# Patient Record
Sex: Female | Born: 1950 | Race: White | Hispanic: No | Marital: Single | Smoking: Former smoker
Health system: Southern US, Community
[De-identification: ages and names within clinical notes are randomized; demographics above are authoritative.]

## PROBLEM LIST (undated history)

## (undated) DIAGNOSIS — Z923 Personal history of irradiation: Secondary | ICD-10-CM

## (undated) DIAGNOSIS — I10 Essential (primary) hypertension: Secondary | ICD-10-CM

## (undated) DIAGNOSIS — R768 Other specified abnormal immunological findings in serum: Secondary | ICD-10-CM

## (undated) DIAGNOSIS — J40 Bronchitis, not specified as acute or chronic: Secondary | ICD-10-CM

## (undated) DIAGNOSIS — C50911 Malignant neoplasm of unspecified site of right female breast: Secondary | ICD-10-CM

## (undated) DIAGNOSIS — R0600 Dyspnea, unspecified: Secondary | ICD-10-CM

## (undated) DIAGNOSIS — J3089 Other allergic rhinitis: Secondary | ICD-10-CM

## (undated) DIAGNOSIS — I2089 Other forms of angina pectoris: Secondary | ICD-10-CM

## (undated) DIAGNOSIS — F329 Major depressive disorder, single episode, unspecified: Secondary | ICD-10-CM

## (undated) DIAGNOSIS — K219 Gastro-esophageal reflux disease without esophagitis: Secondary | ICD-10-CM

## (undated) DIAGNOSIS — F419 Anxiety disorder, unspecified: Secondary | ICD-10-CM

## (undated) DIAGNOSIS — I739 Peripheral vascular disease, unspecified: Secondary | ICD-10-CM

## (undated) DIAGNOSIS — I208 Other forms of angina pectoris: Secondary | ICD-10-CM

## (undated) DIAGNOSIS — M1990 Unspecified osteoarthritis, unspecified site: Secondary | ICD-10-CM

## (undated) DIAGNOSIS — G459 Transient cerebral ischemic attack, unspecified: Secondary | ICD-10-CM

## (undated) DIAGNOSIS — C801 Malignant (primary) neoplasm, unspecified: Secondary | ICD-10-CM

## (undated) DIAGNOSIS — F32A Depression, unspecified: Secondary | ICD-10-CM

## (undated) HISTORY — PX: CHOLECYSTECTOMY: SHX55

## (undated) HISTORY — DX: Other specified abnormal immunological findings in serum: R76.8

## (undated) HISTORY — PX: BREAST EXCISIONAL BIOPSY: SUR124

## (undated) HISTORY — PX: BREAST SURGERY: SHX581

## (undated) HISTORY — PX: APPENDECTOMY: SHX54

---

## 2000-01-23 DIAGNOSIS — Z923 Personal history of irradiation: Secondary | ICD-10-CM

## 2000-01-23 HISTORY — DX: Personal history of irradiation: Z92.3

## 2000-01-23 HISTORY — PX: BREAST LUMPECTOMY: SHX2

## 2004-02-09 ENCOUNTER — Ambulatory Visit: Payer: Self-pay | Admitting: Internal Medicine

## 2004-03-06 ENCOUNTER — Ambulatory Visit: Payer: Self-pay | Admitting: Internal Medicine

## 2004-03-27 ENCOUNTER — Ambulatory Visit: Payer: Self-pay | Admitting: Internal Medicine

## 2004-04-13 ENCOUNTER — Ambulatory Visit: Payer: Self-pay | Admitting: Oncology

## 2004-04-22 ENCOUNTER — Ambulatory Visit: Payer: Self-pay | Admitting: Oncology

## 2004-10-12 ENCOUNTER — Ambulatory Visit: Payer: Self-pay | Admitting: Oncology

## 2004-10-22 ENCOUNTER — Ambulatory Visit: Payer: Self-pay | Admitting: Oncology

## 2005-04-09 ENCOUNTER — Ambulatory Visit: Payer: Self-pay | Admitting: Oncology

## 2005-04-22 ENCOUNTER — Ambulatory Visit: Payer: Self-pay | Admitting: Oncology

## 2005-05-22 ENCOUNTER — Ambulatory Visit: Payer: Self-pay | Admitting: Oncology

## 2005-10-05 ENCOUNTER — Ambulatory Visit: Payer: Self-pay | Admitting: Oncology

## 2006-07-29 ENCOUNTER — Ambulatory Visit: Payer: Self-pay | Admitting: Internal Medicine

## 2007-08-06 ENCOUNTER — Ambulatory Visit: Payer: Self-pay | Admitting: Internal Medicine

## 2008-09-13 ENCOUNTER — Ambulatory Visit: Payer: Self-pay | Admitting: Internal Medicine

## 2008-09-15 ENCOUNTER — Ambulatory Visit: Payer: Self-pay | Admitting: Internal Medicine

## 2009-09-27 ENCOUNTER — Ambulatory Visit: Payer: Self-pay | Admitting: Internal Medicine

## 2010-10-23 ENCOUNTER — Ambulatory Visit: Payer: Self-pay | Admitting: Internal Medicine

## 2011-10-29 ENCOUNTER — Ambulatory Visit: Payer: Self-pay | Admitting: Internal Medicine

## 2012-12-16 ENCOUNTER — Ambulatory Visit: Payer: Self-pay | Admitting: Internal Medicine

## 2013-07-14 ENCOUNTER — Emergency Department (HOSPITAL_COMMUNITY): Payer: BC Managed Care – PPO

## 2013-07-14 ENCOUNTER — Encounter (HOSPITAL_COMMUNITY): Payer: Self-pay | Admitting: Radiology

## 2013-07-14 ENCOUNTER — Inpatient Hospital Stay (HOSPITAL_COMMUNITY)
Admission: EM | Admit: 2013-07-14 | Discharge: 2013-07-18 | DRG: 640 | Disposition: A | Discharge status: Home / Self Care | Payer: BC Managed Care – PPO | Attending: Internal Medicine | Admitting: Internal Medicine

## 2013-07-14 DIAGNOSIS — E871 Hypo-osmolality and hyponatremia: Secondary | ICD-10-CM

## 2013-07-14 DIAGNOSIS — F10930 Alcohol use, unspecified with withdrawal, uncomplicated: Secondary | ICD-10-CM

## 2013-07-14 DIAGNOSIS — Z853 Personal history of malignant neoplasm of breast: Secondary | ICD-10-CM

## 2013-07-14 DIAGNOSIS — E876 Hypokalemia: Secondary | ICD-10-CM | POA: Diagnosis present

## 2013-07-14 DIAGNOSIS — R4701 Aphasia: Secondary | ICD-10-CM

## 2013-07-14 DIAGNOSIS — E870 Hyperosmolality and hypernatremia: Secondary | ICD-10-CM

## 2013-07-14 DIAGNOSIS — F10939 Alcohol use, unspecified with withdrawal, unspecified: Secondary | ICD-10-CM

## 2013-07-14 DIAGNOSIS — R4789 Other speech disturbances: Secondary | ICD-10-CM | POA: Diagnosis present

## 2013-07-14 DIAGNOSIS — F10230 Alcohol dependence with withdrawal, uncomplicated: Secondary | ICD-10-CM

## 2013-07-14 DIAGNOSIS — F17200 Nicotine dependence, unspecified, uncomplicated: Secondary | ICD-10-CM | POA: Diagnosis present

## 2013-07-14 DIAGNOSIS — F10239 Alcohol dependence with withdrawal, unspecified: Secondary | ICD-10-CM

## 2013-07-14 DIAGNOSIS — F1010 Alcohol abuse, uncomplicated: Secondary | ICD-10-CM

## 2013-07-14 DIAGNOSIS — G9340 Encephalopathy, unspecified: Secondary | ICD-10-CM | POA: Diagnosis present

## 2013-07-14 DIAGNOSIS — R569 Unspecified convulsions: Secondary | ICD-10-CM | POA: Diagnosis present

## 2013-07-14 DIAGNOSIS — F1020 Alcohol dependence, uncomplicated: Secondary | ICD-10-CM | POA: Diagnosis present

## 2013-07-14 DIAGNOSIS — Z9221 Personal history of antineoplastic chemotherapy: Secondary | ICD-10-CM

## 2013-07-14 DIAGNOSIS — R4182 Altered mental status, unspecified: Secondary | ICD-10-CM

## 2013-07-14 DIAGNOSIS — I10 Essential (primary) hypertension: Secondary | ICD-10-CM | POA: Diagnosis present

## 2013-07-14 HISTORY — DX: Malignant (primary) neoplasm, unspecified: C80.1

## 2013-07-14 HISTORY — DX: Essential (primary) hypertension: I10

## 2013-07-14 HISTORY — DX: Malignant neoplasm of unspecified site of right female breast: C50.911

## 2013-07-14 LAB — RAPID URINE DRUG SCREEN, HOSP PERFORMED
AMPHETAMINES: NOT DETECTED
Barbiturates: NOT DETECTED
Benzodiazepines: NOT DETECTED
COCAINE: NOT DETECTED
Opiates: NOT DETECTED
TETRAHYDROCANNABINOL: NOT DETECTED

## 2013-07-14 LAB — BASIC METABOLIC PANEL
BUN: 6 mg/dL (ref 6–23)
CO2: 18 mEq/L — ABNORMAL LOW (ref 19–32)
Calcium: 8.5 mg/dL (ref 8.4–10.5)
Chloride: 71 mEq/L — ABNORMAL LOW (ref 96–112)
Creatinine, Ser: 0.28 mg/dL — ABNORMAL LOW (ref 0.50–1.10)
Glucose, Bld: 95 mg/dL (ref 70–99)
POTASSIUM: 3.3 meq/L — AB (ref 3.7–5.3)
Sodium: 109 mEq/L — CL (ref 137–147)

## 2013-07-14 LAB — URINE MICROSCOPIC-ADD ON

## 2013-07-14 LAB — DIFFERENTIAL
BASOS PCT: 0 % (ref 0–1)
Basophils Absolute: 0 10*3/uL (ref 0.0–0.1)
EOS PCT: 0 % (ref 0–5)
Eosinophils Absolute: 0 10*3/uL (ref 0.0–0.7)
Lymphocytes Relative: 9 % — ABNORMAL LOW (ref 12–46)
Lymphs Abs: 0.7 10*3/uL (ref 0.7–4.0)
MONO ABS: 0.7 10*3/uL (ref 0.1–1.0)
Monocytes Relative: 9 % (ref 3–12)
NEUTROS ABS: 6.5 10*3/uL (ref 1.7–7.7)
Neutrophils Relative %: 82 % — ABNORMAL HIGH (ref 43–77)

## 2013-07-14 LAB — CBC
HEMATOCRIT: 37.9 % (ref 36.0–46.0)
Hemoglobin: 14 g/dL (ref 12.0–15.0)
MCH: 32.8 pg (ref 26.0–34.0)
MCHC: 37.9 g/dL — AB (ref 30.0–36.0)
MCV: 86.7 fL (ref 78.0–100.0)
Platelets: 253 10*3/uL (ref 150–400)
RBC: 4.37 MIL/uL (ref 3.87–5.11)
RDW: 12.3 % (ref 11.5–15.5)
WBC: 7.9 10*3/uL (ref 4.0–10.5)

## 2013-07-14 LAB — PROTIME-INR
INR: 0.98 (ref 0.00–1.49)
PROTHROMBIN TIME: 12.8 s (ref 11.6–15.2)

## 2013-07-14 LAB — COMPREHENSIVE METABOLIC PANEL
ALBUMIN: 4.1 g/dL (ref 3.5–5.2)
ALT: 31 U/L (ref 0–35)
AST: 45 U/L — ABNORMAL HIGH (ref 0–37)
Alkaline Phosphatase: 111 U/L (ref 39–117)
BUN: 7 mg/dL (ref 6–23)
CO2: 27 mEq/L (ref 19–32)
CREATININE: 0.35 mg/dL — AB (ref 0.50–1.10)
Calcium: 9.5 mg/dL (ref 8.4–10.5)
Chloride: <65 mEq/L — CL (ref 96–112)
GFR calc Af Amer: >90 mL/min (ref >90)
GFR calc non Af Amer: >90 mL/min (ref >90)
Glucose, Bld: 121 mg/dL — ABNORMAL HIGH (ref 70–99)
Potassium: 3.3 mEq/L — ABNORMAL LOW (ref 3.7–5.3)
Sodium: 105 mEq/L — CL (ref 137–147)
Total Bilirubin: 1 mg/dL (ref 0.3–1.2)
Total Protein: 7.7 g/dL (ref 6.0–8.3)

## 2013-07-14 LAB — URINALYSIS, ROUTINE W REFLEX MICROSCOPIC
BILIRUBIN URINE: NEGATIVE
Glucose, UA: NEGATIVE mg/dL
KETONES UR: 15 mg/dL — AB
Nitrite: NEGATIVE
PROTEIN: 100 mg/dL — AB
Specific Gravity, Urine: 1.016 (ref 1.005–1.030)
Urobilinogen, UA: 1 mg/dL (ref 0.0–1.0)
pH: 7 (ref 5.0–8.0)

## 2013-07-14 LAB — CREATININE, URINE, RANDOM: Creatinine, Urine: 49.72 mg/dL

## 2013-07-14 LAB — ETHANOL: Alcohol, Ethyl (B): <11 mg/dL (ref 0–11)

## 2013-07-14 LAB — I-STAT TROPONIN, ED: Troponin i, poc: 0 ng/mL (ref 0.00–0.08)

## 2013-07-14 LAB — APTT: APTT: 29 s (ref 24–37)

## 2013-07-14 LAB — SODIUM, URINE, RANDOM: Sodium, Ur: 36 mEq/L

## 2013-07-14 MED ORDER — SODIUM CHLORIDE 0.9 % IV BOLUS (SEPSIS)
1000.0000 mL | Freq: Once | INTRAVENOUS | Status: AC
Start: 2013-07-14 — End: 2013-07-14
  Administered 2013-07-14: 1000 mL via INTRAVENOUS

## 2013-07-14 MED ORDER — FOLIC ACID 5 MG/ML IJ SOLN
1.0000 mg | Freq: Every day | INTRAMUSCULAR | Status: DC
  Administered 2013-07-14 – 2013-07-18 (×5): 1 mg via INTRAVENOUS
  Filled 2013-07-14 (×5): qty 0.2

## 2013-07-14 MED ORDER — SODIUM CHLORIDE 0.9 % IV BOLUS (SEPSIS)
1000.0000 mL | Freq: Once | INTRAVENOUS | Status: AC
  Administered 2013-07-14: 1000 mL via INTRAVENOUS

## 2013-07-14 MED ORDER — HEPARIN SODIUM (PORCINE) 5000 UNIT/ML IJ SOLN
5000.0000 [IU] | Freq: Three times a day (TID) | INTRAMUSCULAR | Status: DC
  Administered 2013-07-14 – 2013-07-15 (×2): 5000 [IU] via SUBCUTANEOUS
  Filled 2013-07-14 (×5): qty 1

## 2013-07-14 MED ORDER — ALTEPLASE (STROKE) FULL DOSE INFUSION
0.9000 mg/kg | Freq: Once | INTRAVENOUS | Status: DC
  Filled 2013-07-14: qty 52

## 2013-07-14 MED ORDER — MIDAZOLAM HCL 2 MG/2ML IJ SOLN
INTRAMUSCULAR | Status: AC
  Administered 2013-07-14: 1 mg
  Filled 2013-07-14: qty 2

## 2013-07-14 MED ORDER — SODIUM CHLORIDE 3 % IV SOLN
INTRAVENOUS | Status: DC
Start: 2013-07-15 — End: 2013-07-14
  Filled 2013-07-14: qty 500

## 2013-07-14 MED ORDER — SODIUM CHLORIDE 0.9 % IV SOLN: 250.0000 mL | INTRAVENOUS | Status: DC | PRN

## 2013-07-14 MED ORDER — FENTANYL CITRATE 0.05 MG/ML IJ SOLN
50.0000 ug | Freq: Once | INTRAMUSCULAR | Status: AC
  Administered 2013-07-15: 50 ug via INTRAVENOUS
  Filled 2013-07-14: qty 2

## 2013-07-14 MED ORDER — THIAMINE HCL 100 MG/ML IJ SOLN
100.0000 mg | Freq: Once | INTRAMUSCULAR | Status: AC
  Administered 2013-07-14: 100 mg via INTRAVENOUS
  Filled 2013-07-14: qty 2

## 2013-07-14 MED ORDER — SODIUM CHLORIDE 3 % IV SOLN
INTRAVENOUS | Status: DC
  Filled 2013-07-14: qty 500

## 2013-07-14 MED ORDER — MIDAZOLAM HCL 2 MG/2ML IJ SOLN
1.0000 mg | Freq: Once | INTRAMUSCULAR | Status: AC
  Administered 2013-07-15: 1 mg via INTRAVENOUS
  Filled 2013-07-14: qty 2

## 2013-07-14 MED ORDER — DESMOPRESSIN ACETATE 4 MCG/ML IJ SOLN
1.0000 ug | Freq: Two times a day (BID) | INTRAMUSCULAR | Status: DC
  Administered 2013-07-15 (×2): 1 ug via INTRAVENOUS
  Filled 2013-07-14 (×4): qty 0.25

## 2013-07-14 MED ORDER — FENTANYL CITRATE 0.05 MG/ML IJ SOLN
INTRAMUSCULAR | Status: AC
  Administered 2013-07-14: 50 ug
  Filled 2013-07-14: qty 2

## 2013-07-14 MED ORDER — SODIUM CHLORIDE 3 % IV SOLN: INTRAVENOUS | Status: DC

## 2013-07-14 NOTE — ED Notes (Signed)
Critical care physian at bedside

## 2013-07-14 NOTE — ED Notes (Signed)
i-stat chem 8 result given to [facility]

## 2013-07-14 NOTE — ED Notes (Signed)
Pt to ED via [place] with c/o Code Stroke.  EMS reports pt was normal at  15:30 and then speech became garbled.

## 2013-07-14 NOTE — Procedures (Signed)
Central Venous Catheter Insertion Procedure Note VANIYAH LANSKY [id number] 01-Apr-1950  Procedure: Insertion of Central Venous Catheter Indications: Drug and/or fluid administration  Procedure Details Consent: Unable to obtain consent because of altered level of consciousness. Time Out: Verified patient identification, verified procedure, site/side was marked, verified correct patient position, special equipment/implants available, medications/allergies/relevent history reviewed, required imaging and test results available.  Performed  Maximum sterile technique was used including antiseptics, cap, gloves, gown, hand hygiene, mask and sheet. Skin prep: Chlorhexidine; local anesthetic administered A antimicrobial bonded/coated triple lumen catheter was placed in the left internal jugular vein using the Seldinger technique. Ultrasound guidance used.yes Catheter placed to 18 cm. Blood aspirated via all 3 ports and then flushed x 3. Line sutured x 2 and dressing applied.  Evaluation Blood flow good Complications: No apparent complications Patient did tolerate procedure well. Chest X-ray ordered to verify placement.  CXR: pending.  Georgann Housekeeper, ACNP [facility] Pager [phone] or [phone]   Baltazar Apo, MD, PhD 07/15/2013, 12:25 AM [facility] [phone] or if no answer [phone]

## 2013-07-14 NOTE — Code Documentation (Signed)
63 year old female presents to ED via EMS as code stroke.  EMS reports she was LSW at 1430 with sudden onset of garbled speech and not following commands.  They report prior to that she was asking for food.  No family present - unable to find any medical records in our system. Patient is aphasic and unable to tell us anything.  She is MAEx4 - strong - garbled speech - unable to follow commands. Has gaze preference to the left with complete  field cut - will turn head to voice from right side.  Initial NIHSS 12.  After contacting family they state she is heavy drinker - 6-12 beers per day - also add she has been sick with vomiting since Sunday - only had 2 beers since Sunday - unable to keep food down.  She was not "herself" at a family reunion on Saturday.  Called for tPA - Dr. Aram Beecham speaking with family who have just arrived.   After speaking with people who witnessed the event - they report she asked for something to eat - then they heard a noise - looked over at her sitting a table - her right hand was jerking and hitting the table - she was unable to speak and she had urine incontinence.  She is becoming less agiated - able to speak some words - still confused - able to follow a few simple commands - still with garbled speech.  Her Na results show level of 105.  Dr. Aram Beecham requests to hold tPA.  NIHSS 9.  Handoff to [employer].

## 2013-07-14 NOTE — H&P (Signed)
PULMONARY / CRITICAL CARE MEDICINE   Name: Joan [name] MRN: [id number] DOB: 10-09-1950    ADMISSION DATE:  07/14/2013 CONSULTATION DATE:  07/14/2013  REFERRING MD :  EDP PRIMARY SERVICE: PCCM  CHIEF COMPLAINT:  Hyponatremia  BRIEF PATIENT DESCRIPTION: 63 year old female with PMH of hypertension, breast Ca, and alcohol abuse presents to [facility] ED 6/23 with 3 day history of progressive AMS. In ED found to have Na 105. PCCM asked to see for ICU admission.  SIGNIFICANT EVENTS / STUDIES:  6/23 CT head - Linear hyperdensity not far from the left foramen of Monro. This could be a venous angioma or conceivably a thrombosed vessel involving the left anterior circulation. Consider MRI or CT angiography for further characterization. 6/23 MRI brain >>>  LINES / TUBES: PIV  CULTURES: None  ANTIBIOTICS: None  HISTORY OF PRESENT ILLNESS:  63 year old female with hx of HTN and breast Ca (resection and chemo, last dose reportedly 4 years ago). She is a current smoker and reportedly drinks 6-12 beers daily. 6/21 began to have some alterations in mental status in that she was fatigued, and quiet. This continued to worsen. 6/23 began to have slurred speech and was brought to ED. In ED was alert and intermittently following commands, she was noted to have slight gaze deviation to left and slight R sided weakness by EDP. She was hemodynamically stable, and received 2L boluses of NS. She went to CT which was abnormal as above. MRI ordered in ED. Chemistry resulted with sodium 105. PCCM asked to consult for ICU admission.   PAST MEDICAL HISTORY :  Past Medical History  Diagnosis Date  . Hypertension   . Cancer    Past Surgical History  Procedure Laterality Date  . Cholecystectomy    . Breast surgery     Prior to Admission medications   Medication Sig Start Date End Date Taking? Authorizing Provider  lisinopril (PRINIVIL,ZESTRIL) 20 MG tablet Take 20 mg by mouth daily. 07/02/13  Yes Historical  Provider, MD   No Known Allergies  FAMILY HISTORY:  No family history on file. SOCIAL HISTORY:  reports that she has been smoking.  She does not have any smokeless tobacco history on file. She reports that she drinks alcohol. She reports that she does not use illicit drugs.  REVIEW OF SYSTEMS:  Unable due to delerium  SUBJECTIVE:   VITAL SIGNS: Temp:  [98.6 F (37 C)] 98.6 F (37 C) (06/23 2019) Pulse Rate:  [84-86] 84 (06/23 1942) Resp:  [17-18] 18 (06/23 2110) BP: (142-163)/(83-147) 162/83 mmHg (06/23 2110) SpO2:  [98 %-100 %] 98 % (06/23 2110) Weight:  [57.607 kg (127 lb)] 57.607 kg (127 lb) (06/23 1852) HEMODYNAMICS:   VENTILATOR SETTINGS:   INTAKE / OUTPUT: Intake/Output     06/23 0701 - 06/24 0700   I.V. (mL/kg) 1000 (17.4)   Total Intake(mL/kg) 1000 (17.4)   Net +1000         PHYSICAL EXAMINATION: General:  Female resting in bed in NAD Neuro:  Spontaneously alert, disoriented, speech does not make sense HEENT:  /AT, PERRL, no JVD noted Cardiovascular:  RRR Lungs:  Clear anteriorly Abdomen:  Soft, non-tender Musculoskeletal:  No acute deformity, no edema noted Skin:  Intact  LABS:  CBC  Recent Labs Lab 07/14/13 1803  WBC 7.9  HGB 14.0  HCT 37.9  PLT 253   Coag's  Recent Labs Lab 07/14/13 1803  APTT 29  INR 0.98   BMET  Recent Labs Lab  07/14/13 1803  NA 105*  K 3.3*  CL <65*  CO2 27  BUN 7  CREATININE 0.35*  GLUCOSE 121*   Electrolytes  Recent Labs Lab 07/14/13 1803  CALCIUM 9.5   Sepsis Markers No results found for this basename: LATICACIDVEN, PROCALCITON, O2SATVEN,  in the last 168 hours ABG No results found for this basename: PHART, PCO2ART, PO2ART,  in the last 168 hours Liver Enzymes  Recent Labs Lab 07/14/13 1803  AST 45*  ALT 31  ALKPHOS 111  BILITOT 1.0  ALBUMIN 4.1   Cardiac Enzymes No results found for this basename: TROPONINI, PROBNP,  in the last 168 hours Glucose No results found for this  basename: GLUCAP,  in the last 168 hours  Drug Screen and ETOH level negative on admission.  Imaging Ct Head Wo Contrast  07/14/2013   CLINICAL DATA:  Left 4 days.  Vomiting.  Slurred speech.  EXAM: CT HEAD WITHOUT CONTRAST  TECHNIQUE: Contiguous axial images were obtained from the base of the skull through the vertex without intravenous contrast.  COMPARISON:  Report from 11/26/2002  FINDINGS: Small hyperdense structure medially in the left frontal lobe near the foramen of Monro as on image 11 of series 4.  The brainstem, cerebellum, cerebral peduncle is, thalami, basal ganglia, ventricular system, and basilar cisterns appear otherwise unremarkable.  Periventricular white matter and corona radiata hypodensities favor chronic ischemic microvascular white matter disease. No specific parenchymal signs acute hemorrhage, CVA, or mass.  IMPRESSION: 1. Bili finding of note is a linear hyperdensity not far from the left foramen of Monro. This could be a venous angioma or conceivably a thrombosed vessel involving the left anterior circulation. I doubt this is local blood products. Consider MRI or CT angiography for further characterization. 2. Periventricular white matter and corona radiata hypodensities favor chronic ischemic microvascular white matter disease.  These results were called by telephone at the time of interpretation on 07/14/2013 at 6:41 PM to Dr. Armida Sans, who verbally acknowledged these results.   Electronically Signed   By: Sherryl Barters M.D.   On: 07/14/2013 18:44   Dg Chest Portable 1 View  07/14/2013   CLINICAL DATA:  Code stroke  EXAM: PORTABLE CHEST - 1 VIEW  COMPARISON:  None.  FINDINGS: The heart size and mediastinal contours are within normal limits. Both lungs are clear. The visualized skeletal structures are unremarkable.  IMPRESSION: No active disease.   Electronically Signed   By: Lucienne Capers M.D.   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  BMP q2 hours Desmopressin BID Free water restriction Na Goal 109-111 by 6/24 @ 2200 Check serum osmo, urine sodium  GASTROINTESTINAL A:   ETOH abuse (12 pack per day) GI ppx  P:   PPI Free water restriction  HEMATOLOGIC A:   Hx breast Ca VTE ppx  P:  Follow CBC SQ Heparin  INFECTIOUS A:   No acute evidence of infection  P:   Follow WBC and fever curves  ENDOCRINE A:   No acute issues    P:   CBG and  SSI  NEUROLOGIC A:   Acute encephalopathy 2nd to hyponatremia High risk for ETOH withdrawal  P:   CIWA assessment Monitor  Georgann Housekeeper, ACNP [facility] Pager [phone] or [phone]   I have personally obtained a history, examined the patient, evaluated laboratory and imaging results, formulated the assessment and plan and placed orders.  CRITICAL CARE: The patient is critically ill with multiple organ systems failure and requires high complexity decision making for assessment and support, frequent evaluation and titration of therapies, application of advanced monitoring technologies and extensive interpretation of multiple databases. Critical Care Time devoted to patient care services described in this note is 60 minutes.   Attending MD:  I have seen and examined the patient with P. Hoffman. She was seen by me 6/23. This note edited and signed 6/24. I agree with the data, assessment and plans above.  She has severe hyponatremia and encephalopathy. No evidence seizures thus far. Goal Na correction is 111 until ~18:00 on 6/24. She is stable from hemodynamic and resp  standpoint currently. Not she is at high risk for EtOH withdrawal.   Baltazar Apo, MD, PhD 07/15/2013, 12:15 AM [facility] [phone] or if no answer [phone]

## 2013-07-14 NOTE — ED Notes (Signed)
Pt remains restless attempts to sit upright and grasps at lines able to redirect pt to lie down and moved hands away from lines

## 2013-07-14 NOTE — Consult Note (Signed)
Referring Physician: ED    Chief Complaint: CODE STROKE confusion, aphasia.  HPI:                                                                                                                                         Joan [name] is an 63 y.o. female with a past medical history significant for HTN, alcoholism, and breast cancer, brought to the ED by ambulance as a code stroke due to the above stated symptoms. Patient has been sick for the past couple of days vomiting, no eating well, and no able to drink as she normally does.  Family said that she has been confused, no being herself, and today she was at the group home and noted to be confused, with a blank stare, having repetitive jerkiness of the right hand, and was incontinent of urine. Ambulance was called and they found her with garbled speech, no able to follow commands. Initial NIHSS 12. CT brain showed no acute intracranial abnormality. Na 105 Date last known well: 07/14/13 Time last known well: 230 pm  tPA Given: no, severe hyponatremia with seizures. NIHSS: 12   Past Medical History  Diagnosis Date  . Hypertension   . Cancer     Past Surgical History  Procedure Laterality Date  . Cholecystectomy    . Breast surgery      No family history on file. Social History:  reports that she has been smoking.  She does not have any smokeless tobacco history on file. She reports that she drinks alcohol. She reports that she does not use illicit drugs.  Allergies: No Known Allergies  Medications:                                                                                                                           I have reviewed the patient's current medications.  ROS:  Unable to obtain due to mental status  History obtained from chart review and family  Physical exam: no apparent distress.  Blood pressure 163/147, pulse 86, temperature 98.6 F (37 C), temperature source Oral, resp. rate 18, height _0  (1.651 m), weight 57.607 kg (127 lb), SpO2 99.00%. Head: normocephalic. Neck: supple, no bruits, no JVD. Cardiac: no murmurs. Lungs: clear. Abdomen: soft, no tender, no mass. Extremities: no edema. Neurologic Examination:                                                                                                      Mental Status: Awake, follows simple commands inconsistently. Cranial Nerves: II: Discs flat bilaterally; Visual fields grossly normal, pupils equal, round, reactive to light and accommodation III,IV, VI: ptosis not present, extra-ocular motions intact bilaterally V,VII: smile symmetric, facial light touch sensation normal bilaterally VIII: hearing normal bilaterally IX,X: gag reflex present XI: bilateral shoulder shrug XII: midline tongue extension without atrophy or fasciculations Motor: Moves all limbs symmetrically Tone and bulk:normal tone throughout; no atrophy noted Sensory: reacts to pain bilaterally Deep Tendon Reflexes:  Right: Upper Extremity   Left: Upper extremity   biceps (C-5 to C-6) 2/4   biceps (C-5 to C-6) 2/4 tricep (C7) 2/4    triceps (C7) 2/4 Brachioradialis (C6) 2/4  Brachioradialis (C6) 2/4  Lower Extremity Lower Extremity  quadriceps (L-2 to L-4) 2/4   quadriceps (L-2 to L-4) 2/4 Achilles (S1) 2/4   Achilles (S1) 2/4 Plantars: Right: downgoing   Left: downgoing Cerebellar: Unable to test Gait: unable to test No meningeal signs.    Results for orders placed during the hospital encounter of 07/14/13 (from the past 48 hour(s))  ETHANOL     Status: None   Collection Time    07/14/13  6:03 PM      Result Value Ref Range   Alcohol, Ethyl (B) <11  0 - 11 mg/dL   Comment:            LOWEST DETECTABLE LIMIT FOR     SERUM ALCOHOL IS 11 mg/dL     FOR MEDICAL PURPOSES ONLY  PROTIME-INR     Status: None   Collection Time     07/14/13  6:03 PM      Result Value Ref Range   Prothrombin Time 12.8  11.6 - 15.2 seconds   INR 0.98  0.00 - 1.49  APTT     Status: None   Collection Time    07/14/13  6:03 PM      Result Value Ref Range   aPTT 29  24 - 37 seconds  CBC     Status: Abnormal   Collection Time    07/14/13  6:03 PM      Result Value Ref Range   WBC 7.9  4.0 - 10.5 K/uL   RBC 4.37  3.87 - 5.11 MIL/uL   Hemoglobin 14.0  12.0 - 15.0 g/dL   HCT 37.9  36.0 - 46.0 %   MCV 86.7  78.0 - 100.0 fL   MCH 32.8  26.0 - 34.0 pg   MCHC 37.9 (*) 30.0 - 36.0  g/dL   Comment: RULED OUT INTERFERING SUBSTANCES   RDW 12.3  11.5 - 15.5 %   Platelets 253  150 - 400 K/uL  DIFFERENTIAL     Status: Abnormal   Collection Time    07/14/13  6:03 PM      Result Value Ref Range   Neutrophils Relative % 82 (*) 43 - 77 %   Neutro Abs 6.5  1.7 - 7.7 K/uL   Lymphocytes Relative 9 (*) 12 - 46 %   Lymphs Abs 0.7  0.7 - 4.0 K/uL   Monocytes Relative 9  3 - 12 %   Monocytes Absolute 0.7  0.1 - 1.0 K/uL   Eosinophils Relative 0  0 - 5 %   Eosinophils Absolute 0.0  0.0 - 0.7 K/uL   Basophils Relative 0  0 - 1 %   Basophils Absolute 0.0  0.0 - 0.1 K/uL  COMPREHENSIVE METABOLIC PANEL     Status: Abnormal   Collection Time    07/14/13  6:03 PM      Result Value Ref Range   Sodium 105 (*) 137 - 147 mEq/L   Comment: CRITICAL RESULT CALLED TO, READ BACK BY AND VERIFIED WITH:     M SCRUGGS,RN 1902 07/14/13 WBOND   Potassium 3.3 (*) 3.7 - 5.3 mEq/L   Chloride <65 (*) 96 - 112 mEq/L   Comment: CRITICAL RESULT CALLED TO, READ BACK BY AND VERIFIED WITH:     M SCRUGGS,RN 1902 07/14/13 WBOND   CO2 27  19 - 32 mEq/L   Glucose, Bld 121 (*) 70 - 99 mg/dL   BUN 7  6 - 23 mg/dL   Creatinine, Ser 0.35 (*) 0.50 - 1.10 mg/dL   Calcium 9.5  8.4 - 10.5 mg/dL   Total Protein 7.7  6.0 - 8.3 g/dL   Albumin 4.1  3.5 - 5.2 g/dL   AST 45 (*) 0 - 37 U/L   ALT 31  0 - 35 U/L   Alkaline Phosphatase 111  39 - 117 U/L   Total Bilirubin 1.0  0.3 - 1.2  mg/dL   GFR calc non Af Amer >90  >90 mL/min   GFR calc Af Amer >90  >90 mL/min   Comment: (NOTE)     The eGFR has been calculated using the CKD EPI equation.     This calculation has not been validated in all clinical situations.     eGFR's persistently <90 mL/min signify possible Chronic Kidney     Disease.  Randolm Idol, ED     Status: None   Collection Time    07/14/13  6:07 PM      Result Value Ref Range   Troponin i, poc 0.00  0.00 - 0.08 ng/mL   Comment 3            Comment: Due to the release kinetics of cTnI,     a negative result within the first hours     of the onset of symptoms does not rule out     myocardial infarction with certainty.     If myocardial infarction is still suspected,     repeat the test at appropriate intervals.   Ct Head Wo Contrast  07/14/2013   CLINICAL DATA:  Left 4 days.  Vomiting.  Slurred speech.  EXAM: CT HEAD WITHOUT CONTRAST  TECHNIQUE: Contiguous axial images were obtained from the base of the skull through the vertex without intravenous contrast.  COMPARISON:  Report from 11/26/2002  FINDINGS: Small hyperdense structure medially in the left frontal lobe near the foramen of Monro as on image 11 of series 4.  The brainstem, cerebellum, cerebral peduncle is, thalami, basal ganglia, ventricular system, and basilar cisterns appear otherwise unremarkable.  Periventricular white matter and corona radiata hypodensities favor chronic ischemic microvascular white matter disease. No specific parenchymal signs acute hemorrhage, CVA, or mass.  IMPRESSION: 1. Bili finding of note is a linear hyperdensity not far from the left foramen of Monro. This could be a venous angioma or conceivably a thrombosed vessel involving the left anterior circulation. I doubt this is local blood products. Consider MRI or CT angiography for further characterization. 2. Periventricular white matter and corona radiata hypodensities favor chronic ischemic microvascular white matter  disease.  These results were called by telephone at the time of interpretation on 07/14/2013 at 6:41 PM to Dr. Armida Sans, who verbally acknowledged these results.   Electronically Signed   By: Sherryl Barters M.D.   On: 07/14/2013 18:44     Assessment: 63 y.o. female with altered mental status, severe hyponatremia. Most likely had a provoked seizure at home in the setting of severe hyponatremia and withdraw from alcohol. History of breast cancer, can not entirely exclude brain mets. Admit to medicine. EEG to ruled out NCSE. MRI brain. Correct hyponatremia. Will follow up.  Dorian Pod, MD [facility] [phone]  07/14/2013, 7:08 PM

## 2013-07-14 NOTE — ED Notes (Signed)
Pt gold necklace placed in urine cup and pt sticker placed on cup- remains at bedside.

## 2013-07-14 NOTE — ED Provider Notes (Signed)
63 year old female was brought in to the ED as a code stroke. Initial report was that she was talking normally and then had sudden onset at 3:30 PM of garbled speech. However, once family arrived, a more complete history was obtained. She was at a family function in 3 days ago at which time she was unusually quiet. Diet she was taken home and slept most of the next day. Since then, she has been weak and somewhat shaky. Speech got considerably worse during the course of today. Family does relate that she drinks 6-12 beers a day and smokes at least a pack of cigarettes a day. She has a history of breast cancer which was treated with resection of tumor and chemotherapy and she has been cancer free for 4 years. Family states that she does not use illicit drugs. On exam, she is awake and alert and will follow some commands but will not consistently follow commands. There is a slight gaze preference to the left and she is slightly weaker on right compared with the left. Lungs are clear and heart has regular rate and rhythm. There no carotid bruits. Sodium has come back 105 without any other significant findings. She does take some medication for blood pressure but is not sure what it is. Severe hyponatremia probably accounts for all of her symptoms although there certainly could be some component of Wernicke's encephalopathy. She'll be given thiamine and saline. A urine sodium will be checked to evaluate for SIADH. CT did not show any definite pathology but she will be sent for MRI to make sure that there are no cerebral metastases that might account for some of her symptoms. She will need to be admitted to the medicine service.  CRITICAL CARE Performed by: Delora Fuel Total critical care time: 55 minutes Critical care time was exclusive of separately billable procedures and treating other patients. Critical care was necessary to treat or prevent imminent or life-threatening deterioration. Critical care was time  spent personally by me on the following activities: development of treatment plan with patient and/or surrogate as well as nursing, discussions with consultants, evaluation of patient's response to treatment, examination of patient, obtaining history from patient or surrogate, ordering and performing treatments and interventions, ordering and review of laboratory studies, ordering and review of radiographic studies, pulse oximetry and re-evaluation of patient's condition.  I saw and evaluated the patient, reviewed the resident's note and I agree with the findings and plan.   EKG Interpretation   Date/Time:  Tuesday July 14 2013 18:33:23 EDT Ventricular Rate:  93 PR Interval:  219 QRS Duration: 93 QT Interval:  413 QTC Calculation: 514 R Axis:   85 Text Interpretation:  Sinus rhythm Prolonged PR interval Biatrial  enlargement Borderline right axis deviation Repol abnrm suggests ischemia,  lateral leads Prolonged QT interval Baseline wander in lead(s) I III aVL  No old tracing to compare Confirmed by [facility]  MD, DAVID ([id number]) on  07/14/2013 6:41:00 PM         Delora Fuel, MD 49/20/10 0712

## 2013-07-14 NOTE — ED Notes (Signed)
Dr. Gardner at bedside 

## 2013-07-14 NOTE — ED Notes (Addendum)
Hattie Sister [phone]) Please call for any changes or if family is needed

## 2013-07-14 NOTE — ED Notes (Signed)
PT RESTLESS IN BED REQUIRING FREQUENT ASSISTANCE WITH REPOSITIONING LIGHTS OFF IN ROOM QUIET MINIMAL DISTRACTIONS

## 2013-07-14 NOTE — ED Provider Notes (Signed)
CSN: [id number]     Arrival date & time 07/14/13  1800 History   None    Chief Complaint  Patient presents with  . Code Stroke     (Consider location/radiation/quality/duration/timing/severity/associated sxs/prior Treatment) Patient is a 63 y.o. female presenting with altered mental status and general illness. The history is provided by the EMS personnel. The history is limited by the condition of the patient and the absence of a caregiver.  Altered Mental Status Presenting symptoms comment:  Speech change Severity:  Severe Most recent episode:  Today Episode history:  Single Timing:  Constant Progression:  Worsening Chronicity:  New Illness Severity:  Severe Onset quality:  Sudden Timing:  Constant Progression:  Unable to specify Chronicity:  New   63 yo F presents as code stroke. Last normal at 15:30 today (arrived at ~1800). Had just asked for bowl of soup. Then started having slurred speech. Garbled incomprehensible speech with EMS. Difficulty following simple commands.  H/o HTN. No other reported history.     Past Medical History  Diagnosis Date  . Hypertension   . Cancer    Past Surgical History  Procedure Laterality Date  . Cholecystectomy    . Breast surgery     No family history on file. History  Substance Use Topics  . Smoking status: Current Every Day Smoker  . Smokeless tobacco: Not on file  . Alcohol Use: Yes     Comment: Beer heavy everyday   OB History   Grav Para Term Preterm Abortions TAB SAB Ect Mult Living                 Review of Systems  Unable to perform ROS: Mental status change      Allergies  Review of patient's allergies indicates no known allergies.  Home Medications   Prior to Admission medications   Medication Sig Start Date End Date Taking? Authorizing Savion Washam  lisinopril (PRINIVIL,ZESTRIL) 20 MG tablet Take 20 mg by mouth daily. 07/02/13  Yes Historical Bhavya Eschete, MD   BP 139/105  Pulse 85  Temp(Src) 98.6 F (37  C) (Oral)  Resp 22  Ht 5\' 5"  (1.651 m)  Wt 127 lb (57.607 kg)  BMI 21.13 kg/m2  SpO2 99% Physical Exam  Vitals reviewed. Constitutional: She appears well-developed and well-nourished.  Sitting up in EMS stretcher looking around room. Incomprehensible speech.   HENT:  Head: Normocephalic and atraumatic.  Eyes: Pupils are equal, round, and reactive to light.  Cardiovascular: Normal rate and regular rhythm.   Pulmonary/Chest: Effort normal. No respiratory distress.  Abdominal: Soft. There is no tenderness.  Neurological: She is alert. GCS eye subscore is 4. GCS verbal subscore is 2. GCS motor subscore is 5.  Intermittently follows commands to squeeze hands.  Skin: Skin is warm and dry.    ED Course  Procedures (including critical care time) Labs Review Labs Reviewed  CBC - Abnormal; Notable for the following:    MCHC 37.9 (*)    All other components within normal limits  DIFFERENTIAL - Abnormal; Notable for the following:    Neutrophils Relative % 82 (*)    Lymphocytes Relative 9 (*)    All other components within normal limits  COMPREHENSIVE METABOLIC PANEL - Abnormal; Notable for the following:    Sodium 105 (*)    Potassium 3.3 (*)    Chloride <65 (*)    Glucose, Bld 121 (*)    Creatinine, Ser 0.35 (*)    AST 45 (*)    All  other components within normal limits  URINALYSIS, ROUTINE W REFLEX MICROSCOPIC - Abnormal; Notable for the following:    APPearance CLOUDY (*)    Hgb urine dipstick MODERATE (*)    Ketones, ur 15 (*)    Protein, ur 100 (*)    Leukocytes, UA MODERATE (*)    All other components within normal limits  URINE MICROSCOPIC-ADD ON - Abnormal; Notable for the following:    Squamous Epithelial / LPF FEW (*)    Bacteria, UA MANY (*)    All other components within normal limits  BASIC METABOLIC PANEL - Abnormal; Notable for the following:    Sodium 109 (*)    Potassium 3.3 (*)    Chloride 71 (*)    CO2 18 (*)    Creatinine, Ser 0.28 (*)    All other  components within normal limits  MRSA PCR SCREENING  ETHANOL  PROTIME-INR  APTT  URINE RAPID DRUG SCREEN (HOSP PERFORMED)  SODIUM, URINE, RANDOM  CREATININE, URINE, RANDOM  OSMOLALITY  OSMOLALITY, URINE  BASIC METABOLIC PANEL  BASIC METABOLIC PANEL  BASIC METABOLIC PANEL  BASIC METABOLIC PANEL  BASIC METABOLIC PANEL  BASIC METABOLIC PANEL  CBC  BASIC METABOLIC PANEL  BASIC METABOLIC PANEL  BASIC METABOLIC PANEL  BASIC METABOLIC PANEL  BASIC METABOLIC PANEL  BASIC METABOLIC PANEL  I-STAT CHEM 8, ED  I-STAT TROPOININ, ED  I-STAT TROPOININ, ED    Imaging Review Ct Head Wo Contrast  07/14/2013   CLINICAL DATA:  Left 4 days.  Vomiting.  Slurred speech.  EXAM: CT HEAD WITHOUT CONTRAST  TECHNIQUE: Contiguous axial images were obtained from the base of the skull through the vertex without intravenous contrast.  COMPARISON:  Report from 11/26/2002  FINDINGS: Small hyperdense structure medially in the left frontal lobe near the foramen of Monro as on image 11 of series 4.  The brainstem, cerebellum, cerebral peduncle is, thalami, basal ganglia, ventricular system, and basilar cisterns appear otherwise unremarkable.  Periventricular white matter and corona radiata hypodensities favor chronic ischemic microvascular white matter disease. No specific parenchymal signs acute hemorrhage, CVA, or mass.  IMPRESSION: 1. Bili finding of note is a linear hyperdensity not far from the left foramen of Monro. This could be a venous angioma or conceivably a thrombosed vessel involving the left anterior circulation. I doubt this is local blood products. Consider MRI or CT angiography for further characterization. 2. Periventricular white matter and corona radiata hypodensities favor chronic ischemic microvascular white matter disease.  These results were called by telephone at the time of interpretation on 07/14/2013 at 6:41 PM to Dr. Armida Sans, who verbally acknowledged these results.   Electronically Signed   By:  Sherryl Barters M.D.   On: 07/14/2013 18:44   Dg Chest Port 1 View  07/15/2013   CLINICAL DATA:  Central venous line insertion.  EXAM: PORTABLE CHEST - 1 VIEW  COMPARISON:  07/14/2013  FINDINGS: Interval placement of a left central venous catheter with tip over the mid SVC region. No pneumothorax. Heart size and pulmonary vascularity are normal. No focal airspace disease or consolidations found in the lungs.  IMPRESSION: Left central venous catheter tip overlies the mid SVC region. No pneumothorax.   Electronically Signed   By: Lucienne Capers M.D.   On: 07/15/2013 00:26   Dg Chest Portable 1 View  07/14/2013   CLINICAL DATA:  Code stroke  EXAM: PORTABLE CHEST - 1 VIEW  COMPARISON:  None.  FINDINGS: The heart size and mediastinal contours are within normal  limits. Both lungs are clear. The visualized skeletal structures are unremarkable.  IMPRESSION: No active disease.   Electronically Signed   By: Lucienne Capers M.D.   On: 07/14/2013 21:14     EKG Interpretation   Date/Time:  Tuesday July 14 2013 18:33:23 EDT Ventricular Rate:  93 PR Interval:  219 QRS Duration: 93 QT Interval:  413 QTC Calculation: 514 R Axis:   85 Text Interpretation:  Sinus rhythm Prolonged PR interval Biatrial  enlargement Borderline right axis deviation Repol abnrm suggests ischemia,  lateral leads Prolonged QT interval Baseline wander in lead(s) I III aVL  No old tracing to compare Confirmed by [facility]  MD, DAVID ([id number]) on  07/14/2013 6:41:00 PM      MDM   Final diagnoses:  Hyponatremia syndrome  Alcohol withdrawal, uncomplicated    63 yo F presents as code stroke. Last normal 3:30pm (Later clarified by family that patient has not been herself over the past couple days) Airway intact. Taken to CT scan.  CT head as above. MRI pending.  Patient found to be hyponatremic. Given NS bolus.  Patient admitted to ICU. ICU to place central line and give hypertonic saline.  Symptoms likely related to severe  hyponatremia. Unclear source of low na. Stroke considered less likely.  Labs and imaging reviewed by myself and considered in medical decision making if ordered. Imaging interpreted by radiology.   Discussed case with Dr. Roxanne Mins who is in agreement with assessment and plan.      Bonnita Hollow, MD 07/15/13 [phone]

## 2013-07-15 ENCOUNTER — Inpatient Hospital Stay (HOSPITAL_COMMUNITY): Payer: BC Managed Care – PPO

## 2013-07-15 DIAGNOSIS — R569 Unspecified convulsions: Secondary | ICD-10-CM

## 2013-07-15 DIAGNOSIS — F1010 Alcohol abuse, uncomplicated: Secondary | ICD-10-CM

## 2013-07-15 DIAGNOSIS — G9340 Encephalopathy, unspecified: Secondary | ICD-10-CM

## 2013-07-15 DIAGNOSIS — I10 Essential (primary) hypertension: Secondary | ICD-10-CM | POA: Diagnosis present

## 2013-07-15 LAB — BASIC METABOLIC PANEL
BUN: 5 mg/dL — ABNORMAL LOW (ref 6–23)
BUN: 5 mg/dL — ABNORMAL LOW (ref 6–23)
BUN: 5 mg/dL — ABNORMAL LOW (ref 6–23)
BUN: 6 mg/dL (ref 6–23)
BUN: 6 mg/dL (ref 6–23)
BUN: 5 mg/dL — ABNORMAL LOW (ref 6–23)
CALCIUM: 8.5 mg/dL (ref 8.4–10.5)
CALCIUM: 8.5 mg/dL (ref 8.4–10.5)
CHLORIDE: 72 meq/L — AB (ref 96–112)
CHLORIDE: 76 meq/L — AB (ref 96–112)
CHLORIDE: 78 meq/L — AB (ref 96–112)
CO2: 24 mEq/L (ref 19–32)
CO2: 25 mEq/L (ref 19–32)
CO2: 24 mEq/L (ref 19–32)
CO2: 24 meq/L (ref 19–32)
CO2: 25 meq/L (ref 19–32)
CO2: 25 meq/L (ref 19–32)
CREATININE: 0.37 mg/dL — AB (ref 0.50–1.10)
CREATININE: 0.35 mg/dL — AB (ref 0.50–1.10)
Calcium: 8.5 mg/dL (ref 8.4–10.5)
Calcium: 8.6 mg/dL (ref 8.4–10.5)
Calcium: 8.6 mg/dL (ref 8.4–10.5)
Calcium: 8.4 mg/dL (ref 8.4–10.5)
Chloride: 73 mEq/L — ABNORMAL LOW (ref 96–112)
Chloride: 72 mEq/L — ABNORMAL LOW (ref 96–112)
Chloride: 76 mEq/L — ABNORMAL LOW (ref 96–112)
Creatinine, Ser: 0.32 mg/dL — ABNORMAL LOW (ref 0.50–1.10)
Creatinine, Ser: 0.4 mg/dL — ABNORMAL LOW (ref 0.50–1.10)
Creatinine, Ser: 0.42 mg/dL — ABNORMAL LOW (ref 0.50–1.10)
Creatinine, Ser: 0.38 mg/dL — ABNORMAL LOW (ref 0.50–1.10)
GFR calc Af Amer: >90 mL/min (ref >90)
GFR calc Af Amer: >90 mL/min (ref >90)
GFR calc non Af Amer: >90 mL/min (ref >90)
GFR calc non Af Amer: >90 mL/min (ref >90)
GFR calc non Af Amer: >90 mL/min (ref >90)
GFR calc non Af Amer: >90 mL/min (ref >90)
GLUCOSE: 88 mg/dL (ref 70–99)
Glucose, Bld: 88 mg/dL (ref 70–99)
Glucose, Bld: 85 mg/dL (ref 70–99)
Glucose, Bld: 90 mg/dL (ref 70–99)
Glucose, Bld: 85 mg/dL (ref 70–99)
Glucose, Bld: 105 mg/dL — ABNORMAL HIGH (ref 70–99)
POTASSIUM: 3.9 meq/L (ref 3.7–5.3)
POTASSIUM: 2.9 meq/L — AB (ref 3.7–5.3)
POTASSIUM: 3.3 meq/L — AB (ref 3.7–5.3)
POTASSIUM: 3.3 meq/L — AB (ref 3.7–5.3)
Potassium: 2.7 mEq/L — CL (ref 3.7–5.3)
Potassium: 3.2 mEq/L — ABNORMAL LOW (ref 3.7–5.3)
SODIUM: 111 meq/L — AB (ref 137–147)
SODIUM: 112 meq/L — AB (ref 137–147)
SODIUM: 112 meq/L — AB (ref 137–147)
Sodium: 115 mEq/L — CL (ref 137–147)
Sodium: 114 mEq/L — CL (ref 137–147)
Sodium: 115 mEq/L — CL (ref 137–147)

## 2013-07-15 LAB — CBC
HEMATOCRIT: 34.6 % — AB (ref 36.0–46.0)
Hemoglobin: 13.1 g/dL (ref 12.0–15.0)
MCH: 33.2 pg (ref 26.0–34.0)
MCHC: 37.9 g/dL — ABNORMAL HIGH (ref 30.0–36.0)
MCV: 87.8 fL (ref 78.0–100.0)
PLATELETS: 244 10*3/uL (ref 150–400)
RBC: 3.94 MIL/uL (ref 3.87–5.11)
RDW: 12.4 % (ref 11.5–15.5)
WBC: 9.3 10*3/uL (ref 4.0–10.5)

## 2013-07-15 LAB — OSMOLALITY: Osmolality: 227 mosm/kg — ABNORMAL LOW (ref 275–300)

## 2013-07-15 LAB — MRSA PCR SCREENING: MRSA by PCR: NEGATIVE

## 2013-07-15 LAB — OSMOLALITY, URINE: Osmolality, Ur: 374 mosm/kg — ABNORMAL LOW (ref 390–1090)

## 2013-07-15 MED ORDER — POTASSIUM CHLORIDE 10 MEQ/50ML IV SOLN
10.0000 meq | INTRAVENOUS | Status: AC
  Administered 2013-07-15 (×5): 10 meq via INTRAVENOUS
  Filled 2013-07-15 (×5): qty 50

## 2013-07-15 MED ORDER — PNEUMOCOCCAL VAC POLYVALENT 25 MCG/0.5ML IJ INJ
0.5000 mL | INJECTION | INTRAMUSCULAR | Status: DC
  Filled 2013-07-15: qty 0.5

## 2013-07-15 MED ORDER — POTASSIUM CHLORIDE IN NACL 40-0.9 MEQ/L-% IV SOLN
INTRAVENOUS | Status: DC
  Administered 2013-07-15 – 2013-07-17 (×4): 50 mL/h via INTRAVENOUS
  Filled 2013-07-15 (×8): qty 1000

## 2013-07-15 MED ORDER — ENOXAPARIN SODIUM 40 MG/0.4ML ~~LOC~~ SOLN
40.0000 mg | SUBCUTANEOUS | Status: DC
  Administered 2013-07-15 – 2013-07-17 (×3): 40 mg via SUBCUTANEOUS
  Filled 2013-07-15 (×4): qty 0.4

## 2013-07-15 MED ORDER — SODIUM CHLORIDE 0.9 % IV SOLN
INTRAVENOUS | Status: DC
  Administered 2013-07-15: 03:00:00 via INTRAVENOUS

## 2013-07-15 MED ORDER — POTASSIUM CHLORIDE CRYS ER 20 MEQ PO TBCR
40.0000 meq | EXTENDED_RELEASE_TABLET | Freq: Once | ORAL | Status: AC
  Administered 2013-07-15: 40 meq via ORAL
  Filled 2013-07-15: qty 2

## 2013-07-15 NOTE — Progress Notes (Signed)
Subjective: Patient remains confused and intermittently agitated. No seizure activity noted since admission.  Objective: Current vital signs: BP 147/80  Pulse 74  Temp(Src) 99.4 F (37.4 C) (Oral)  Resp 19  Ht 5\' 4"  (2.725 m)  Wt 55.7 kg (122 lb 12.7 oz)  BMI 21.07 kg/m2  SpO2 100%  Neurologic Exam: Alert and disoriented to correct age as well as place. She was able to follow commands fairly well. Pupils were equal and reacted normally to light. Extraocular movements were full and conjugate. Visual fields were intact and normal. No facial weakness noted. Strength and muscle tone normal throughout. Deep tendon reflexes are absent in lower extremities. Plantars pulses reflexes bilaterally.  Medications: I have reviewed the patient's current medications.  Assessment/Plan: Persistent altered mental status due primarily to marked hyponatremia. Patient may well have experienced a focal seizure prior to admission. No further seizure activity reported. EEG is pending. Patient's altered mental status may in part be due to alcohol withdrawal, in addition to marked hyponatremia.  Recommendations: No changes in current management recommended, including slow correction of serum sodium as well as alcohol withdrawal precautions. MRI of the brain can be deferred. No anticonvulsant medication recommended unless patient has a recurrent seizure, or EEG shows indication for increased risk of recurrent seizure activity.  We will continue to follow this patient with you.  C.R. Nicole Kindred, MD [facility] [phone]  07/15/2013  10:02 AM

## 2013-07-15 NOTE — Progress Notes (Signed)
EEG Completed; Results Pending  

## 2013-07-15 NOTE — Progress Notes (Signed)
RASS -1. Poorly oriented. Calm. No evidence of alcohol withdrawal  Filed Vitals:   07/15/13 0500 07/15/13 0600 07/15/13 0700 07/15/13 0800  BP: 111/64 127/68 127/77 147/80  Pulse: 73 71 67 74  Temp:    99.4 F (37.4 C)  TempSrc:    Oral  Resp: 20 16 16 19   Height:      Weight:      SpO2: 96% 97% 99% 100%    NAD HEENT WNL No JVD Scattered wheezes RRR s M NABS, soft Ext warm without edema No focal neuro deficits   BMET    Component Value Date/Time   NA 114* 07/15/2013 0751   K 3.3* 07/15/2013 0751   CL 76* 07/15/2013 0751   CO2 24 07/15/2013 0751   GLUCOSE 85 07/15/2013 0751   BUN 6 07/15/2013 0751   CREATININE 0.38* 07/15/2013 0751   CALCIUM 8.5 07/15/2013 0751   GFRNONAA >90 07/15/2013 0751   GFRAA >90 07/15/2013 0751    CBC    Component Value Date/Time   WBC 9.3 07/15/2013 0420   RBC 3.94 07/15/2013 0420   HGB 13.1 07/15/2013 0420   HCT 34.6* 07/15/2013 0420   PLT 244 07/15/2013 0420   MCV 87.8 07/15/2013 0420   MCH 33.2 07/15/2013 0420   MCHC 37.9* 07/15/2013 0420   RDW 12.4 07/15/2013 0420   LYMPHSABS 0.7 07/14/2013 1803   MONOABS 0.7 07/14/2013 1803   EOSABS 0.0 07/14/2013 1803   BASOSABS 0.0 07/14/2013 1803    IMPRESSION: Principal Problem:   Hyponatremia syndrome - due to beer potomania    Na improving slowly Active Problems:   Seizure due to hyponatremia   Encephalopathy acute   Alcohol abuse    Hypertension   Hypokalemia  PLAN/REC: Cont to restrict free water Allow Na to correct slowly Monitor BMET intermittently Monitor I/Os Correct electrolytes as indicated Would avoid ACE-I in setting of hyponatremia Monitor for signs of EtOH withdrawal - none currently Cont PRN hydralazine to maintain SBP < 170 mmHg MRI seems low yield - discussed with Dr Nicole Kindred. Will DC Transfer to SDU TRH to assume care as of AM 6/25 and PCCM to sign off - discussed with Dr Thereasa Solo  Merton Border, MD ; [facility] service Mobile [phone].  After 5:30 PM or weekends, call  [phone]

## 2013-07-15 NOTE — Progress Notes (Signed)
eLink Physician-Brief Progress Note Patient Name: Joan [name] DOB: 1950-01-31 MRN: [id number]  Date of Service  07/15/2013   HPI/Events of Note   Na up to 112.  K down to 2.7.  eICU Interventions  Will d/c 3% NS, and use NS at 40 ml/hr >> f/u BMET.  Will give IV KCL.    Intervention Category Major Interventions: Other:  Rylen Hou 07/15/2013, 2:00 AM

## 2013-07-15 NOTE — Procedures (Signed)
ELECTROENCEPHALOGRAM REPORT  Patient: Joan [name]       Room #: 2C16 EEG No. ID: [id number] Age: 63 y.o.        Sex: female Referring Physician: Report Date:  07/15/2013        Interpreting Physician: Anthony Sar  History: Joan [name] is an 63 y.o. female history of hypertension, alcohol abuse and breast cancer brought to the emergency room for evaluation and management of acute encephalopathy as well as left focal motor twitches. Patient was found to be markedly hyponatremic.  Indications for study assess severity of encephalopathy; rule out focal seizure activity.:    Technique: This is an 18 channel routine scalp EEG performed at the bedside with bipolar and monopolar montages arranged in accordance to the international 10/20 system of electrode placement.   Description: This EEG recording was performed during wakefulness and during sleep.  Activity during wakefulness consisted of low amplitude 1-2 Hz diffuse irregular delta activity with superimposed 8-9 Hz rhythmic activity recorded from posterior head regions. During sleep no slowing of background activity with mixed delta and theta activity diffusely and symmetrically. Symmetrical vertex waves, sleep spindles and arousal responses were recorded during stage II of sleep. Photic stimulation and hyperventilation were not performed. No epileptiform discharges were recorded.  Interpretation:  This EEG is abnormal with mild generalized nonspecific slowing of cerebral activity, consistent with mild encephalopathic process. His been no slowing can be seen with metabolic as well as degenerative and toxic encephalopathies. No evidence of an epileptic disorder is demonstrated.  Rush Farmer M.D. [facility] [phone]

## 2013-07-16 ENCOUNTER — Inpatient Hospital Stay (HOSPITAL_COMMUNITY): Payer: BC Managed Care – PPO

## 2013-07-16 DIAGNOSIS — I10 Essential (primary) hypertension: Secondary | ICD-10-CM

## 2013-07-16 LAB — CBC
HCT: 34 % — ABNORMAL LOW (ref 36.0–46.0)
Hemoglobin: 12.4 g/dL (ref 12.0–15.0)
MCH: 32.5 pg (ref 26.0–34.0)
MCHC: 36.5 g/dL — ABNORMAL HIGH (ref 30.0–36.0)
MCV: 89 fL (ref 78.0–100.0)
Platelets: 223 10*3/uL (ref 150–400)
RBC: 3.82 MIL/uL — AB (ref 3.87–5.11)
RDW: 12.4 % (ref 11.5–15.5)
WBC: 6.5 10*3/uL (ref 4.0–10.5)

## 2013-07-16 LAB — BASIC METABOLIC PANEL
BUN: 4 mg/dL — ABNORMAL LOW (ref 6–23)
CALCIUM: 8.6 mg/dL (ref 8.4–10.5)
CO2: 24 mEq/L (ref 19–32)
Chloride: 86 mEq/L — ABNORMAL LOW (ref 96–112)
Creatinine, Ser: 0.38 mg/dL — ABNORMAL LOW (ref 0.50–1.10)
Glucose, Bld: 84 mg/dL (ref 70–99)
Potassium: 3.7 mEq/L (ref 3.7–5.3)
SODIUM: 123 meq/L — AB (ref 137–147)

## 2013-07-16 MED ORDER — LORAZEPAM 2 MG/ML IJ SOLN
1.0000 mg | Freq: Once | INTRAMUSCULAR | Status: AC
  Administered 2013-07-16: 1 mg via INTRAVENOUS
  Filled 2013-07-16: qty 1

## 2013-07-16 MED ORDER — LORAZEPAM 2 MG/ML IJ SOLN
0.5000 mg | Freq: Once | INTRAMUSCULAR | Status: AC
  Administered 2013-07-16: 0.5 mg via INTRAVENOUS
  Filled 2013-07-16: qty 1

## 2013-07-16 NOTE — Progress Notes (Signed)
Subjective: Patient had no new complaints. No overnight adverse events reported including no seizure activity.  Objective: Current vital signs: BP 114/55  Pulse 57  Temp(Src) 97.7 F (36.5 C) (Oral)  Resp 13  Ht 5\' 4"  (1.626 m)  Wt 55.4 kg (122 lb 2.2 oz)  BMI 20.95 kg/m2  SpO2 96%  Neurologic Exam: Alert and in no acute distress. Patient was slightly disoriented to her correct age and did not know the current month. She was oriented to place. No agitation noted. Speech was normal. No focal weakness noted.  Serum sodium this morning was 123.  Medications: I have reviewed the patient's current medications.  Assessment/Plan: Encephalopathic state secondary to severe hyponatremia, as well as likely withdrawal from chronic alcohol use. Patient's mental status is slowly improving.  Recommend no changes in current management. I will plan to see her in followup on an as-needed basis following this visit.  C.R. Nicole Kindred, MD [facility] [phone]  07/16/2013  9:13 AM

## 2013-07-16 NOTE — Progress Notes (Signed)
[place] TEAM 1 - Stepdown/ICU TEAM Progress Note  Joan [name] ZOX:[id number] DOB: [birth year] DOA: 07/14/2013 PCP: No primary provider on file.  Admit HPI / Brief Narrative: 63 yo WF PMHx of HTN and breast Ca (resection and chemo, last dose reportedly 4 years ago). She is a current smoker (one PPD x 20+ yrs) and reportedly drinks 6-12 beers daily. 6/21 began to have some alterations in mental status in that she was fatigued, and quiet. This continued to worsen. 6/23 began to have slurred speech and was brought to ED. In ED was alert and intermittently following commands, she was noted to have slight gaze deviation to left and slight R sided weakness by EDP. She was hemodynamically stable, and received 2L boluses of NS. She went to CT which was abnormal as above. MRI ordered in ED. Chemistry resulted with sodium 105. PCCM admitted to ICU.     HPI/Subjective: 6/25 A./O. x2 (does not know when, why), negative complaints.  Assessment/Plan: Hyponatremia syndrome secondary to Potomania -Patient's hyponatremia correcting, continue gentle hydration -Cognitive status improving  -Counseled patient extensively on sequela of continuing to consume large quantities of beer to include seizures, stroke, death. -Continue to monitor closely -Patient REFUSES TO speak with CSW about inpatient/outpatient alcohol rehabilitation program  Seizure -Most likely secondary to severe hyponatremia, abnormal EEG. Neurology recommends no further treatment.  Encephalopathy acute -Most likely secondary to patient's severe hyponatremia brought on by alcoholism. -Cognition beginning to improve. -Possible thrombosed vessel involving the left anterior circulation. MRI brain pending  Alcohol abuse -Counseled patient extensively on sequela of continuing to consume large quantities of beer to include seizures, stroke, death. -Patient REFUSES TO speak with CSW about inpatient/outpatient alcohol rehabilitation  program  HTN -Within AHA guidelines  Hypokalemia -Resolved -Monitor closely    Code Status: FULL Family Communication: no family present at time of exam Disposition Plan: Resolution hyponatremia    Consultants: Dr. Wallie Char (neurology) Dr. Merton Border (PCCM)    Procedure/Significant Events: 6/23 CT head without contrast; Linear hyperdensity not far from the left foramen of Monro. Could be a venous angioma or conceivably a thrombosed vessel involving the left anterior circulation.  -Periventricular white matter and corona radiata hypodensities; Favor chronic ischemic microvascular white matter disease. 6/24 EEG;abnormal with mild generalized nonspecific slowing of cerebral activity, consistent with mild encephalopathic process. Slowing can be seen with metabolic as well as degenerative and toxic encephalopathies. No evidence of an epileptic disorder is demonstrated    Culture NA  Antibiotics: NA  DVT prophylaxis: Prophylactic dose Lovenox   Devices    LINES / TUBES:  PIV     Continuous Infusions: . 0.9 % NaCl with KCl 40 mEq / L 50 mL/hr (07/16/13 0517)    Objective: VITAL SIGNS: Temp: 97.7 F (36.5 C) (06/25 0800) Temp src: Oral (06/25 0800) BP: 114/55 mmHg (06/25 0400) Pulse Rate: 57 (06/25 0400) SPO2; FIO2:   Intake/Output Summary (Last 24 hours) at 07/16/13 1147 Last data filed at 07/16/13 0900  Gross per 24 hour  Intake   1670 ml  Output   1550 ml  Net    120 ml     Exam: General: A./O. x2 (does not know when, why), NAD, No acute respiratory distress Lungs: Clear to auscultation bilaterally (coarse breath sounds), without wheezes or crackles Cardiovascular: Regular rate and rhythm without murmur gallop or rub normal S1 and S2 Abdomen: Nontender, nondistended, soft, bowel sounds positive, no rebound, no ascites, no appreciable mass Extremities: No significant cyanosis, clubbing, or  edema bilateral lower extremities  Data  Reviewed: Basic Metabolic Panel:  Recent Labs Lab 07/15/13 0401 07/15/13 0601 07/15/13 0751 07/15/13 1030 07/16/13 0513  NA 112* 115* 114* 115* 123*  K 2.9* 3.3* 3.3* 3.2* 3.7  CL 72* 76* 76* 78* 86*  CO2 25 25 24 24 24   GLUCOSE 85 90 85 105* 84  BUN 5* 6 6 5* 4*  CREATININE 0.40* 0.42* 0.38* 0.37* 0.38*  CALCIUM 8.6 8.6 8.5 8.4 8.6   Liver Function Tests:  Recent Labs Lab 07/14/13 1803  AST 45*  ALT 31  ALKPHOS 111  BILITOT 1.0  PROT 7.7  ALBUMIN 4.1   No results found for this basename: LIPASE, AMYLASE,  in the last 168 hours No results found for this basename: AMMONIA,  in the last 168 hours CBC:  Recent Labs Lab 07/14/13 1803 07/15/13 0420 07/16/13 0513  WBC 7.9 9.3 6.5  NEUTROABS 6.5  --   --   HGB 14.0 13.1 12.4  HCT 37.9 34.6* 34.0*  MCV 86.7 87.8 89.0  PLT 253 244 223   Cardiac Enzymes: No results found for this basename: CKTOTAL, CKMB, CKMBINDEX, TROPONINI,  in the last 168 hours BNP (last 3 results) No results found for this basename: PROBNP,  in the last 8760 hours CBG: No results found for this basename: GLUCAP,  in the last 168 hours  Recent Results (from the past 240 hour(s))  MRSA PCR SCREENING     Status: None   Collection Time    07/14/13 11:23 PM      Result Value Ref Range Status   MRSA by PCR NEGATIVE  NEGATIVE Final   Comment:            The GeneXpert MRSA Assay (FDA     approved for NASAL specimens     only), is one component of a     comprehensive MRSA colonization     surveillance program. It is not     intended to diagnose MRSA     infection nor to guide or     monitor treatment for     MRSA infections.     Studies:  Recent x-ray studies have been reviewed in detail by the Attending Physician  Scheduled Meds:  Scheduled Meds: . enoxaparin (LOVENOX) injection  40 mg Subcutaneous Q24H  . folic acid  1 mg Intravenous Daily  . pneumococcal 23 valent vaccine  0.5 mL Intramuscular Tomorrow-1000    Time spent on  care of this patient: 40 mins   Allie Bossier , MD   [facility] Office  [phone] Pager [phone]  On-Call/Text Page:      [email or web address]      password [email or web address]  If 7PM-7AM, please contact night-coverage [email or web address] Password [facility] 07/16/2013, 11:47 AM   LOS: 2 days

## 2013-07-17 ENCOUNTER — Encounter (HOSPITAL_COMMUNITY): Payer: Self-pay | Admitting: Radiology

## 2013-07-17 ENCOUNTER — Inpatient Hospital Stay (HOSPITAL_COMMUNITY): Payer: BC Managed Care – PPO

## 2013-07-17 MED ORDER — LORAZEPAM 1 MG PO TABS: 1.0000 mg | ORAL_TABLET | ORAL | Status: DC | PRN

## 2013-07-17 MED ORDER — IOHEXOL 350 MG/ML SOLN
50.0000 mL | Freq: Once | INTRAVENOUS | Status: AC | PRN
Start: 2013-07-17 — End: 2013-07-17
  Administered 2013-07-17: 50 mL via INTRAVENOUS

## 2013-07-17 MED ORDER — VITAMIN B-1 100 MG PO TABS
100.0000 mg | ORAL_TABLET | Freq: Every day | ORAL | Status: DC
  Administered 2013-07-17 – 2013-07-18 (×2): 100 mg via ORAL
  Filled 2013-07-17 (×2): qty 1

## 2013-07-17 MED ORDER — LISINOPRIL 20 MG PO TABS
20.0000 mg | ORAL_TABLET | Freq: Every day | ORAL | Status: DC
  Administered 2013-07-17 – 2013-07-18 (×2): 20 mg via ORAL
  Filled 2013-07-17 (×2): qty 1

## 2013-07-17 NOTE — Evaluation (Signed)
Physical Therapy Evaluation Patient Details Name: Joan [name] MRN: [id number] DOB: 1950/03/16 Today's Date: 07/17/2013   History of Present Illness  Patient is a 63 y/o female presents with PMH positive for HTN, breast ca and alcohol abuse admitted to ED with AMS and slurred speech. Abnormal EEG with likely seizure. Admitted with acute encephalopathy secondary to hyponatremia. MRI pending.   Clinical Impression  Patient presents with impaired balance and poor safety awareness putting pt at increased risk for falls. Difficult to determine pt's functional baseline secondary to pt being poor historian. Anticipate pt's balance will improve with continued therapy and improvement of medical conditions. Pt would benefit from skilled therapy in acute setting so pt can return to PLOF and maximize independence. May benefit from an assistive device at discharge pending progress and improvement.    Follow Up Recommendations No PT follow up (pending improvement in acute therapy setting. )    Equipment Recommendations   (to be assessed.)    Recommendations for Other Services       Precautions / Restrictions Precautions Precautions: Fall Restrictions Weight Bearing Restrictions: No      Mobility  Bed Mobility               General bed mobility comments: Sitting in chair upon arrival.  Transfers Overall transfer level: Needs assistance Equipment used: None Transfers: Sit to/from Stand Sit to Stand: Min guard            Ambulation/Gait Ambulation/Gait assistance: Min assist Ambulation Distance (Feet): 200 Feet (+150 with seated rest break between bouts.) Assistive device: None (utilized handrail in hallway on first ambulation bout and no support on second bout.) Gait Pattern/deviations: Scissoring;Staggering left;Staggering right;Narrow base of support   Gait velocity interpretation: Below normal speed for age/gender General Gait Details: Pt swaying in all directions during  ambulation, balance worsened with head movements. 2 LOB requiring Min A to prevent fall. Distraction resulted in impaired balance.  Stairs Stairs: Yes Stairs assistance: Min assist Stair Management: One rail Right;One rail Left;Alternating pattern Number of Stairs: 4 General stair comments: Ascended steps with use of handrail on R; Descended steps with handrail on L. After being instructed to slow down, pt running down steps during descent with almost LOB, "haha that was fun!" Poor safety awareness.  Wheelchair Mobility    Modified Rankin (Stroke Patients Only)       Balance Overall balance assessment: Needs assistance Sitting-balance support: Feet supported;No upper extremity supported Sitting balance-Leahy Scale: Normal Sitting balance - Comments: Able to withstand perturbations in all directions without LOB.   Standing balance support: No upper extremity supported;During functional activity Standing balance-Leahy Scale: Fair                               Pertinent Vitals/Pain 0/10.    Home Living Family/patient expects to be discharged to:: Private residence Living Arrangements: Other (Comment) (Lives with roommate who is handicapped, per pt report.) Available Help at Discharge: Other (Comment);Friend(s) (Roommate ) Type of Home: House Home Access: Stairs to enter Entrance Stairs-Rails: Right Entrance Stairs-Number of Steps: 4 Home Layout: One level Home Equipment: None      Prior Function Level of Independence: Independent               Hand Dominance        Extremity/Trunk Assessment   Upper Extremity Assessment: Overall WFL for tasks assessed  Lower Extremity Assessment: Overall WFL for tasks assessed         Communication   Communication: No difficulties  Cognition Arousal/Alertness: Awake/alert Behavior During Therapy: WFL for tasks assessed/performed Overall Cognitive Status: No family/caregiver present to  determine baseline cognitive functioning (However, pt oriented to place and vaguely to time  "2015" Not able to state month or reason for admission.)                      General Comments      Exercises        Assessment/Plan    PT Assessment Patient needs continued PT services  PT Diagnosis Difficulty walking   PT Problem List Decreased balance;Decreased safety awareness  PT Treatment Interventions Gait training;Stair training;Functional mobility training;Patient/family education;Balance training;DME instruction;Therapeutic activities;Cognitive remediation;Therapeutic exercise   PT Goals (Current goals can be found in the Care Plan section) Acute Rehab PT Goals Patient Stated Goal: to go home PT Goal Formulation: With patient Time For Goal Achievement: 07/24/13 Potential to Achieve Goals: Good    Frequency Min 3X/week   Barriers to discharge        Co-evaluation               End of Session Equipment Utilized During Treatment: Gait belt Activity Tolerance: Patient tolerated treatment well Patient left: in chair;with call bell/phone within reach Nurse Communication: Mobility status         Time: 4270-6237 PT Time Calculation (min): 23 min   Charges:   PT Evaluation $Initial PT Evaluation Tier I: 1 Procedure PT Treatments $Gait Training: 8-22 mins   PT G CodesCandy Sledge A 07/17/2013, 4:40 PM Candy Sledge, [place], DPT [phone]

## 2013-07-17 NOTE — Progress Notes (Signed)
Patient is transferred to 5W25.  Transferred via wheelchair.  Tried to call Pearlina but unable to get reached her.  Her cricket phone number is no longer in service as per recording.

## 2013-07-17 NOTE — Progress Notes (Signed)
[place] TEAM 1 - Stepdown/ICU TEAM Progress Note  Joan [name] ZOX:[id number] DOB: 1950/04/01 DOA: 07/14/2013 PCP: No primary provider on file.  Chart reviewed.  Admit HPI / Brief Narrative: 63 yo WF PMHx of HTN and breast Ca (resection and chemo, last dose reportedly 4 years ago). She is a current smoker (one PPD x 20+ yrs) and reportedly drinks 6-12 beers daily. 6/21 began to have some alterations in mental status in that she was fatigued, and quiet. This continued to worsen. 6/23 began to have slurred speech and was brought to ED. In ED was alert and intermittently following commands, she was noted to have slight gaze deviation to left and slight R sided weakness by EDP. She was hemodynamically stable, and received 2L boluses of NS. She went to CT which was abnormal as above. MRI ordered in ED. Chemistry resulted with sodium 105. PCCM admitted to ICU for hypertonic saline. Neuro consulted and signed off. Transferred to [facility] 6/25.  Assessment/Plan: Hyponatremia syndrome secondary to [place] S/p hypertonic saline -Patient's hyponatremia correcting, continue gentle hydration -Cognitive status back to baseline Transfer to medsurg. Place PIV then d/c central line Mobilize, PT eval Also, check TSH -declines SW consult for EtOH treatment  Seizure Likely from hyponatremia. None further. EEG just shows slowing  Encephalopathy acute -Most likely secondary to patient's severe hyponatremia/seizure/EtOH resolved  Alcohol abuse Counseled against. declines SW consult. Add thiamine, CIWA checks and PRN ativan, though no signs of withdrawal currently.  Benign hypertension Resume lisinopril  Hypokalemia -Resolved  abnormal CT head on admission:  No headache or focal defecits.  Neuro reportedly felt low yield, but Dr. Sherral Hammers ordered MRI brian 6/25. Pt unable to tolerate due to claustrophobia.  Will get CTA brain to complete workup  H/o Breast cancer  Code Status: FULL Family  Communication: no family present at time of exam Disposition Plan: transfer to medsurg. Likely home in a few days if continues to stabilize  Consultants: Dr. Wallie Char (neurology) Dr. Merton Border (PCCM)    Procedure/Significant Events: 6/23 CT head without contrast; Linear hyperdensity not far from the left foramen of Monro. Could be a venous angioma or conceivably a thrombosed vessel involving the left anterior circulation.  -Periventricular white matter and corona radiata hypodensities; Favor chronic ischemic microvascular white matter disease. 6/24 EEG;abnormal with mild generalized nonspecific slowing of cerebral activity, consistent with mild encephalopathic process. Slowing can be seen with metabolic as well as degenerative and toxic encephalopathies. No evidence of an epileptic disorder is demonstrated  Culture NA  Antibiotics: NA  DVT prophylaxis: Prophylactic dose Lovenox   Devices    LINES / TUBES:  Central line, left neck  Continuous Infusions: . 0.9 % NaCl with KCl 40 mEq / L 50 mL/hr (07/17/13 0228)   HPI/Subjective: Feels fine. Tired of broth.  Has not yet been oob.  Lives with a friend.  Objective: VITAL SIGNS: Temp: 97.6 F (36.4 C) (06/26 0815) Temp src: Oral (06/26 0815) BP: 150/93 mmHg (06/26 0815) Pulse Rate: 82 (06/26 0815) SPO2; FIO2:   Intake/Output Summary (Last 24 hours) at 07/17/13 0845 Last data filed at 07/17/13 0600  Gross per 24 hour  Intake   2580 ml  Output   1650 ml  Net    930 ml     Exam: General: A./O. X3. apropriate and cooperative Lungs: Clear to auscultation bilaterally without WRR Cardiovascular: Regular rate and rhythm without murmur gallop or rub normal S1 and S2 Abdomen: Nontender, nondistended, soft, bowel sounds positive, no rebound,  no ascites, no appreciable mass Extremities: No significant cyanosis, clubbing, or edema bilateral lower extremities Neuro: nonfocal. No tremor or asterixis  Data  Reviewed: Basic Metabolic Panel:  Recent Labs Lab 07/15/13 0401 07/15/13 0601 07/15/13 0751 07/15/13 1030 07/16/13 0513  NA 112* 115* 114* 115* 123*  K 2.9* 3.3* 3.3* 3.2* 3.7  CL 72* 76* 76* 78* 86*  CO2 25 25 24 24 24   GLUCOSE 85 90 85 105* 84  BUN 5* 6 6 5* 4*  CREATININE 0.40* 0.42* 0.38* 0.37* 0.38*  CALCIUM 8.6 8.6 8.5 8.4 8.6   Liver Function Tests:  Recent Labs Lab 07/14/13 1803  AST 45*  ALT 31  ALKPHOS 111  BILITOT 1.0  PROT 7.7  ALBUMIN 4.1   No results found for this basename: LIPASE, AMYLASE,  in the last 168 hours No results found for this basename: AMMONIA,  in the last 168 hours CBC:  Recent Labs Lab 07/14/13 1803 07/15/13 0420 07/16/13 0513  WBC 7.9 9.3 6.5  NEUTROABS 6.5  --   --   HGB 14.0 13.1 12.4  HCT 37.9 34.6* 34.0*  MCV 86.7 87.8 89.0  PLT 253 244 223   Cardiac Enzymes: No results found for this basename: CKTOTAL, CKMB, CKMBINDEX, TROPONINI,  in the last 168 hours BNP (last 3 results) No results found for this basename: PROBNP,  in the last 8760 hours CBG: No results found for this basename: GLUCAP,  in the last 168 hours  Recent Results (from the past 240 hour(s))  MRSA PCR SCREENING     Status: None   Collection Time    07/14/13 11:23 PM      Result Value Ref Range Status   MRSA by PCR NEGATIVE  NEGATIVE Final   Comment:            The GeneXpert MRSA Assay (FDA     approved for NASAL specimens     only), is one component of a     comprehensive MRSA colonization     surveillance program. It is not     intended to diagnose MRSA     infection nor to guide or     monitor treatment for     MRSA infections.    Scheduled Meds:  Scheduled Meds: . enoxaparin (LOVENOX) injection  40 mg Subcutaneous Q24H  . folic acid  1 mg Intravenous Daily  . pneumococcal 23 valent vaccine  0.5 mL Intramuscular Tomorrow-1000    Time spent on care of this patient: 40 mins   Delfina Redwood , MD   [facility] Pager -  [phone]  If 7PM-7AM, please contact night-coverage [email or web address] Password [email or web address] 07/17/2013, 8:45 AM   LOS: 3 days

## 2013-07-17 NOTE — Progress Notes (Signed)
Pt transferred to the unit at 1234. Pt mental status is alert and oriented with periods of confusion. Pt oriented to room, staff, and call bell. Skin is intact. Full assessment charted in [facility]. Call bell within reach. Visitor guidelines reviewed w/ pt and/or family.

## 2013-07-18 LAB — BASIC METABOLIC PANEL
BUN: 5 mg/dL — ABNORMAL LOW (ref 6–23)
CO2: 22 meq/L (ref 19–32)
Calcium: 9.2 mg/dL (ref 8.4–10.5)
Chloride: 97 mEq/L (ref 96–112)
Creatinine, Ser: 0.52 mg/dL (ref 0.50–1.10)
GFR calc non Af Amer: >90 mL/min (ref >90)
Glucose, Bld: 85 mg/dL (ref 70–99)
POTASSIUM: 4.3 meq/L (ref 3.7–5.3)
Sodium: 132 mEq/L — ABNORMAL LOW (ref 137–147)

## 2013-07-18 LAB — TSH: TSH: 1.02 u[IU]/mL (ref 0.350–4.500)

## 2013-07-18 MED ORDER — FOLIC ACID 1 MG PO TABS: 1.0000 mg | ORAL_TABLET | Freq: Every day | ORAL | Status: DC

## 2013-07-18 NOTE — Progress Notes (Signed)
D/C orders received. Pt educated on d/c instructions and given d/c packet. Pt provided information on alcohol and smoking cessation. Verbalized understanding. IV removed. Pt taken downstairs by staff via wheelchair.

## 2013-07-18 NOTE — Discharge Summary (Signed)
Physician Discharge Summary  Joan [name] NWG:[id number] DOB: 06/19/1950 DOA: 07/14/2013  PCP: Tracie Harrier, MD  Admit date: 07/14/2013 Discharge date: 07/18/2013  Time spent: greater than 30 minutes  Recommendations for Outpatient Follow-up:  1. Monitor sodium  Discharge Diagnoses:  Principal Problem:   Hyponatremia syndrome Active Problems:   Seizure   Encephalopathy acute   Hypertension   Alcohol abuse   Discharge Condition: stable  Filed Weights   07/16/13 0500 07/17/13 0245 07/18/13 0536  Weight: 55.4 kg (122 lb 2.2 oz) 57 kg (125 lb 10.6 oz) 54.6 kg (120 lb 5.9 oz)    History of present illness:  63 year old female with PMH of hypertension, breast Ca, and alcohol abuse presents to [facility] ED 6/23 with 3 day history of progressive AMS. Reportedly had blank stare, aphasic and shaking of right hand. Came to ED as code stroke.  In ED found to have Na 105. Admitted to PCCM, ICU, central line placed for hypertonic saline.    Hospital Course:  Hyponatremia corrected slowly.  Mental status cleared. No further seizures.  Likely had seizure at home related to alcohol and hyponatremia.  No AEDs recommended by neurology, as this was likely provoked by same.  Initial CT showed linear hyperdensity not far from the left foramen of Monro. CT angiogram of the head showed no thrombus, just vessel tortuosity.  EEG showed no epileptiform activity. By discharge, sodium is 132, patient has stable vital signs, ambulating and lucid  Patient had no evidence of alcohol withdrawal. counseled to quit drinking. She declined social work consultation, but voices the desire to stop drinking.   Procedures:  Left IJ central line  Consultations:  Neurology  pccm  Discharge Exam: Filed Vitals:   07/18/13 0536  BP: 133/66  Pulse: 63  Temp: 97.8 F (36.6 C)  Resp: 16    General: alert, oriented Cardiovascular: RRR Respiratory: CTA Neuro: nonfocal  Discharge Instructions   Diet general     Complete by:  As directed      Increase activity slowly    Complete by:  As directed             Medication List         lisinopril 20 MG tablet  Commonly known as:  PRINIVIL,ZESTRIL  Take 20 mg by mouth daily.       No Known Allergies     Follow-up Information   Follow up with Tracie Harrier, MD In 1 week. (to check sodium)    Specialty:  Internal Medicine   Contact information:   [facility]- Internal Medicine [place] [address] [phone]        The results of significant diagnostics from this hospitalization (including imaging, microbiology, ancillary and laboratory) are listed below for reference.    Significant Diagnostic Studies: Ct Angio Head W/cm &/or Wo Cm  07/17/2013   CLINICAL DATA:  63 year old female with vomiting and slurred speech. Asymmetric left ACA region hyperdensity on earlier non contrast head CT, query intracranial vascular thrombosis. Initial encounter.  EXAM: CT ANGIOGRAPHY HEAD  TECHNIQUE: Multidetector CT imaging of the head was performed using the standard protocol during bolus administration of intravenous contrast. Multiplanar CT image reconstructions and MIPs were obtained to evaluate the vascular anatomy.  CONTRAST:  74mL OMNIPAQUE IOHEXOL 350 MG/ML SOLN  COMPARISON:  Head CT without contrast 07/14/2013. Report of the CT head without and with contrast [facility] 11/26/2002 (no images available).  FINDINGS: Visualized paranasal sinuses and mastoids are  clear. No acute osseous abnormality identified. Visualized orbits and scalp soft tissues are within normal limits.  Stable and normal for age cerebral volume. No ventriculomegaly. No midline shift, mass effect, or evidence of intracranial mass lesion. No acute intracranial hemorrhage identified. No evidence of cortically based acute infarction identified. Stable gray-white matter differentiation throughout the brain.  On post-contrast images there  is a subtle 5 mm focus of mild enhancement in the medial left posterior frontal lobe near the central sulcus (series 11, image 24). This does NOT correspond to the recent anterior left CT finding. No surrounding edema or mass effect. A similar finding was described in 2004. No other abnormal enhancement identified.  VASCULAR FINDINGS:  Major intracranial venous structures are enhancing.  Fairly codominant distal vertebral arteries are patent. Normal PICA origins. Normal vertebrobasilar junction (incidental fenestration). Normal AICA origins. No basilar stenosis. Normal SCA and PCA origins. Posterior communicating arteries are diminutive or absent. Bilateral PCA branches are within normal limits.  Negative distal cervical ICAs. Patent ICA siphons with calcified plaque, involving the cavernous and supra clinoid segments. No hemodynamically significant stenosis identified. Ophthalmic artery origins are within normal limits. Normal carotid termini. Normal MCA and ACA origins.  Mildly dominant left ACA. Normal anterior communicating artery. Tortuosity of the A2 segments in the region described is hyperdense on the recent CT (series 8, image 87). Otherwise normal bilateral ACA branches.  Bilateral MCA branches are within normal limits.  Review of the MIP images confirms the above findings.  IMPRESSION: 1. Mild ACA vessel tortuosity corresponds to the recent noncontrast CT finding. 2. Otherwise negative intracranial CTA except for a ICA siphon calcified atherosclerosis. 3. Small 5 mm focus of mild enhancement in the left posterior frontal lobe (series 11, image 24) is evident following IV contrast. No associated edema or mass effect. 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 4. Otherwise stable CT appearance of the brain since 07/14/2013.   Electronically Signed   By: Lars Pinks M.D.   On: 07/17/2013 18:31   Ct Head Wo Contrast  07/14/2013    CLINICAL DATA:  Left 4 days.  Vomiting.  Slurred speech.  EXAM: CT HEAD WITHOUT CONTRAST  TECHNIQUE: Contiguous axial images were obtained from the base of the skull through the vertex without intravenous contrast.  COMPARISON:  Report from 11/26/2002  FINDINGS: Small hyperdense structure medially in the left frontal lobe near the foramen of Monro as on image 11 of series 4.  The brainstem, cerebellum, cerebral peduncle is, thalami, basal ganglia, ventricular system, and basilar cisterns appear otherwise unremarkable.  Periventricular white matter and corona radiata hypodensities favor chronic ischemic microvascular white matter disease. No specific parenchymal signs acute hemorrhage, CVA, or mass.  IMPRESSION: 1. Bili finding of note is a linear hyperdensity not far from the left foramen of Monro. This could be a venous angioma or conceivably a thrombosed vessel involving the left anterior circulation. I doubt this is local blood products. Consider MRI or CT angiography for further characterization. 2. Periventricular white matter and corona radiata hypodensities favor chronic ischemic microvascular white matter disease.  These results were called by telephone at the time of interpretation on 07/14/2013 at 6:41 PM to Dr. Armida Sans, who verbally acknowledged these results.   Electronically Signed   By: Sherryl Barters M.D.   On: 07/14/2013 18:44   Dg Chest Port 1 View  07/15/2013   CLINICAL DATA:  Central venous line insertion.  EXAM: PORTABLE  CHEST - 1 VIEW  COMPARISON:  07/14/2013  FINDINGS: Interval placement of a left central venous catheter with tip over the mid SVC region. No pneumothorax. Heart size and pulmonary vascularity are normal. No focal airspace disease or consolidations found in the lungs.  IMPRESSION: Left central venous catheter tip overlies the mid SVC region. No pneumothorax.   Electronically Signed   By: Lucienne Capers M.D.   On: 07/15/2013 00:26   Dg Chest Portable 1 View  07/14/2013    CLINICAL DATA:  Code stroke  EXAM: PORTABLE CHEST - 1 VIEW  COMPARISON:  None.  FINDINGS: The heart size and mediastinal contours are within normal limits. Both lungs are clear. The visualized skeletal structures are unremarkable.  IMPRESSION: No active disease.   Electronically Signed   By: Lucienne Capers M.D.   On: 07/14/2013 21:14   EEG generalized slowing  Microbiology: Recent Results (from the past 240 hour(s))  MRSA PCR SCREENING     Status: None   Collection Time    07/14/13 11:23 PM      Result Value Ref Range Status   MRSA by PCR NEGATIVE  NEGATIVE Final   Comment:            The GeneXpert MRSA Assay (FDA     approved for NASAL specimens     only), is one component of a     comprehensive MRSA colonization     surveillance program. It is not     intended to diagnose MRSA     infection nor to guide or     monitor treatment for     MRSA infections.     Labs: Basic Metabolic Panel:  Recent Labs Lab 07/15/13 0601 07/15/13 0751 07/15/13 1030 07/16/13 0513 07/18/13 0447  NA 115* 114* 115* 123* 132*  K 3.3* 3.3* 3.2* 3.7 4.3  CL 76* 76* 78* 86* 97  CO2 25 24 24 24 22   GLUCOSE 90 85 105* 84 85  BUN 6 6 5* 4* 5*  CREATININE 0.42* 0.38* 0.37* 0.38* 0.52  CALCIUM 8.6 8.5 8.4 8.6 9.2   Liver Function Tests:  Recent Labs Lab 07/14/13 1803  AST 45*  ALT 31  ALKPHOS 111  BILITOT 1.0  PROT 7.7  ALBUMIN 4.1   No results found for this basename: LIPASE, AMYLASE,  in the last 168 hours No results found for this basename: AMMONIA,  in the last 168 hours CBC:  Recent Labs Lab 07/14/13 1803 07/15/13 0420 07/16/13 0513  WBC 7.9 9.3 6.5  NEUTROABS 6.5  --   --   HGB 14.0 13.1 12.4  HCT 37.9 34.6* 34.0*  MCV 86.7 87.8 89.0  PLT 253 244 223   Cardiac Enzymes: No results found for this basename: CKTOTAL, CKMB, CKMBINDEX, TROPONINI,  in the last 168 hours BNP: BNP (last 3 results) No results found for this basename: PROBNP,  in the last 8760 hours CBG: No  results found for this basename: GLUCAP,  in the last 168 hours     Signed:  SULLIVAN,CORINNA L  [facility] 07/18/2013, 8:22 AM

## 2015-01-26 ENCOUNTER — Other Ambulatory Visit: Payer: Self-pay | Admitting: Internal Medicine

## 2015-01-26 DIAGNOSIS — Z1231 Encounter for screening mammogram for malignant neoplasm of breast: Secondary | ICD-10-CM

## 2015-02-02 ENCOUNTER — Ambulatory Visit: Payer: Self-pay

## 2015-02-11 ENCOUNTER — Ambulatory Visit
Admission: RE | Admit: 2015-02-11 | Discharge: 2015-02-11 | Disposition: A | Discharge status: Home / Self Care | Payer: BLUE CROSS/BLUE SHIELD | Source: Ambulatory Visit | Attending: Internal Medicine | Admitting: Internal Medicine

## 2015-02-11 DIAGNOSIS — Z1231 Encounter for screening mammogram for malignant neoplasm of breast: Secondary | ICD-10-CM | POA: Insufficient documentation

## 2015-08-31 DIAGNOSIS — Z720 Tobacco use: Secondary | ICD-10-CM | POA: Insufficient documentation

## 2015-08-31 DIAGNOSIS — Z889 Allergy status to unspecified drugs, medicaments and biological substances status: Secondary | ICD-10-CM | POA: Insufficient documentation

## 2015-08-31 DIAGNOSIS — I10 Essential (primary) hypertension: Secondary | ICD-10-CM | POA: Insufficient documentation

## 2015-08-31 DIAGNOSIS — F419 Anxiety disorder, unspecified: Secondary | ICD-10-CM | POA: Insufficient documentation

## 2015-08-31 DIAGNOSIS — F32A Depression, unspecified: Secondary | ICD-10-CM | POA: Insufficient documentation

## 2017-03-26 ENCOUNTER — Other Ambulatory Visit: Payer: Self-pay | Admitting: Internal Medicine

## 2017-03-26 DIAGNOSIS — Z1231 Encounter for screening mammogram for malignant neoplasm of breast: Secondary | ICD-10-CM

## 2017-04-18 ENCOUNTER — Ambulatory Visit
Admission: RE | Admit: 2017-04-18 | Discharge: 2017-04-18 | Disposition: A | Discharge status: Home / Self Care | Payer: BLUE CROSS/BLUE SHIELD | Source: Ambulatory Visit | Attending: Internal Medicine | Admitting: Internal Medicine

## 2017-04-18 DIAGNOSIS — Z1231 Encounter for screening mammogram for malignant neoplasm of breast: Secondary | ICD-10-CM | POA: Diagnosis not present

## 2017-04-18 HISTORY — DX: Personal history of irradiation: Z92.3

## 2017-04-22 ENCOUNTER — Other Ambulatory Visit: Payer: Self-pay | Admitting: Internal Medicine

## 2017-04-22 DIAGNOSIS — R928 Other abnormal and inconclusive findings on diagnostic imaging of breast: Secondary | ICD-10-CM

## 2017-04-22 DIAGNOSIS — N6489 Other specified disorders of breast: Secondary | ICD-10-CM

## 2017-04-30 ENCOUNTER — Ambulatory Visit
Admission: RE | Admit: 2017-04-30 | Discharge: 2017-04-30 | Disposition: A | Discharge status: Home / Self Care | Payer: BLUE CROSS/BLUE SHIELD | Source: Ambulatory Visit | Attending: Internal Medicine | Admitting: Internal Medicine

## 2017-04-30 DIAGNOSIS — N6321 Unspecified lump in the left breast, upper outer quadrant: Secondary | ICD-10-CM | POA: Insufficient documentation

## 2017-04-30 DIAGNOSIS — N6489 Other specified disorders of breast: Secondary | ICD-10-CM

## 2017-04-30 DIAGNOSIS — R928 Other abnormal and inconclusive findings on diagnostic imaging of breast: Secondary | ICD-10-CM | POA: Diagnosis present

## 2017-04-30 DIAGNOSIS — N6323 Unspecified lump in the left breast, lower outer quadrant: Secondary | ICD-10-CM | POA: Diagnosis not present

## 2017-09-02 ENCOUNTER — Other Ambulatory Visit: Payer: Self-pay | Admitting: Internal Medicine

## 2017-09-02 DIAGNOSIS — N6320 Unspecified lump in the left breast, unspecified quadrant: Secondary | ICD-10-CM

## 2017-09-19 DIAGNOSIS — Z0000 Encounter for general adult medical examination without abnormal findings: Secondary | ICD-10-CM | POA: Diagnosis not present

## 2017-09-19 DIAGNOSIS — I10 Essential (primary) hypertension: Secondary | ICD-10-CM | POA: Diagnosis not present

## 2017-09-19 DIAGNOSIS — Z720 Tobacco use: Secondary | ICD-10-CM | POA: Diagnosis not present

## 2017-09-19 DIAGNOSIS — Z889 Allergy status to unspecified drugs, medicaments and biological substances status: Secondary | ICD-10-CM | POA: Diagnosis not present

## 2017-10-11 DIAGNOSIS — F329 Major depressive disorder, single episode, unspecified: Secondary | ICD-10-CM | POA: Diagnosis not present

## 2017-10-11 DIAGNOSIS — Z720 Tobacco use: Secondary | ICD-10-CM | POA: Diagnosis not present

## 2017-10-11 DIAGNOSIS — Z853 Personal history of malignant neoplasm of breast: Secondary | ICD-10-CM | POA: Diagnosis not present

## 2017-10-11 DIAGNOSIS — I10 Essential (primary) hypertension: Secondary | ICD-10-CM | POA: Diagnosis not present

## 2017-10-11 DIAGNOSIS — M79605 Pain in left leg: Secondary | ICD-10-CM | POA: Diagnosis not present

## 2017-10-11 DIAGNOSIS — M79604 Pain in right leg: Secondary | ICD-10-CM | POA: Diagnosis not present

## 2017-10-25 ENCOUNTER — Encounter (INDEPENDENT_AMBULATORY_CARE_PROVIDER_SITE_OTHER): Payer: Self-pay | Admitting: Vascular Surgery

## 2017-10-25 ENCOUNTER — Ambulatory Visit (INDEPENDENT_AMBULATORY_CARE_PROVIDER_SITE_OTHER): Payer: Medicare HMO | Admitting: Vascular Surgery

## 2017-10-25 ENCOUNTER — Encounter (INDEPENDENT_AMBULATORY_CARE_PROVIDER_SITE_OTHER): Payer: Self-pay

## 2017-10-25 VITALS — BP 147/75 | HR 74 | Resp 17 | Ht 64.0 in | Wt 154.0 lb

## 2017-10-25 DIAGNOSIS — I10 Essential (primary) hypertension: Secondary | ICD-10-CM

## 2017-10-25 DIAGNOSIS — F17210 Nicotine dependence, cigarettes, uncomplicated: Secondary | ICD-10-CM

## 2017-10-25 DIAGNOSIS — I70219 Atherosclerosis of native arteries of extremities with intermittent claudication, unspecified extremity: Secondary | ICD-10-CM | POA: Insufficient documentation

## 2017-10-25 DIAGNOSIS — I70213 Atherosclerosis of native arteries of extremities with intermittent claudication, bilateral legs: Secondary | ICD-10-CM | POA: Diagnosis not present

## 2017-10-25 DIAGNOSIS — F17200 Nicotine dependence, unspecified, uncomplicated: Secondary | ICD-10-CM | POA: Diagnosis not present

## 2017-10-25 NOTE — Patient Instructions (Signed)

## 2017-10-25 NOTE — Assessment & Plan Note (Addendum)
Primary care physician began a work-up and ordered ABIs which showed markedly reduced perfusion bilaterally.  Her right ABI was 0.52 and her left ABI was 0.49. We have had a long discussion about the natural history and pathophysiology of peripheral arterial disease.  Tobacco use is likely her #1 risk factor and smoking cessation was stressed extensively today.  Recommend:  The patient has experienced increased symptoms and is now describing lifestyle limiting claudication bilaterally and mild rest pain on the left.   Given the severity of the patient's lower extremity symptoms the patient should undergo angiography and intervention.  Risk and benefits were reviewed the patient.  Indications for the procedure were reviewed.  All questions were answered, the patient agrees to proceed.   The patient should continue walking and begin a more formal exercise program.  The patient should continue antiplatelet therapy and aggressive treatment of the lipid abnormalities  The patient will follow up with me after the angiogram.

## 2017-10-25 NOTE — Progress Notes (Signed)
Patient ID: Joan [name], female   DOB: 1950/05/16, 67 y.o.   MRN: [id number]  Chief Complaint  Patient presents with  . New Patient (Initial Visit)    ABN ABI    HPI Joan [name] is a 67 y.o. female.  I am asked to see the patient by Dr. Ginette Pitman for evaluation of abnormal ABI.  The patient reports at least 6 months of pain in her legs with activity.  The pain involves the posterior calf area and sometimes thighs.  Both legs are affected.  She can only walk about 50 feet before having to stop and rest.  Over the past several weeks, she has noticed her left foot has been hurting at rest and she has been dangling it more.  She describes no open ulcerations or infection currently, but has had some sort of bunion that has not healed well that has been seen by Dr. Caryl Comes.  The symptoms were gradual in their onset and increasing in their severity.  This is now become debilitating and lifestyle limiting for her.  No clear inciting event or causative factor that started the symptoms.  Nothing is really making this better. Primary care physician began a work-up and ordered ABIs which showed markedly reduced perfusion bilaterally.  Her right ABI was 0.52 and her left ABI was 0.49.   Past Medical History:  Diagnosis Date  . Breast cancer, right breast [facility])    with radiation therapy  . Cancer ([place])   . Hepatitis C antibody test positive   . Hypertension   . Personal history of radiation therapy 2002   RIGHT lumpectomy    Past Surgical History:  Procedure Laterality Date  . APPENDECTOMY    . BREAST EXCISIONAL BIOPSY Right    11/1999  . BREAST LUMPECTOMY Right 2002   RIGHT lumpectomy w/ radiation 2002  . BREAST SURGERY    . CHOLECYSTECTOMY      Family History  Problem Relation Age of Onset  . Breast cancer Mother 20  . Breast cancer Maternal Aunt   . Breast cancer Maternal Grandmother   . Breast cancer Sister 63    Social History Social History   Tobacco Use  . Smoking status:  Current Every Day Smoker  . Smokeless tobacco: Never Used  Substance Use Topics  . Alcohol use: Not Currently    Comment: Beer heavy everyday  . Drug use: No  Still working  No Known Allergies  Current Outpatient Medications  Medication Sig Dispense Refill  . ALPRAZolam (XANAX) 0.5 MG tablet   0  . aspirin EC 81 MG tablet Take by mouth.    Marland Kitchen lisinopril (PRINIVIL,ZESTRIL) 20 MG tablet Take 20 mg by mouth daily.    Marland Kitchen loratadine (CLARITIN) 10 MG tablet Take by mouth.    . sertraline (ZOLOFT) 100 MG tablet Take by mouth.     No current facility-administered medications for this visit.       REVIEW OF SYSTEMS (Negative unless checked)  Constitutional: [] Weight loss  [] Fever  [] Chills Cardiac: [] Chest pain   [] Chest pressure   [] Palpitations   [] Shortness of breath when laying flat   [] Shortness of breath at rest   [] Shortness of breath with exertion. Vascular:  [] Pain in legs with walking   [] Pain in legs at rest   [] Pain in legs when laying flat   [x] Claudication   [] Pain in feet when walking  [x] Pain in feet at rest  [] Pain in feet when laying flat   []   History of DVT   [] Phlebitis   [] Swelling in legs   [] Varicose veins   [] Non-healing ulcers Pulmonary:   [] Uses home oxygen   [] Productive cough   [] Hemoptysis   [] Wheeze  [] COPD   [] Asthma Neurologic:  [] Dizziness  [] Blackouts   [] Seizures   [] History of stroke   [] History of TIA  [] Aphasia   [] Temporary blindness   [] Dysphagia   [] Weakness or numbness in arms   [] Weakness or numbness in legs Musculoskeletal:  [x] Arthritis   [] Joint swelling   [] Joint pain   [] Low back pain Hematologic:  [] Easy bruising  [] Easy bleeding   [] Hypercoagulable state   [] Anemic  [] Hepatitis Gastrointestinal:  [] Blood in stool   [] Vomiting blood  [] Gastroesophageal reflux/heartburn   [] Abdominal pain Genitourinary:  [] Chronic kidney disease   [] Difficult urination  [] Frequent urination  [] Burning with urination   [] Hematuria Skin:  [] Rashes   [] Ulcers    [] Wounds Psychological:  [] History of anxiety   []  History of major depression.    Physical Exam BP (!) 147/75 (BP Location: Right Arm, Patient Position: Sitting)   Pulse 74   Resp 17   Ht 5\' 4"  (1.626 m)   Wt 154 lb (69.9 kg)   BMI 26.43 kg/m  Gen:  WD/WN, NAD Head: [place]/AT, No temporalis wasting.  Ear/Nose/Throat: Hearing grossly intact, nares w/o erythema or drainage, oropharynx w/o Erythema/Exudate Eyes: Conjunctiva clear, sclera non-icteric  Neck: trachea midline.  No bruit or JVD.  Pulmonary:  Good air movement, respirations not labored, no use of accessory muscles Cardiac: RRR, no JVD Vascular:  Vessel Right Left  Radial Palpable Palpable                          PT 1+ Palpable Trace Palpable  DP Not Palpable Not Palpable    Musculoskeletal: M/S 5/5 throughout.  Extremities without ischemic changes.  No deformity or atrophy. Trace LE edema. Neurologic: Sensation grossly intact in extremities.  Symmetrical.  Speech is fluent. Motor exam as listed above. Psychiatric: Judgment intact, Mood & affect appropriate for pt's clinical situation. Dermatologic: No rashes or ulcers noted.  No cellulitis or open wounds. Lymph : No Cervical, Axillary, or Inguinal lymphadenopathy.   Radiology No results found.  Labs No results found for this or any previous visit (from the past 2160 hour(s)).  Assessment/Plan:  Hypertension blood pressure control important in reducing the progression of atherosclerotic disease. On appropriate oral medications.   Tobacco use disorder We had a discussion for approximately 4-5 minutes regarding the absolute need for smoking cessation due to the deleterious nature of tobacco on the vascular system. We discussed the tobacco use would diminish patency of any intervention, and likely significantly worsen progressio of disease. We discussed multiple agents for quitting including replacement therapy or medications to reduce cravings such as Chantix.  The patient voices their understanding of the importance of smoking cessation.   Atherosclerosis of native arteries of extremity with intermittent claudication [facility]) Primary care physician began a work-up and ordered ABIs which showed markedly reduced perfusion bilaterally.  Her right ABI was 0.52 and her left ABI was 0.49.  Recommend:  The patient has experienced increased symptoms and is now describing lifestyle limiting claudication bilaterally and mild rest pain on the left.   Given the severity of the patient's lower extremity symptoms the patient should undergo angiography and intervention.  Risk and benefits were reviewed the patient.  Indications for the procedure were reviewed.  All questions were answered,  the patient agrees to proceed.   The patient should continue walking and begin a more formal exercise program.  The patient should continue antiplatelet therapy and aggressive treatment of the lipid abnormalities  The patient will follow up with me after the angiogram.       [name] 10/25/2017, 11:10 AM   This note was created with Dragon medical transcription system.  Any errors from dictation are unintentional.

## 2017-10-25 NOTE — Assessment & Plan Note (Signed)
We had a discussion for approximately 4-5 minutes regarding the absolute need for smoking cessation due to the deleterious nature of tobacco on the vascular system. We discussed the tobacco use would diminish patency of any intervention, and likely significantly worsen progressio of disease. We discussed multiple agents for quitting including replacement therapy or medications to reduce cravings such as Chantix. The patient voices their understanding of the importance of smoking cessation.  

## 2017-10-25 NOTE — Assessment & Plan Note (Signed)
blood pressure control important in reducing the progression of atherosclerotic disease. On appropriate oral medications.  

## 2017-10-31 ENCOUNTER — Ambulatory Visit
Admission: RE | Admit: 2017-10-31 | Discharge: 2017-10-31 | Disposition: A | Discharge status: Home / Self Care | Payer: Medicare HMO | Source: Ambulatory Visit | Attending: Internal Medicine | Admitting: Internal Medicine

## 2017-10-31 DIAGNOSIS — N6320 Unspecified lump in the left breast, unspecified quadrant: Secondary | ICD-10-CM

## 2017-10-31 DIAGNOSIS — N6321 Unspecified lump in the left breast, upper outer quadrant: Secondary | ICD-10-CM | POA: Diagnosis not present

## 2017-10-31 DIAGNOSIS — R928 Other abnormal and inconclusive findings on diagnostic imaging of breast: Secondary | ICD-10-CM | POA: Diagnosis not present

## 2017-10-31 DIAGNOSIS — N6323 Unspecified lump in the left breast, lower outer quadrant: Secondary | ICD-10-CM | POA: Diagnosis not present

## 2017-11-05 ENCOUNTER — Other Ambulatory Visit (INDEPENDENT_AMBULATORY_CARE_PROVIDER_SITE_OTHER): Payer: Self-pay | Admitting: Vascular Surgery

## 2017-11-08 ENCOUNTER — Other Ambulatory Visit: Payer: Self-pay | Admitting: Internal Medicine

## 2017-11-08 DIAGNOSIS — N6320 Unspecified lump in the left breast, unspecified quadrant: Secondary | ICD-10-CM

## 2017-11-12 ENCOUNTER — Encounter
Admission: RE | Admit: 2017-11-12 | Discharge: 2017-11-12 | Disposition: A | Discharge status: Home / Self Care | Payer: Medicare HMO | Source: Ambulatory Visit | Attending: Vascular Surgery | Admitting: Vascular Surgery

## 2017-11-12 DIAGNOSIS — Z9889 Other specified postprocedural states: Secondary | ICD-10-CM | POA: Diagnosis not present

## 2017-11-12 DIAGNOSIS — Z923 Personal history of irradiation: Secondary | ICD-10-CM | POA: Diagnosis not present

## 2017-11-12 DIAGNOSIS — Z803 Family history of malignant neoplasm of breast: Secondary | ICD-10-CM | POA: Diagnosis not present

## 2017-11-12 DIAGNOSIS — F17200 Nicotine dependence, unspecified, uncomplicated: Secondary | ICD-10-CM | POA: Diagnosis not present

## 2017-11-12 DIAGNOSIS — Z9049 Acquired absence of other specified parts of digestive tract: Secondary | ICD-10-CM | POA: Diagnosis not present

## 2017-11-12 DIAGNOSIS — I10 Essential (primary) hypertension: Secondary | ICD-10-CM | POA: Diagnosis not present

## 2017-11-12 DIAGNOSIS — I70213 Atherosclerosis of native arteries of extremities with intermittent claudication, bilateral legs: Secondary | ICD-10-CM | POA: Diagnosis not present

## 2017-11-12 DIAGNOSIS — Z79899 Other long term (current) drug therapy: Secondary | ICD-10-CM | POA: Diagnosis not present

## 2017-11-12 DIAGNOSIS — Z7982 Long term (current) use of aspirin: Secondary | ICD-10-CM | POA: Diagnosis not present

## 2017-11-12 HISTORY — DX: Depression, unspecified: F32.A

## 2017-11-12 HISTORY — DX: Unspecified osteoarthritis, unspecified site: M19.90

## 2017-11-12 HISTORY — DX: Gastro-esophageal reflux disease without esophagitis: K21.9

## 2017-11-12 HISTORY — DX: Major depressive disorder, single episode, unspecified: F32.9

## 2017-11-12 HISTORY — DX: Dyspnea, unspecified: R06.00

## 2017-11-12 HISTORY — DX: Bronchitis, not specified as acute or chronic: J40

## 2017-11-12 HISTORY — DX: Transient cerebral ischemic attack, unspecified: G45.9

## 2017-11-12 HISTORY — DX: Peripheral vascular disease, unspecified: I73.9

## 2017-11-12 HISTORY — DX: Other allergic rhinitis: J30.89

## 2017-11-12 LAB — CREATININE, SERUM
Creatinine, Ser: 0.6 mg/dL (ref 0.44–1.00)
GFR calc Af Amer: >60 mL/min (ref >60)

## 2017-11-12 LAB — BUN: BUN: 7 mg/dL — ABNORMAL LOW (ref 8–23)

## 2017-11-13 MED ORDER — CEFAZOLIN SODIUM-DEXTROSE 2-4 GM/100ML-% IV SOLN
2.0000 g | Freq: Once | INTRAVENOUS | Status: AC
  Administered 2017-11-14: 2 g via INTRAVENOUS

## 2017-11-14 ENCOUNTER — Ambulatory Visit
Admission: RE | Admit: 2017-11-14 | Discharge: 2017-11-14 | Disposition: A | Discharge status: Home / Self Care | Payer: Medicare HMO | Source: Ambulatory Visit | Attending: Vascular Surgery | Admitting: Vascular Surgery

## 2017-11-14 ENCOUNTER — Encounter: Payer: Self-pay | Admitting: *Deleted

## 2017-11-14 ENCOUNTER — Encounter
Admission: RE | Disposition: A | Discharge status: Home / Self Care | Payer: Self-pay | Source: Ambulatory Visit | Attending: Vascular Surgery

## 2017-11-14 DIAGNOSIS — F17200 Nicotine dependence, unspecified, uncomplicated: Secondary | ICD-10-CM | POA: Diagnosis not present

## 2017-11-14 DIAGNOSIS — Z803 Family history of malignant neoplasm of breast: Secondary | ICD-10-CM | POA: Diagnosis not present

## 2017-11-14 DIAGNOSIS — Z9049 Acquired absence of other specified parts of digestive tract: Secondary | ICD-10-CM | POA: Diagnosis not present

## 2017-11-14 DIAGNOSIS — I10 Essential (primary) hypertension: Secondary | ICD-10-CM | POA: Insufficient documentation

## 2017-11-14 DIAGNOSIS — I70219 Atherosclerosis of native arteries of extremities with intermittent claudication, unspecified extremity: Secondary | ICD-10-CM

## 2017-11-14 DIAGNOSIS — Z9889 Other specified postprocedural states: Secondary | ICD-10-CM | POA: Insufficient documentation

## 2017-11-14 DIAGNOSIS — Z79899 Other long term (current) drug therapy: Secondary | ICD-10-CM | POA: Diagnosis not present

## 2017-11-14 DIAGNOSIS — Z7982 Long term (current) use of aspirin: Secondary | ICD-10-CM | POA: Diagnosis not present

## 2017-11-14 DIAGNOSIS — Z923 Personal history of irradiation: Secondary | ICD-10-CM | POA: Insufficient documentation

## 2017-11-14 DIAGNOSIS — I70213 Atherosclerosis of native arteries of extremities with intermittent claudication, bilateral legs: Secondary | ICD-10-CM | POA: Diagnosis not present

## 2017-11-14 HISTORY — PX: LOWER EXTREMITY ANGIOGRAPHY: CATH118251

## 2017-11-14 SURGERY — LOWER EXTREMITY ANGIOGRAPHY
Anesthesia: Moderate Sedation | Site: Leg Lower | Laterality: Left

## 2017-11-14 MED ORDER — ONDANSETRON HCL 4 MG/2ML IJ SOLN: 4.0000 mg | Freq: Four times a day (QID) | INTRAMUSCULAR | Status: DC | PRN

## 2017-11-14 MED ORDER — SODIUM CHLORIDE 0.9 % IV SOLN
INTRAVENOUS | Status: AC | PRN
  Administered 2017-11-14: 999 mL via INTRAVENOUS

## 2017-11-14 MED ORDER — IOPAMIDOL (ISOVUE-300) INJECTION 61%
INTRAVENOUS | Status: DC | PRN
  Administered 2017-11-14: 85 mL via INTRA_ARTERIAL

## 2017-11-14 MED ORDER — SODIUM CHLORIDE 0.9 % IV SOLN: 250.0000 mL | INTRAVENOUS | Status: DC | PRN

## 2017-11-14 MED ORDER — METHYLPREDNISOLONE SODIUM SUCC 125 MG IJ SOLR: 125.0000 mg | INTRAMUSCULAR | Status: DC | PRN

## 2017-11-14 MED ORDER — ACETAMINOPHEN 325 MG PO TABS: 650.0000 mg | ORAL_TABLET | ORAL | Status: DC | PRN

## 2017-11-14 MED ORDER — HEPARIN SODIUM (PORCINE) 1000 UNIT/ML IJ SOLN
INTRAMUSCULAR | Status: AC
  Filled 2017-11-14: qty 1

## 2017-11-14 MED ORDER — HEPARIN (PORCINE) IN NACL 1000-0.9 UT/500ML-% IV SOLN
INTRAVENOUS | Status: AC
  Filled 2017-11-14: qty 1000

## 2017-11-14 MED ORDER — MIDAZOLAM HCL 2 MG/2ML IJ SOLN
INTRAMUSCULAR | Status: DC | PRN
  Administered 2017-11-14: 1 mg via INTRAVENOUS
  Administered 2017-11-14: 2 mg via INTRAVENOUS
  Administered 2017-11-14: 1 mg via INTRAVENOUS

## 2017-11-14 MED ORDER — FENTANYL CITRATE (PF) 100 MCG/2ML IJ SOLN
INTRAMUSCULAR | Status: DC | PRN
  Administered 2017-11-14 (×2): 25 ug via INTRAVENOUS
  Administered 2017-11-14: 50 ug via INTRAVENOUS

## 2017-11-14 MED ORDER — SODIUM CHLORIDE 0.9 % IV SOLN: INTRAVENOUS | Status: DC

## 2017-11-14 MED ORDER — LIDOCAINE-EPINEPHRINE (PF) 1 %-1:200000 IJ SOLN
INTRAMUSCULAR | Status: AC
  Filled 2017-11-14: qty 30

## 2017-11-14 MED ORDER — HEPARIN SODIUM (PORCINE) 1000 UNIT/ML IJ SOLN
INTRAMUSCULAR | Status: DC | PRN
  Administered 2017-11-14: 2000 [IU] via INTRAVENOUS

## 2017-11-14 MED ORDER — FAMOTIDINE 20 MG PO TABS: 40.0000 mg | ORAL_TABLET | ORAL | Status: DC | PRN

## 2017-11-14 MED ORDER — FENTANYL CITRATE (PF) 100 MCG/2ML IJ SOLN
INTRAMUSCULAR | Status: AC
  Filled 2017-11-14: qty 2

## 2017-11-14 MED ORDER — CLOPIDOGREL BISULFATE 75 MG PO TABS: 75.0000 mg | ORAL_TABLET | Freq: Every day | ORAL | 11 refills | Status: DC

## 2017-11-14 MED ORDER — CLOPIDOGREL BISULFATE 75 MG PO TABS: 75.0000 mg | ORAL_TABLET | Freq: Every day | ORAL | Status: DC

## 2017-11-14 MED ORDER — CLOPIDOGREL BISULFATE 75 MG PO TABS
75.0000 mg | ORAL_TABLET | Freq: Every day | ORAL | Status: DC
  Administered 2017-11-14: 75 mg via ORAL

## 2017-11-14 MED ORDER — ATORVASTATIN CALCIUM 10 MG PO TABS: 10.0000 mg | ORAL_TABLET | Freq: Every day | ORAL | Status: DC

## 2017-11-14 MED ORDER — ATORVASTATIN CALCIUM 10 MG PO TABS: 10.0000 mg | ORAL_TABLET | Freq: Every day | ORAL | 11 refills | Status: DC

## 2017-11-14 MED ORDER — MIDAZOLAM HCL 5 MG/5ML IJ SOLN
INTRAMUSCULAR | Status: AC
  Filled 2017-11-14: qty 5

## 2017-11-14 MED ORDER — HYDROMORPHONE HCL 1 MG/ML IJ SOLN: 1.0000 mg | Freq: Once | INTRAMUSCULAR | Status: DC | PRN

## 2017-11-14 MED ORDER — HYDRALAZINE HCL 20 MG/ML IJ SOLN: 5.0000 mg | INTRAMUSCULAR | Status: DC | PRN

## 2017-11-14 MED ORDER — SODIUM CHLORIDE 0.9% FLUSH: 3.0000 mL | INTRAVENOUS | Status: DC | PRN

## 2017-11-14 MED ORDER — SODIUM CHLORIDE 0.9 % IV SOLN
INTRAVENOUS | Status: DC
  Administered 2017-11-14: 08:00:00 via INTRAVENOUS

## 2017-11-14 MED ORDER — SODIUM CHLORIDE 0.9% FLUSH: 3.0000 mL | Freq: Two times a day (BID) | INTRAVENOUS | Status: DC

## 2017-11-14 MED ORDER — LABETALOL HCL 5 MG/ML IV SOLN: 10.0000 mg | INTRAVENOUS | Status: DC | PRN

## 2017-11-14 MED ORDER — CLOPIDOGREL BISULFATE 75 MG PO TABS
ORAL_TABLET | ORAL | Status: AC
  Administered 2017-11-14: 75 mg via ORAL
  Filled 2017-11-14: qty 1

## 2017-11-14 SURGICAL SUPPLY — 25 items
BALLN LUTONIX 5X150X130 (BALLOONS) ×2
BALLN LUTONIX 5X220X130 (BALLOONS) ×2
BALLN LUTONIX DCB 6X80X130 (BALLOONS) ×4
BALLN LUTONIX DCB 7X40X130 (BALLOONS) ×2
BALLOON LUTONIX 5X150X130 (BALLOONS) ×1 IMPLANT
BALLOON LUTONIX 5X220X130 (BALLOONS) ×1 IMPLANT
BALLOON LUTONIX DCB 6X80X130 (BALLOONS) ×2 IMPLANT
BALLOON LUTONIX DCB 7X40X130 (BALLOONS) ×1 IMPLANT
CATH BEACON 5 .035 65 RIM TIP (CATHETERS) ×2 IMPLANT
CATH CXI 4F 90 DAV (CATHETERS) ×2 IMPLANT
CATH PIG 70CM (CATHETERS) ×2 IMPLANT
COVER PROBE U/S 5X48 (MISCELLANEOUS) ×2 IMPLANT
DEVICE PRESTO INFLATION (MISCELLANEOUS) ×2 IMPLANT
DEVICE STARCLOSE SE CLOSURE (Vascular Products) ×2 IMPLANT
GLIDEWIRE ADV .035X180CM (WIRE) ×2 IMPLANT
LIFESTENT SOLO 6X200X135 (Permanent Stent) ×2 IMPLANT
PACK ANGIOGRAPHY (CUSTOM PROCEDURE TRAY) ×2 IMPLANT
SHEATH BRITE TIP 5FRX11 (SHEATH) ×2 IMPLANT
SHEATH PINNACLE ST 6F 45CM (SHEATH) ×2 IMPLANT
STENT LIFESTREAM 6X37X80 (Permanent Stent) ×2 IMPLANT
SYR MEDRAD MARK V 150ML (SYRINGE) ×2 IMPLANT
TOWEL OR 17X26 4PK STRL BLUE (TOWEL DISPOSABLE) ×2 IMPLANT
TUBING CONTRAST HIGH PRESS 72 (TUBING) ×2 IMPLANT
WIRE J 3MM .035X145CM (WIRE) ×2 IMPLANT
WIRE MAGIC TORQUE 260C (WIRE) ×2 IMPLANT

## 2017-11-14 NOTE — Op Note (Signed)
[facility]  Percutaneous Study/Intervention Procedural Note   Date of Surgery: 11/14/2017  Surgeon(s):DEW,JASON    Assistants:none  Pre-operative Diagnosis: PAD with claudication bilateral lower extremities  Post-operative diagnosis:  Same  Procedure(s) Performed:             1.  Ultrasound guidance for vascular access right femoral artery             2.  Catheter placement into left SFA from right femoral approach             3.  Aortogram and selective bilateral lower extremity angiograms             4.  Percutaneous transluminal angioplasty of the left SFA and common femoral artery with two 5 mm diameter Lutonix drug-coated angioplasty balloons             5.   Stent placement to the left SFA with a 6 mm diameter by 20 cm length life stent  6.  Lifestream stent placement to the left common iliac artery with a 6 mm diameter by 37 mm length stent postdilated with a 7 mm diameter Lutonix drug-coated angioplasty balloon  7.  Percutaneous transluminal angioplasty of the left external iliac artery with 6 mm diameter by 8 cm length Lutonix drug-coated angioplasty balloon  8.  Percutaneous transluminal angioplasty of the right external iliac artery with 6 mm diameter by 8 cm length Lutonix drug-coated angioplasty balloon             7.  StarClose closure device right femoral artery  EBL: 10 cc  Contrast: 85 cc  Fluoro Time: 8.6 minutes  Moderate Conscious Sedation Time: approximately 60 minutes using 4 mg of Versed and 100 Mcg of Fentanyl              Indications:  Patient is a 67 y.o.female with disabling claudication of both lower extremities. The patient has noninvasive study showing markedly reduced ABIs bilaterally at 0.5 range. The patient is brought in for angiography for further evaluation and potential treatment. Risks and benefits are discussed and informed consent is obtained.   Procedure:  The patient was identified and appropriate procedural time  out was performed.  The patient was then placed supine on the table and prepped and draped in the usual sterile fashion. Moderate conscious sedation was administered during a face to face encounter with the patient throughout the procedure with my supervision of the RN administering medicines and monitoring the patient's vital signs, pulse oximetry, telemetry and mental status throughout from the start of the procedure until the patient was taken to the recovery room. Ultrasound was used to evaluate the right common femoral artery.  It was significantly diseased.  A digital ultrasound image was acquired.  A Seldinger needle was used to access the right common femoral artery under direct ultrasound guidance and a permanent image was performed.  A 0.035 J wire was advanced without resistance and a 5Fr sheath was placed.  Pigtail catheter was placed into the aorta and an AP aortogram was performed. This demonstrated normal renal arteries bilaterally.  The aorta is moderately calcific but not stenotic.  The right common iliac artery does not have significant stenosis but the right external iliac artery has 70 to 80% stenosis.  The left common iliac artery has about a 60 to 65% stenosis.  The left external iliac artery has a 90 to 95% stenosis.  Both common femoral arteries are heavily diseased. I then crossed  the aortic bifurcation and advanced to the left femoral head and then advanced into the left proximal SFA to help opacify the distal vessels which was very sluggish. Selective left lower extremity angiogram was then performed. This demonstrated 70 to 80% stenosis or possibly greater of the left common femoral artery extending down into the proximal SFA and to a lesser degree the profunda femoris artery.  The SFA then had a long segment occlusion and reconstituted at Hunter's canal.  There was then two-vessel runoff to the foot with both the anterior tibial and posterior tibial arteries. It was felt that it was in  the patient's best interest to proceed with intervention after these images to avoid a second procedure and a larger amount of contrast and fluoroscopy based off of the findings from the initial angiogram.  I did want to go ahead and evaluate the right lower extremity as she was scheduled to come back to have this treated in a few weeks, and with her common femoral disease we may or may not elect to treat this percutaneously.  Through the 5 French sheath a right lower extremity angiogram was performed. This demonstrated 70% to 80% stenosis of the common femoral artery extending down into the proximal profunda femoris artery.  There is a flush occlusion of the right SFA with reconstitution at Hunter's canal.  Distal opacification was fairly sluggish but there appeared to be a peroneal artery and a posterior tibial artery continuous distally. The patient was systemically heparinized and a 6 [place] Ansell sheath was then placed over the [employer] wire. I then used a Kumpe catheter and the advantage wire to navigate through the common femoral disease and the SFA occlusion and confirm intraluminal flow the popliteal artery.  I then replaced a long Magic torque wire into the tibial vessels and proceeded with treatment.  Two 5 mm diameter Lutonix drug-coated angioplasty balloons were used to treat the left common femoral artery and the entire SFA.  One was 22 cm in length and one was 15 cm in length.  Both inflations were about 8 to 10 atm for 1 minute.  I then performed imaging which showed residual stenosis in the proximal SFA as well as in the mid to distal SFA.  A 6 mm diameter by 20 cm length life stent was then deployed in the SFA to encompass these lesions and postdilated with a 5 mm balloon with excellent angiographic completion result and less than 10% residual stenosis.  I then turned my attention to the left iliac system.  A 6 mm diameter by 37 mm length lifestream stent was deployed in the left proximal  common iliac artery just below its origin to encompass the common iliac lesion.  This was postdilated with a 7 mm diameter by 4 cm length Lutonix drug-coated angioplasty balloon with no residual stenosis.  A 6 mm diameter by 8 cm length Lutonix drug-coated angioplasty balloon was inflated in the left external iliac artery down to the proximal common femoral artery.  This was taken to 8 atm for 1 minute.  Completion imaging showed only about a 20% residual stenosis in the external iliac artery.  There is still about a 50 to 60% residual stenosis in the common femoral artery in the mid segment, but this was in an area where no stent would be placed and this moderate residual stenosis was left.  The sheath was then pulled back to the proximal right common femoral artery and the right external iliac lesion was  addressed.  A 6 mm diameter by 8 cm length Lutonix drug-coated angioplasty balloon was inflated to 10 atm for 1 minute.  Completion imaging showed only about a 15 to 20% residual stenosis in the right artery.  I elected to terminate the procedure. The sheath was removed and StarClose closure device was deployed in the right femoral artery with excellent hemostatic result. The patient was taken to the recovery room in stable condition having tolerated the procedure well.  Findings:               Aortogram:  Normal renal arteries bilaterally.  The aorta is moderately calcific but not stenotic.  The right common iliac artery does not have significant stenosis but the right external iliac artery has 70 to 80% stenosis.  The left common iliac artery has about a 60 to 65% stenosis.  The left external iliac artery has a 90 to 95% stenosis.  Both common femoral arteries are heavily diseased             Left Lower Extremity:  This demonstrated 70 to 80% stenosis or possibly greater of the left common femoral artery extending down into the proximal SFA and to a lesser degree the profunda femoris artery.  The SFA then  had a long segment occlusion and reconstituted at Hunter's canal.  There was then two-vessel runoff to the foot with both the anterior tibial and posterior tibial arteries  Right lower extremity: This demonstrated 70% to 80% stenosis of the common femoral artery extending down into the proximal profunda femoris artery.  There is a flush occlusion of the right SFA with reconstitution at Hunter's canal.  Distal opacification was fairly sluggish but there appeared to be a peroneal artery and a posterior tibial artery continuous distally.   Disposition: Patient was taken to the recovery room in stable condition having tolerated the procedure well.  Complications: None  [name] 11/14/2017 10:19 AM   This note was created with Dragon Medical transcription system. Any errors in dictation are purely unintentional.

## 2017-11-14 NOTE — H&P (Signed)
 [facility] History & Physical Update  The patient was interviewed and re-examined.  The patient's previous History and Physical has been reviewed and is unchanged.  There is no change in the plan of care. We plan to proceed with the scheduled procedure.  [name], MD  11/14/2017, 8:19 AM

## 2017-11-15 ENCOUNTER — Encounter: Payer: Self-pay | Admitting: Vascular Surgery

## 2017-11-18 ENCOUNTER — Other Ambulatory Visit (INDEPENDENT_AMBULATORY_CARE_PROVIDER_SITE_OTHER): Payer: Self-pay | Admitting: Vascular Surgery

## 2017-11-20 MED ORDER — CEFAZOLIN SODIUM-DEXTROSE 2-4 GM/100ML-% IV SOLN: 2.0000 g | Freq: Once | INTRAVENOUS | Status: DC

## 2017-11-21 ENCOUNTER — Ambulatory Visit
Admission: RE | Admit: 2017-11-21 | Payer: BLUE CROSS/BLUE SHIELD | Source: Ambulatory Visit | Admitting: Vascular Surgery

## 2017-11-21 ENCOUNTER — Encounter: Admission: RE | Payer: Self-pay | Source: Ambulatory Visit

## 2017-11-21 SURGERY — LOWER EXTREMITY ANGIOGRAPHY
Anesthesia: Moderate Sedation | Site: Leg Lower | Laterality: Right

## 2017-12-11 ENCOUNTER — Other Ambulatory Visit (INDEPENDENT_AMBULATORY_CARE_PROVIDER_SITE_OTHER): Payer: Self-pay | Admitting: Vascular Surgery

## 2017-12-11 DIAGNOSIS — I739 Peripheral vascular disease, unspecified: Secondary | ICD-10-CM

## 2017-12-13 ENCOUNTER — Ambulatory Visit (INDEPENDENT_AMBULATORY_CARE_PROVIDER_SITE_OTHER): Payer: Medicare HMO | Admitting: Vascular Surgery

## 2017-12-13 ENCOUNTER — Encounter (INDEPENDENT_AMBULATORY_CARE_PROVIDER_SITE_OTHER): Payer: Self-pay | Admitting: Vascular Surgery

## 2017-12-13 ENCOUNTER — Ambulatory Visit (INDEPENDENT_AMBULATORY_CARE_PROVIDER_SITE_OTHER): Payer: Medicare HMO

## 2017-12-13 VITALS — BP 134/82 | HR 75 | Resp 16 | Ht 64.0 in | Wt 152.0 lb

## 2017-12-13 DIAGNOSIS — F17200 Nicotine dependence, unspecified, uncomplicated: Secondary | ICD-10-CM

## 2017-12-13 DIAGNOSIS — I10 Essential (primary) hypertension: Secondary | ICD-10-CM

## 2017-12-13 DIAGNOSIS — I70213 Atherosclerosis of native arteries of extremities with intermittent claudication, bilateral legs: Secondary | ICD-10-CM | POA: Diagnosis not present

## 2017-12-13 DIAGNOSIS — I739 Peripheral vascular disease, unspecified: Secondary | ICD-10-CM

## 2017-12-13 NOTE — Assessment & Plan Note (Signed)
Her ABIs today are significantly improved up to 0.69 on the right but her digit pressure is 92.  On the left, her ABIs up to 0.87 with a digit pressure of 109.  Her ABIs were in the 0.5 range previously. Symptomatically, she is much better.  She is going to continue her aspirin and Plavix.  Although I am a little hesitant to stop the Lipitor, if she is having an allergic reaction to it we will try stopping it.  I will have a short in follow-up of about 3 months to see her back.  She still has some infra inguinal disease on the right and common femoral disease bilaterally but at current with markedly improved symptoms no further intervention is immediately planned.

## 2017-12-13 NOTE — Progress Notes (Signed)
MRN : [id number]  Joan [name] is a 67 y.o. ([birth year]) female who presents with chief complaint of  Chief Complaint  Patient presents with  . Follow-up    [facility] 3week ABI  .  History of Present Illness: Patient returns today in follow up of PAD.  Her claudication symptoms are markedly improved in both lower extremities after an angiogram with extensive left lower extremity revascularization as well as a right iliac angioplasty a few weeks ago.  She had no periprocedural complications.  She has had some swelling in the left leg although it is getting better.  She is extremely pleased with how good her legs feel.  Her ABIs today are significantly improved up to 0.69 on the right but her digit pressure is 92.  On the left, her ABIs up to 0.87 with a digit pressure of 109.  Her ABIs were in the 0.5 range previously.  She does think she is having an adverse reaction to the Lipitor and asks about stopping this.  Current Outpatient Medications  Medication Sig Dispense Refill  . ALPRAZolam (XANAX) 0.5 MG tablet   0  . aspirin EC 81 MG tablet Take by mouth.    Marland Kitchen atorvastatin (LIPITOR) 10 MG tablet Take 1 tablet (10 mg total) by mouth daily. 30 tablet 11  . clopidogrel (PLAVIX) 75 MG tablet Take 1 tablet (75 mg total) by mouth daily. 30 tablet 11  . lisinopril (PRINIVIL,ZESTRIL) 20 MG tablet Take 20 mg by mouth daily.    Marland Kitchen loratadine (CLARITIN) 10 MG tablet Take by mouth.    . sertraline (ZOLOFT) 100 MG tablet Take by mouth.     No current facility-administered medications for this visit.     Past Medical History:  Diagnosis Date  . Arthritis   . Breast cancer, right breast [facility])    with radiation therapy  . Bronchitis   . Cancer ([place])   . Claudication, intermittent ([place])   . Depression   . Dyspnea   . Environmental and seasonal allergies   . GERD (gastroesophageal reflux disease)    with spicy food  . Hepatitis C antibody test positive   . Hypertension   . Peripheral vascular  disease ([place])   . Personal history of radiation therapy 2002   RIGHT lumpectomy  . TIA (transient ischemic attack)     Past Surgical History:  Procedure Laterality Date  . APPENDECTOMY    . BREAST EXCISIONAL BIOPSY Right    11/1999  . BREAST LUMPECTOMY Right 2002   RIGHT lumpectomy w/ radiation 2002  . BREAST SURGERY    . CHOLECYSTECTOMY    . LOWER EXTREMITY ANGIOGRAPHY Left 11/14/2017   Procedure: LOWER EXTREMITY ANGIOGRAPHY;  Surgeon: Algernon Huxley, MD;  Location: [facility];  Service: Cardiovascular;  Laterality: Left;   Family History  Problem Relation Age of Onset  . Breast cancer Mother 55  . Breast cancer Maternal Aunt   . Breast cancer Maternal Grandmother   . Breast cancer Sister 21    Social History Social History        Tobacco Use  . Smoking status: Current Every Day Smoker  . Smokeless tobacco: Never Used  Substance Use Topics  . Alcohol use: Not Currently    Comment: Beer heavy everyday  . Drug use: No  Still working  No Known Allergies          REVIEW OF SYSTEMS (Negative unless checked)  Constitutional: _0 Weight loss  _1 Fever  _2 Chills Cardiac: _3   Chest pain   _0 Chest pressure   _1 Palpitations   _2 Shortness of breath when laying flat   _3 Shortness of breath at rest   _4 Shortness of breath with exertion. Vascular:  _5 Pain in legs with walking   _6 Pain in legs at rest   _7 Pain in legs when laying flat   _8 Claudication   _9 Pain in feet when walking  _10 Pain in feet at rest  _11 Pain in feet when laying flat   _12 History of DVT   _13 Phlebitis   _14 Swelling in legs   _15 Varicose veins   _16 Non-healing ulcers Pulmonary:   _17 Uses home oxygen   _18 Productive cough   _19 Hemoptysis   _20 Wheeze  _21 COPD   _22 Asthma Neurologic:  _23 Dizziness  _24 Blackouts   _25 Seizures   _26 History of stroke   _27 History of TIA  _28 Aphasia   _29 Temporary blindness   _30 Dysphagia   _31 Weakness or numbness in arms   _32 Weakness or numbness in legs Musculoskeletal:  _33 Arthritis    _34 Joint swelling   _35 Joint pain   _36 Low back pain Hematologic:  _37 Easy bruising  _38 Easy bleeding   _39 Hypercoagulable state   _40 Anemic  _41 Hepatitis Gastrointestinal:  _42 Blood in stool   _43 Vomiting blood  _44 Gastroesophageal reflux/heartburn   _45 Abdominal pain Genitourinary:  _46 Chronic kidney disease   _47 Difficult urination  _48 Frequent urination  _49 Burning with urination   _50 Hematuria Skin:  _51 Rashes   _52 Ulcers   _53 Wounds Psychological:  _54 History of anxiety   _55  History of major depression.     Physical Examination  BP 134/82 (BP Location: Right Arm)   Pulse 75   Resp 16   Ht _56  (1.626 m)   Wt 152 lb (68.9 kg)   BMI 26.09 kg/m  Gen:  WD/WN, NAD Head: [place]/AT, No temporalis wasting. Ear/Nose/Throat: Hearing grossly intact, nares w/o erythema or drainage Eyes: Conjunctiva clear. Sclera non-icteric Neck: Supple.  Trachea midline Pulmonary:  Good air movement, no use of accessory muscles.  Cardiac: RRR, no JVD Vascular:  Vessel Right Left  Radial Palpable Palpable                          PT  1+ palpable  1+ palpable  DP  1+ palpable  2+ palpable    Musculoskeletal: M/S 5/5 throughout.  No deformity or atrophy.  No right lower extremity edema.  1+ left lower extremity edema. Neurologic: Sensation grossly intact in extremities.  Symmetrical.  Speech is fluent.  Psychiatric: Judgment intact, Mood & affect appropriate for pt's clinical situation. Dermatologic: No rashes or ulcers noted.  No cellulitis or open wounds.  Access site is well-healed       Labs Recent Results (from the past 2160 hour(s))  BUN     Status: Abnormal   Collection Time: 11/12/17  9:48 AM  Result Value Ref Range   BUN 7 (L) 8 - 23 mg/dL    Comment: Performed at [facility], [address] [place], [place] [address]  Creatinine, serum     Status: None   Collection Time: 11/12/17  9:48 AM  Result Value Ref Range   Creatinine, Ser 0.60 0.44 - 1.00 mg/dL   GFR calc non Af Amer >60 >60  mL/min   GFR calc Af Amer >60 >60 mL/min    Comment: (NOTE) The eGFR has been calculated using the CKD EPI equation. This calculation has not been validated in all clinical situations. eGFR's persistently <60 mL/min signify possible Chronic Kidney Disease. Performed at [facility], [address],  [place], [place] [address]     Radiology Burnard Bunting With/wo Tbi  Result Date: 12/13/2017 LOWER EXTREMITY DOPPLER STUDY Indications: Peripheral artery disease.  Vascular Interventions: Surgery 11/14/2017, Aortagram and Angiogram                         bilaterally. Performing Technologist: Almira Coaster RVS  Examination Guidelines: A complete evaluation includes at minimum, Doppler waveform signals and systolic blood pressure reading at the level of bilateral brachial, anterior tibial, and posterior tibial arteries, when vessel segments are accessible. Bilateral testing is considered an integral part of a complete examination. Photoelectric Plethysmograph (PPG) waveforms and toe systolic pressure readings are included as required and additional duplex testing as needed. Limited examinations for reoccurring indications may be performed as noted.  ABI Findings: +---------+------------------+-----+----------+--------+ Right    Rt Pressure (mmHg)IndexWaveform  Comment  +---------+------------------+-----+----------+--------+ Brachial 159                                       +---------+------------------+-----+----------+--------+ ATA      103               0.65 monophasic         +---------+------------------+-----+----------+--------+ PTA      109               0.69 monophasic         +---------+------------------+-----+----------+--------+ Great Toe92                0.58 Abnormal           +---------+------------------+-----+----------+--------+ +---------+------------------+-----+----------+-------+ Left     Lt Pressure (mmHg)IndexWaveform  Comment  +---------+------------------+-----+----------+-------+ Brachial 155                                      +---------+------------------+-----+----------+-------+ ATA      135               0.85 biphasic          +---------+------------------+-----+----------+-------+ PTA      139               0.87 monophasic        +---------+------------------+-----+----------+-------+ Great Toe109               0.69 Abnormal          +---------+------------------+-----+----------+-------+ +-------+-----------+-----------+------------+------------+ ABI/TBIToday's ABIToday's TBIPrevious ABIPrevious TBI +-------+-----------+-----------+------------+------------+ Right  .69        .58                                 +-------+-----------+-----------+------------+------------+ Left   .87        .69                                 +-------+-----------+-----------+------------+------------+  Summary: Right: Resting right ankle-brachial index indicates moderate right lower extremity arterial disease. The right toe-brachial index is abnormal. Left: Resting left ankle-brachial index indicates mild left lower extremity arterial disease. The left toe-brachial index is abnormal.  *See table(s) above for measurements and observations.  Electronically signed by [name] MD on 12/13/2017 at 4:26:30 PM.   Final     Assessment/Plan  Tobacco use disorder Clearly a major atherosclerotic  risk factor.  Patient understands the need to quit.  Hypertension blood pressure control important in reducing the progression of atherosclerotic disease. On appropriate oral medications.   Atherosclerosis of native arteries of extremity with intermittent claudication (HCC) Her ABIs today are significantly improved up to 0.69 on the right but her digit pressure is 92.  On the left, her ABIs up to 0.87 with a digit pressure of 109.  Her ABIs were in the 0.5 range previously. Symptomatically, she is much better.   She is going to continue her aspirin and Plavix.  Although I am a little hesitant to stop the Lipitor, if she is having an allergic reaction to it we will try stopping it.  I will have a short in follow-up of about 3 months to see her back.  She still has some infra inguinal disease on the right and common femoral disease bilaterally but at current with markedly improved symptoms no further intervention is immediately planned.    [name], MD  12/13/2017 4:40 PM    This note was created with Dragon medical transcription system.  Any errors from dictation are purely unintentional

## 2017-12-13 NOTE — Assessment & Plan Note (Signed)
blood pressure control important in reducing the progression of atherosclerotic disease. On appropriate oral medications.  

## 2017-12-13 NOTE — Assessment & Plan Note (Signed)
Clearly a major atherosclerotic risk factor.  Patient understands the need to quit.

## 2017-12-13 NOTE — Patient Instructions (Signed)

## 2018-02-10 DIAGNOSIS — F3342 Major depressive disorder, recurrent, in full remission: Secondary | ICD-10-CM | POA: Diagnosis not present

## 2018-02-10 DIAGNOSIS — I739 Peripheral vascular disease, unspecified: Secondary | ICD-10-CM | POA: Insufficient documentation

## 2018-02-10 DIAGNOSIS — F325 Major depressive disorder, single episode, in full remission: Secondary | ICD-10-CM | POA: Diagnosis not present

## 2018-02-10 DIAGNOSIS — I10 Essential (primary) hypertension: Secondary | ICD-10-CM | POA: Diagnosis not present

## 2018-02-10 DIAGNOSIS — Z853 Personal history of malignant neoplasm of breast: Secondary | ICD-10-CM | POA: Diagnosis not present

## 2018-02-10 DIAGNOSIS — J209 Acute bronchitis, unspecified: Secondary | ICD-10-CM | POA: Diagnosis not present

## 2018-02-10 DIAGNOSIS — Z0000 Encounter for general adult medical examination without abnormal findings: Secondary | ICD-10-CM | POA: Diagnosis not present

## 2018-02-10 DIAGNOSIS — Z720 Tobacco use: Secondary | ICD-10-CM | POA: Diagnosis not present

## 2018-03-18 ENCOUNTER — Ambulatory Visit (INDEPENDENT_AMBULATORY_CARE_PROVIDER_SITE_OTHER): Payer: Medicare HMO | Admitting: Vascular Surgery

## 2018-03-18 ENCOUNTER — Other Ambulatory Visit: Payer: Self-pay

## 2018-03-18 ENCOUNTER — Encounter (INDEPENDENT_AMBULATORY_CARE_PROVIDER_SITE_OTHER): Payer: Self-pay | Admitting: Vascular Surgery

## 2018-03-18 ENCOUNTER — Ambulatory Visit (INDEPENDENT_AMBULATORY_CARE_PROVIDER_SITE_OTHER): Payer: Medicare HMO

## 2018-03-18 VITALS — BP 114/75 | HR 73 | Resp 16 | Ht 64.0 in | Wt 151.4 lb

## 2018-03-18 DIAGNOSIS — F17210 Nicotine dependence, cigarettes, uncomplicated: Secondary | ICD-10-CM | POA: Diagnosis not present

## 2018-03-18 DIAGNOSIS — I70213 Atherosclerosis of native arteries of extremities with intermittent claudication, bilateral legs: Secondary | ICD-10-CM | POA: Diagnosis not present

## 2018-03-18 DIAGNOSIS — F17200 Nicotine dependence, unspecified, uncomplicated: Secondary | ICD-10-CM | POA: Diagnosis not present

## 2018-03-18 DIAGNOSIS — I10 Essential (primary) hypertension: Secondary | ICD-10-CM

## 2018-03-18 NOTE — Assessment & Plan Note (Signed)
ABIs today were stable at 0.65 on the right and 0.91 on the left.  Minimal claudication symptoms currently.  No role for intervention.  Smoking cessation recommended.  Recheck in 6 months.  Continue current medical regimen

## 2018-03-18 NOTE — Progress Notes (Signed)
MRN : [id number]  Joan [name] is a 68 y.o. (08-Apr-1950) female who presents with chief complaint of  Chief Complaint  Patient presents with  . Follow-up    48month abi  .  History of Present Illness: Patient returns today in follow up of PAD.  She underwent revascularization about 4 months ago with marked improvement in her symptoms.  No current limb threatening symptoms.  Is still smoking but understands she needs to quit. ABIs today were stable at 0.65 on the right and 0.91 on the left.  Current Outpatient Medications  Medication Sig Dispense Refill  . ALPRAZolam (XANAX) 0.5 MG tablet   0  . aspirin EC 81 MG tablet Take by mouth.    Marland Kitchen atorvastatin (LIPITOR) 10 MG tablet Take 1 tablet (10 mg total) by mouth daily. 30 tablet 11  . clopidogrel (PLAVIX) 75 MG tablet Take 1 tablet (75 mg total) by mouth daily. 30 tablet 11  . lisinopril (PRINIVIL,ZESTRIL) 20 MG tablet Take 20 mg by mouth daily.    Marland Kitchen loratadine (CLARITIN) 10 MG tablet Take by mouth.    . sertraline (ZOLOFT) 100 MG tablet Take by mouth.     No current facility-administered medications for this visit.     Past Medical History:  Diagnosis Date  . Arthritis   . Breast cancer, right breast [facility])    with radiation therapy  . Bronchitis   . Cancer ([place])   . Claudication, intermittent ([place])   . Depression   . Dyspnea   . Environmental and seasonal allergies   . GERD (gastroesophageal reflux disease)    with spicy food  . Hepatitis C antibody test positive   . Hypertension   . Peripheral vascular disease ([place])   . Personal history of radiation therapy 2002   RIGHT lumpectomy  . TIA (transient ischemic attack)     Past Surgical History:  Procedure Laterality Date  . APPENDECTOMY    . BREAST EXCISIONAL BIOPSY Right    11/1999  . BREAST LUMPECTOMY Right 2002   RIGHT lumpectomy w/ radiation 2002  . BREAST SURGERY    . CHOLECYSTECTOMY    . LOWER EXTREMITY ANGIOGRAPHY Left 11/14/2017   Procedure: LOWER  EXTREMITY ANGIOGRAPHY;  Surgeon: Algernon Huxley, MD;  Location: [facility];  Service: Cardiovascular;  Laterality: Left;   Family History  Problem Relation Age of Onset  . Breast cancer Mother 44  . Breast cancer Maternal Aunt   . Breast cancer Maternal Grandmother   . Breast cancer Sister 69    Social History Social History        Tobacco Use  . Smoking status: Current Every Day Smoker  . Smokeless tobacco: Never Used  Substance Use Topics  . Alcohol use: Not Currently    Comment: Beer heavy everyday  . Drug use: No  Still working  No Known Allergies          REVIEW OF SYSTEMS(Negative unless checked)  Constitutional: [] ?Weight loss[] ?Fever[] ?Chills Cardiac:[] ?Chest pain[] ?Chest pressure[] ?Palpitations [] ?Shortness of breath when laying flat [] ?Shortness of breath at rest [] ?Shortness of breath with exertion. Vascular: [] ?Pain in legs with walking[] ?Pain in legsat rest[] ?Pain in legs when laying flat [x] ?Claudication [] ?Pain in feet when walking [x] ?Pain in feet at rest [] ?Pain in feet when laying flat [] ?History of DVT [] ?Phlebitis [] ?Swelling in legs [] ?Varicose veins [] ?Non-healing ulcers Pulmonary: [] ?Uses home oxygen [] ?Productive cough[] ?Hemoptysis [] ?Wheeze [] ?COPD [] ?Asthma Neurologic: [] ?Dizziness [] ?Blackouts [] ?Seizures [] ?History of stroke [] ?History of TIA[] ?Aphasia [] ?Temporary blindness[] ?Dysphagia [] ?Weaknessor numbness in arms [] ?Weakness  or numbnessin legs Musculoskeletal: [x] ?Arthritis [] ?Joint swelling [] ?Joint pain [] ?Low back pain Hematologic:[] ?Easy bruising[] ?Easy bleeding [] ?Hypercoagulable state [] ?Anemic [] ?Hepatitis Gastrointestinal:[] ?Blood in stool[] ?Vomiting blood[] ?Gastroesophageal reflux/heartburn[] ?Abdominal pain Genitourinary: [] ?Chronic kidney disease [] ?Difficulturination [] ?Frequenturination [] ?Burning  with urination[] ?Hematuria Skin: [] ?Rashes [] ?Ulcers [] ?Wounds Psychological: [] ?History of anxiety[] ?History of major depression.     Physical Examination  BP 114/75 (BP Location: Right Arm)   Pulse 73   Resp 16   Ht 5\' 4"  (1.626 m)   Wt 151 lb 6.4 oz (68.7 kg)   BMI 25.99 kg/m  Gen:  WD/WN, NAD Head: [place]/AT, No temporalis wasting. Ear/Nose/Throat: Hearing grossly intact, nares w/o erythema or drainage Eyes: Conjunctiva clear. Sclera non-icteric Neck: Supple.  Trachea midline Pulmonary:  Good air movement, no use of accessory muscles.  Cardiac: RRR, no JVD Vascular:  Vessel Right Left  Radial Palpable Palpable                          PT 1+ Palpable 1+ Palpable  DP 1+ Palpable Palpable   Gastrointestinal: soft, non-tender/non-distended. No guarding/reflex.  Musculoskeletal: M/S 5/5 throughout.  No deformity or atrophy. No edema. Neurologic: Sensation grossly intact in extremities.  Symmetrical.  Speech is fluent.  Psychiatric: Judgment intact, Mood & affect appropriate for pt's clinical situation. Dermatologic: No rashes or ulcers noted.  No cellulitis or open wounds.       Labs No results found for this or any previous visit (from the past 2160 hour(s)).  Radiology No results found.  Assessment/Plan  Tobacco use disorder We had a discussion for approximately 3 minutes regarding the absolute need for smoking cessation due to the deleterious nature of tobacco on the vascular system. We discussed the tobacco use would diminish patency of any intervention, and likely significantly worsen progressio of disease. We discussed multiple agents for quitting including replacement therapy or medications to reduce cravings such as Chantix. The patient voices their understanding of the importance of smoking cessation.   Hypertension blood pressure control important in reducing the progression of atherosclerotic disease. On appropriate oral  medications.   Atherosclerosis of native arteries of extremity with intermittent claudication (HCC) ABIs today were stable at 0.65 on the right and 0.91 on the left.  Minimal claudication symptoms currently.  No role for intervention.  Smoking cessation recommended.  Recheck in 6 months.  Continue current medical regimen    [name], MD  03/18/2018 4:25 PM    This note was created with Dragon medical transcription system.  Any errors from dictation are purely unintentional

## 2018-03-18 NOTE — Patient Instructions (Signed)
Peripheral Vascular Disease  Peripheral vascular disease (PVD) is a disease of the blood vessels that are not part of your heart and brain. A simple term for PVD is poor circulation. In most cases, PVD narrows the blood vessels that carry blood from your heart to the rest of your body. This can reduce the supply of blood to your arms, legs, and internal organs, like your stomach or kidneys. However, PVD most often affects a person's lower legs and feet. Without treatment, PVD tends to get worse. PVD can also lead to acute ischemic limb. This is when an arm or leg suddenly cannot get enough blood. This is a medical emergency. Follow these instructions at home: Lifestyle  Do not use any products that contain nicotine or tobacco, such as cigarettes and e-cigarettes. If you need help quitting, ask your doctor.  Lose weight if you are overweight. Or, stay at a healthy weight as told by your doctor.  Eat a diet that is low in fat and cholesterol. If you need help, ask your doctor.  Exercise regularly. Ask your doctor for activities that are right for you. General instructions  Take over-the-counter and prescription medicines only as told by your doctor.  Take good care of your feet: ? Wear comfortable shoes that fit well. ? Check your feet often for any cuts or sores.  Keep all follow-up visits as told by your doctor This is important. Contact a doctor if:  You have cramps in your legs when you walk.  You have leg pain when you are at rest.  You have coldness in a leg or foot.  Your skin changes.  You are unable to get or have an erection (erectile dysfunction).  You have cuts or sores on your feet that do not heal. Get help right away if:  Your arm or leg turns cold, numb, and blue.  Your arms or legs become red, warm, swollen, painful, or numb.  You have chest pain.  You have trouble breathing.  You suddenly have weakness in your face, arm, or leg.  You become very  confused or you cannot speak.  You suddenly have a very bad headache.  You suddenly cannot see. Summary  Peripheral vascular disease (PVD) is a disease of the blood vessels.  A simple term for PVD is poor circulation. Without treatment, PVD tends to get worse.  Treatment may include exercise, low fat and low cholesterol diet, and quitting smoking. This information is not intended to replace advice given to you by your health care provider. Make sure you discuss any questions you have with your health care provider. Document Released: 04/04/2009 Document Revised: 02/16/2016 Document Reviewed: 02/16/2016 Elsevier Interactive Patient Education  2019 [employer]  

## 2018-03-18 NOTE — Assessment & Plan Note (Signed)

## 2018-03-18 NOTE — Assessment & Plan Note (Signed)
blood pressure control important in reducing the progression of atherosclerotic disease. On appropriate oral medications.  

## 2018-06-05 DIAGNOSIS — J209 Acute bronchitis, unspecified: Secondary | ICD-10-CM | POA: Diagnosis not present

## 2018-06-05 DIAGNOSIS — Z853 Personal history of malignant neoplasm of breast: Secondary | ICD-10-CM | POA: Diagnosis not present

## 2018-06-05 DIAGNOSIS — I739 Peripheral vascular disease, unspecified: Secondary | ICD-10-CM | POA: Diagnosis not present

## 2018-06-05 DIAGNOSIS — Z720 Tobacco use: Secondary | ICD-10-CM | POA: Diagnosis not present

## 2018-06-05 DIAGNOSIS — I10 Essential (primary) hypertension: Secondary | ICD-10-CM | POA: Diagnosis not present

## 2018-06-12 DIAGNOSIS — F3340 Major depressive disorder, recurrent, in remission, unspecified: Secondary | ICD-10-CM | POA: Insufficient documentation

## 2018-06-12 DIAGNOSIS — Z853 Personal history of malignant neoplasm of breast: Secondary | ICD-10-CM | POA: Diagnosis not present

## 2018-06-12 DIAGNOSIS — I739 Peripheral vascular disease, unspecified: Secondary | ICD-10-CM | POA: Diagnosis not present

## 2018-06-12 DIAGNOSIS — Z0000 Encounter for general adult medical examination without abnormal findings: Secondary | ICD-10-CM | POA: Diagnosis not present

## 2018-06-12 DIAGNOSIS — Z720 Tobacco use: Secondary | ICD-10-CM | POA: Diagnosis not present

## 2018-06-12 DIAGNOSIS — Z889 Allergy status to unspecified drugs, medicaments and biological substances status: Secondary | ICD-10-CM | POA: Diagnosis not present

## 2018-06-12 DIAGNOSIS — I10 Essential (primary) hypertension: Secondary | ICD-10-CM | POA: Diagnosis not present

## 2018-08-07 DIAGNOSIS — Z20828 Contact with and (suspected) exposure to other viral communicable diseases: Secondary | ICD-10-CM | POA: Diagnosis not present

## 2018-09-17 ENCOUNTER — Ambulatory Visit (INDEPENDENT_AMBULATORY_CARE_PROVIDER_SITE_OTHER): Payer: Medicare HMO | Admitting: Nurse Practitioner

## 2018-09-17 ENCOUNTER — Encounter (INDEPENDENT_AMBULATORY_CARE_PROVIDER_SITE_OTHER): Payer: Medicare HMO

## 2018-10-07 DIAGNOSIS — Z720 Tobacco use: Secondary | ICD-10-CM | POA: Diagnosis not present

## 2018-10-07 DIAGNOSIS — Z0000 Encounter for general adult medical examination without abnormal findings: Secondary | ICD-10-CM | POA: Diagnosis not present

## 2018-10-07 DIAGNOSIS — Z889 Allergy status to unspecified drugs, medicaments and biological substances status: Secondary | ICD-10-CM | POA: Diagnosis not present

## 2018-10-07 DIAGNOSIS — I10 Essential (primary) hypertension: Secondary | ICD-10-CM | POA: Diagnosis not present

## 2018-10-07 DIAGNOSIS — I739 Peripheral vascular disease, unspecified: Secondary | ICD-10-CM | POA: Diagnosis not present

## 2018-10-07 DIAGNOSIS — Z853 Personal history of malignant neoplasm of breast: Secondary | ICD-10-CM | POA: Diagnosis not present

## 2018-10-14 DIAGNOSIS — F329 Major depressive disorder, single episode, unspecified: Secondary | ICD-10-CM | POA: Diagnosis not present

## 2018-10-14 DIAGNOSIS — F419 Anxiety disorder, unspecified: Secondary | ICD-10-CM | POA: Diagnosis not present

## 2018-10-14 DIAGNOSIS — R197 Diarrhea, unspecified: Secondary | ICD-10-CM | POA: Diagnosis not present

## 2018-10-14 DIAGNOSIS — Z853 Personal history of malignant neoplasm of breast: Secondary | ICD-10-CM | POA: Diagnosis not present

## 2018-10-14 DIAGNOSIS — I10 Essential (primary) hypertension: Secondary | ICD-10-CM | POA: Diagnosis not present

## 2018-10-14 DIAGNOSIS — Z23 Encounter for immunization: Secondary | ICD-10-CM | POA: Diagnosis not present

## 2018-10-14 DIAGNOSIS — I739 Peripheral vascular disease, unspecified: Secondary | ICD-10-CM | POA: Diagnosis not present

## 2018-10-14 DIAGNOSIS — F17210 Nicotine dependence, cigarettes, uncomplicated: Secondary | ICD-10-CM | POA: Diagnosis not present

## 2018-10-14 DIAGNOSIS — Z8619 Personal history of other infectious and parasitic diseases: Secondary | ICD-10-CM | POA: Diagnosis not present

## 2018-10-28 ENCOUNTER — Encounter (INDEPENDENT_AMBULATORY_CARE_PROVIDER_SITE_OTHER): Payer: Medicare HMO

## 2018-10-28 ENCOUNTER — Ambulatory Visit (INDEPENDENT_AMBULATORY_CARE_PROVIDER_SITE_OTHER): Payer: Medicare HMO | Admitting: Vascular Surgery

## 2018-11-18 ENCOUNTER — Encounter (INDEPENDENT_AMBULATORY_CARE_PROVIDER_SITE_OTHER): Payer: Medicare HMO

## 2018-11-18 ENCOUNTER — Ambulatory Visit (INDEPENDENT_AMBULATORY_CARE_PROVIDER_SITE_OTHER): Payer: Medicare HMO | Admitting: Vascular Surgery

## 2019-02-10 DIAGNOSIS — R197 Diarrhea, unspecified: Secondary | ICD-10-CM | POA: Diagnosis not present

## 2019-02-10 DIAGNOSIS — Z720 Tobacco use: Secondary | ICD-10-CM | POA: Diagnosis not present

## 2019-02-10 DIAGNOSIS — F3340 Major depressive disorder, recurrent, in remission, unspecified: Secondary | ICD-10-CM | POA: Diagnosis not present

## 2019-02-10 DIAGNOSIS — I739 Peripheral vascular disease, unspecified: Secondary | ICD-10-CM | POA: Diagnosis not present

## 2019-02-10 DIAGNOSIS — I10 Essential (primary) hypertension: Secondary | ICD-10-CM | POA: Diagnosis not present

## 2019-02-10 DIAGNOSIS — E7800 Pure hypercholesterolemia, unspecified: Secondary | ICD-10-CM | POA: Diagnosis not present

## 2019-03-02 DIAGNOSIS — Z853 Personal history of malignant neoplasm of breast: Secondary | ICD-10-CM | POA: Diagnosis not present

## 2019-03-02 DIAGNOSIS — Z0000 Encounter for general adult medical examination without abnormal findings: Secondary | ICD-10-CM | POA: Diagnosis not present

## 2019-03-02 DIAGNOSIS — J309 Allergic rhinitis, unspecified: Secondary | ICD-10-CM | POA: Diagnosis not present

## 2019-03-02 DIAGNOSIS — F419 Anxiety disorder, unspecified: Secondary | ICD-10-CM | POA: Diagnosis not present

## 2019-03-02 DIAGNOSIS — I10 Essential (primary) hypertension: Secondary | ICD-10-CM | POA: Diagnosis not present

## 2019-03-02 DIAGNOSIS — R3129 Other microscopic hematuria: Secondary | ICD-10-CM | POA: Diagnosis not present

## 2019-03-02 DIAGNOSIS — F329 Major depressive disorder, single episode, unspecified: Secondary | ICD-10-CM | POA: Diagnosis not present

## 2019-03-02 DIAGNOSIS — I739 Peripheral vascular disease, unspecified: Secondary | ICD-10-CM | POA: Diagnosis not present

## 2019-06-03 ENCOUNTER — Other Ambulatory Visit (INDEPENDENT_AMBULATORY_CARE_PROVIDER_SITE_OTHER): Payer: Self-pay | Admitting: Vascular Surgery

## 2019-06-03 NOTE — Telephone Encounter (Signed)
With ABIs

## 2019-06-03 NOTE — Telephone Encounter (Signed)
Patient was last seen 03/18/2018 and do not have upcoming appointment

## 2019-06-03 NOTE — Telephone Encounter (Signed)
Is it ok to refill this med the pt was last seen Feb of last year and the scrip was last filled  feb of this year.

## 2019-06-03 NOTE — Telephone Encounter (Signed)
She will need an appointment to be seen

## 2019-06-12 ENCOUNTER — Ambulatory Visit (INDEPENDENT_AMBULATORY_CARE_PROVIDER_SITE_OTHER): Payer: Medicare HMO | Admitting: Nurse Practitioner

## 2019-06-12 ENCOUNTER — Other Ambulatory Visit: Payer: Self-pay

## 2019-06-12 ENCOUNTER — Encounter (INDEPENDENT_AMBULATORY_CARE_PROVIDER_SITE_OTHER): Payer: Self-pay | Admitting: Nurse Practitioner

## 2019-06-12 ENCOUNTER — Ambulatory Visit (INDEPENDENT_AMBULATORY_CARE_PROVIDER_SITE_OTHER): Payer: Medicare HMO

## 2019-06-12 VITALS — BP 148/71 | HR 76 | Resp 16 | Wt 150.8 lb

## 2019-06-12 DIAGNOSIS — F17200 Nicotine dependence, unspecified, uncomplicated: Secondary | ICD-10-CM

## 2019-06-12 DIAGNOSIS — I70213 Atherosclerosis of native arteries of extremities with intermittent claudication, bilateral legs: Secondary | ICD-10-CM | POA: Diagnosis not present

## 2019-06-12 DIAGNOSIS — I10 Essential (primary) hypertension: Secondary | ICD-10-CM

## 2019-06-13 ENCOUNTER — Encounter (INDEPENDENT_AMBULATORY_CARE_PROVIDER_SITE_OTHER): Payer: Self-pay | Admitting: Nurse Practitioner

## 2019-06-13 MED ORDER — CLOPIDOGREL BISULFATE 75 MG PO TABS: 75.0000 mg | ORAL_TABLET | Freq: Every day | ORAL | 6 refills | Status: DC

## 2019-06-13 MED ORDER — ATORVASTATIN CALCIUM 10 MG PO TABS: 10.0000 mg | ORAL_TABLET | Freq: Every day | ORAL | 11 refills | Status: DC

## 2019-06-13 NOTE — Progress Notes (Signed)
Subjective:    Patient ID: Joan [name], female    DOB: November 01, 1950, 69 y.o.   MRN: [id number] Chief Complaint  Patient presents with  . Follow-up    ultrasound follow up    Patient presents today a little over a year after her most recent follow-up visit.  The patient states that due to coronavirus the patient was out of work for some time and she was unable to make several doctors appointments.  The patient endorses having some claudication-like symptoms at this time however they are not debilitating.  The patient also endorses some of the tingling sensations that occurred in her left lower extremity prior angiogram have returned.  She denies any fever, chills, nausea, vomiting or diarrhea.  She denies any lower wounds or ulcerations.  She denies any rest pain like symptoms.  Overall the patient states that she has been doing well however she recently ran out of Plavix and her statin.   Review of Systems  Cardiovascular:       Claudication  All other systems reviewed and are negative.      Objective:   Physical Exam Vitals reviewed.  HENT:     Head: Normocephalic.  Cardiovascular:     Rate and Rhythm: Normal rate and regular rhythm.     Pulses: Decreased pulses.     Heart sounds: Normal heart sounds.  Neurological:     Mental Status: She is alert and oriented to person, place, and time.  Psychiatric:        Mood and Affect: Mood normal.        Behavior: Behavior normal.        Thought Content: Thought content normal.        Judgment: Judgment normal.     BP (!) 148/71 (BP Location: Right Arm)   Pulse 76   Resp 16   Wt 150 lb 12.8 oz (68.4 kg)   BMI 25.88 kg/m   Past Medical History:  Diagnosis Date  . Arthritis   . Breast cancer, right breast [facility])    with radiation therapy  . Bronchitis   . Cancer ([place])   . Claudication, intermittent ([place])   . Depression   . Dyspnea   . Environmental and seasonal allergies   . GERD (gastroesophageal reflux disease)    with spicy food  . Hepatitis C antibody test positive   . Hypertension   . Peripheral vascular disease ([place])   . Personal history of radiation therapy 2002   RIGHT lumpectomy  . TIA (transient ischemic attack)     Social History   Socioeconomic History  . Marital status: Single    Spouse name: Not on file  . Number of children: Not on file  . Years of education: Not on file  . Highest education level: Not on file  Occupational History  . Not on file  Tobacco Use  . Smoking status: Current Every Day Smoker    Packs/day: 0.50    Years: 50.00    Pack years: 25.00  . Smokeless tobacco: Never Used  Substance and Sexual Activity  . Alcohol use: Not Currently    Comment: Beer heavy everyday  . Drug use: No  . Sexual activity: Not on file  Other Topics Concern  . Not on file  Social History Narrative  . Not on file   Social Determinants of Health   Financial Resource Strain:   . Difficulty of Paying Living Expenses:   Food Insecurity:   . Worried  About Running Out of Food in the Last Year:   . [place] in the Last Year:   Transportation Needs:   . Lack of Transportation (Medical):   Marland Kitchen Lack of Transportation (Non-Medical):   Physical Activity:   . Days of Exercise per Week:   . Minutes of Exercise per Session:   Stress:   . Feeling of Stress :   Social Connections:   . Frequency of Communication with Friends and Family:   . Frequency of Social Gatherings with Friends and Family:   . Attends Religious Services:   . Active Member of Clubs or Organizations:   . Attends Archivist Meetings:   Marland Kitchen Marital Status:   Intimate Partner Violence:   . Fear of Current or Ex-Partner:   . Emotionally Abused:   Marland Kitchen Physically Abused:   . Sexually Abused:     Past Surgical History:  Procedure Laterality Date  . APPENDECTOMY    . BREAST EXCISIONAL BIOPSY Right    11/1999  . BREAST LUMPECTOMY Right 2002   RIGHT lumpectomy w/ radiation 2002  . BREAST SURGERY      . CHOLECYSTECTOMY    . LOWER EXTREMITY ANGIOGRAPHY Left 11/14/2017   Procedure: LOWER EXTREMITY ANGIOGRAPHY;  Surgeon: Algernon Huxley, MD;  Location: [facility];  Service: Cardiovascular;  Laterality: Left;    Family History  Problem Relation Age of Onset  . Breast cancer Mother 71  . Breast cancer Maternal Aunt   . Breast cancer Maternal Grandmother   . Breast cancer Sister 63    No Known Allergies     Assessment & Plan:   1. Atherosclerosis of native artery of both lower extremities with intermittent claudication (HCC)   Recommend:  The patient has evidence of atherosclerosis of the lower extremities with claudication.  The patient does not voice lifestyle limiting changes at this point in time.  Noninvasive studies do not suggest clinically significant change.  No invasive studies, angiography or surgery at this time The patient should continue walking and begin a more formal exercise program.  The patient should continue antiplatelet therapy and aggressive treatment of the lipid abnormalities  No changes in the patient's medications at this time  The patient should continue wearing graduated compression socks 10-15 mmHg strength to control the mild edema.   We will maintain close follow-up.  The patient will contact her office for follow-up sooner if her leg begins to have severe pain or numbness.  If there is discoloration or feet become cold or discolored.  Otherwise we will have the patient return for noninvasive studies in 3 months. - clopidogrel (PLAVIX) 75 MG tablet; Take 1 tablet (75 mg total) by mouth daily.  Dispense: 30 tablet; Refill: 6 - atorvastatin (LIPITOR) 10 MG tablet; Take 1 tablet (10 mg total) by mouth daily.  Dispense: 30 tablet; Refill: 11  2. Essential hypertension Continue antihypertensive medications as already ordered, these medications have been reviewed and there are no changes at this time.   3. Tobacco use disorder Smoking  cessation was discussed, 3-10 minutes spent on this topic specifically    Current Outpatient Medications on File Prior to Visit  Medication Sig Dispense Refill  . ALPRAZolam (XANAX) 0.5 MG tablet   0  . aspirin EC 81 MG tablet Take by mouth.    Marland Kitchen lisinopril (PRINIVIL,ZESTRIL) 20 MG tablet Take 20 mg by mouth daily.    Marland Kitchen loratadine (CLARITIN) 10 MG tablet Take by mouth.    Marland Kitchen  sertraline (ZOLOFT) 100 MG tablet Take by mouth.     No current facility-administered medications on file prior to visit.    There are no Patient Instructions on file for this visit. No follow-ups on file.   Kris Hartmann, NP

## 2019-06-30 DIAGNOSIS — I739 Peripheral vascular disease, unspecified: Secondary | ICD-10-CM | POA: Diagnosis not present

## 2019-06-30 DIAGNOSIS — Z889 Allergy status to unspecified drugs, medicaments and biological substances status: Secondary | ICD-10-CM | POA: Diagnosis not present

## 2019-06-30 DIAGNOSIS — Z720 Tobacco use: Secondary | ICD-10-CM | POA: Diagnosis not present

## 2019-06-30 DIAGNOSIS — F3342 Major depressive disorder, recurrent, in full remission: Secondary | ICD-10-CM | POA: Diagnosis not present

## 2019-06-30 DIAGNOSIS — I10 Essential (primary) hypertension: Secondary | ICD-10-CM | POA: Diagnosis not present

## 2019-06-30 DIAGNOSIS — R3129 Other microscopic hematuria: Secondary | ICD-10-CM | POA: Diagnosis not present

## 2019-07-07 DIAGNOSIS — F419 Anxiety disorder, unspecified: Secondary | ICD-10-CM | POA: Diagnosis not present

## 2019-07-07 DIAGNOSIS — I10 Essential (primary) hypertension: Secondary | ICD-10-CM | POA: Diagnosis not present

## 2019-07-07 DIAGNOSIS — I739 Peripheral vascular disease, unspecified: Secondary | ICD-10-CM | POA: Diagnosis not present

## 2019-07-07 DIAGNOSIS — Z0000 Encounter for general adult medical examination without abnormal findings: Secondary | ICD-10-CM | POA: Diagnosis not present

## 2019-07-07 DIAGNOSIS — R3129 Other microscopic hematuria: Secondary | ICD-10-CM | POA: Diagnosis not present

## 2019-07-07 DIAGNOSIS — J309 Allergic rhinitis, unspecified: Secondary | ICD-10-CM | POA: Diagnosis not present

## 2019-07-07 DIAGNOSIS — Z853 Personal history of malignant neoplasm of breast: Secondary | ICD-10-CM | POA: Diagnosis not present

## 2019-07-07 DIAGNOSIS — Z8719 Personal history of other diseases of the digestive system: Secondary | ICD-10-CM | POA: Diagnosis not present

## 2019-07-07 DIAGNOSIS — F329 Major depressive disorder, single episode, unspecified: Secondary | ICD-10-CM | POA: Diagnosis not present

## 2019-07-08 ENCOUNTER — Other Ambulatory Visit: Payer: Self-pay | Admitting: Internal Medicine

## 2019-07-08 DIAGNOSIS — Z853 Personal history of malignant neoplasm of breast: Secondary | ICD-10-CM

## 2019-07-17 ENCOUNTER — Ambulatory Visit
Admission: RE | Admit: 2019-07-17 | Discharge: 2019-07-17 | Disposition: A | Discharge status: Home / Self Care | Payer: Medicare HMO | Source: Ambulatory Visit | Attending: Internal Medicine | Admitting: Internal Medicine

## 2019-07-17 DIAGNOSIS — Z853 Personal history of malignant neoplasm of breast: Secondary | ICD-10-CM

## 2019-07-17 DIAGNOSIS — N6321 Unspecified lump in the left breast, upper outer quadrant: Secondary | ICD-10-CM | POA: Diagnosis not present

## 2019-07-17 DIAGNOSIS — R928 Other abnormal and inconclusive findings on diagnostic imaging of breast: Secondary | ICD-10-CM | POA: Diagnosis not present

## 2019-07-17 DIAGNOSIS — N6323 Unspecified lump in the left breast, lower outer quadrant: Secondary | ICD-10-CM | POA: Diagnosis not present

## 2019-09-08 DIAGNOSIS — Z1212 Encounter for screening for malignant neoplasm of rectum: Secondary | ICD-10-CM | POA: Diagnosis not present

## 2019-09-08 DIAGNOSIS — Z1211 Encounter for screening for malignant neoplasm of colon: Secondary | ICD-10-CM | POA: Diagnosis not present

## 2019-09-11 ENCOUNTER — Ambulatory Visit (INDEPENDENT_AMBULATORY_CARE_PROVIDER_SITE_OTHER): Payer: Medicare HMO | Admitting: Vascular Surgery

## 2019-09-11 ENCOUNTER — Ambulatory Visit (INDEPENDENT_AMBULATORY_CARE_PROVIDER_SITE_OTHER): Payer: Medicare HMO

## 2019-09-11 ENCOUNTER — Encounter (INDEPENDENT_AMBULATORY_CARE_PROVIDER_SITE_OTHER): Payer: Self-pay | Admitting: Vascular Surgery

## 2019-09-11 ENCOUNTER — Other Ambulatory Visit: Payer: Self-pay

## 2019-09-11 VITALS — BP 155/81 | HR 69 | Resp 16 | Wt 146.0 lb

## 2019-09-11 DIAGNOSIS — F17200 Nicotine dependence, unspecified, uncomplicated: Secondary | ICD-10-CM

## 2019-09-11 DIAGNOSIS — I70213 Atherosclerosis of native arteries of extremities with intermittent claudication, bilateral legs: Secondary | ICD-10-CM

## 2019-09-11 DIAGNOSIS — I10 Essential (primary) hypertension: Secondary | ICD-10-CM | POA: Diagnosis not present

## 2019-09-11 DIAGNOSIS — F329 Major depressive disorder, single episode, unspecified: Secondary | ICD-10-CM | POA: Insufficient documentation

## 2019-09-11 NOTE — Assessment & Plan Note (Signed)

## 2019-09-11 NOTE — Assessment & Plan Note (Signed)
Noninvasive studies show stable right ABI of 0.64 and slight increase in the left ABI up to 0.68 (previously 0.56).  Duplex shows at least moderate stenosis in the left common femoral artery with monophasic flow throughout the left lower extremity. Claudication symptoms are not currently lifestyle limiting.  She does not want to undergo any intervention at this time which is certainly reasonable.  It would appear as if her left leg is going to require femoral endarterectomy in addition to any infrainguinal work.  She is going to continue to try to quit smoking.  Return in 6 months or sooner if symptoms progress.

## 2019-09-11 NOTE — Assessment & Plan Note (Signed)
blood pressure control important in reducing the progression of atherosclerotic disease. On appropriate oral medications.  

## 2019-09-11 NOTE — Progress Notes (Signed)
MRN : [id number]  Joan [name] is a 69 y.o. ([birth year]) female who presents with chief complaint of  Chief Complaint  Patient presents with   Follow-up    ultrassound follow up  .  History of Present Illness: Patient returns today in follow up of her PAD.  She is walking a little more and overall doing pretty well.  She does not have any new complaints today.  Noninvasive studies show stable right ABI of 0.64 and slight increase in the left ABI up to 0.68 (previously 0.56).  Duplex shows at least moderate stenosis in the left common femoral artery with monophasic flow throughout the left lower extremity. She is still smoking although she has cut way back.  Current Outpatient Medications  Medication Sig Dispense Refill   ALPRAZolam (XANAX) 0.5 MG tablet   0   aspirin EC 81 MG tablet Take by mouth.     atorvastatin (LIPITOR) 10 MG tablet Take 1 tablet (10 mg total) by mouth daily. 30 tablet 11   clopidogrel (PLAVIX) 75 MG tablet Take 1 tablet (75 mg total) by mouth daily. 30 tablet 6   lisinopril (PRINIVIL,ZESTRIL) 20 MG tablet Take 20 mg by mouth daily.     loratadine (CLARITIN) 10 MG tablet Take by mouth.     rosuvastatin (CRESTOR) 5 MG tablet      sertraline (ZOLOFT) 100 MG tablet Take by mouth.     No current facility-administered medications for this visit.    Past Medical History:  Diagnosis Date   Arthritis    Breast cancer, right breast ([place])    with radiation therapy   Bronchitis    Cancer ([place])    Claudication, intermittent (HCC)    Depression    Dyspnea    Environmental and seasonal allergies    GERD (gastroesophageal reflux disease)    with spicy food   Hepatitis C antibody test positive    Hypertension    Peripheral vascular disease ([place])    Personal history of radiation therapy 2002   RIGHT lumpectomy   TIA (transient ischemic attack)     Past Surgical History:  Procedure Laterality Date   APPENDECTOMY     BREAST EXCISIONAL  BIOPSY Right    11/1999   BREAST LUMPECTOMY Right 2002   RIGHT lumpectomy w/ radiation 2002   BREAST SURGERY     CHOLECYSTECTOMY     LOWER EXTREMITY ANGIOGRAPHY Left 11/14/2017   Procedure: LOWER EXTREMITY ANGIOGRAPHY;  Surgeon: Algernon Huxley, MD;  Location: [facility];  Service: Cardiovascular;  Laterality: Left;     Social History   Tobacco Use   Smoking status: Current Every Day Smoker    Packs/day: 0.50    Years: 50.00    Pack years: 25.00   Smokeless tobacco: Never Used  Vaping Use   Vaping Use: Never used  Substance Use Topics   Alcohol use: Not Currently    Comment: Beer heavy everyday   Drug use: No      Family History  Problem Relation Age of Onset   Breast cancer Mother 62   Breast cancer Maternal Aunt    Breast cancer Maternal Grandmother    Breast cancer Sister 30     No Known Allergies   REVIEW OF SYSTEMS(Negative unless checked)  Constitutional: [] ??Weight loss[] ??Fever[] ??Chills Cardiac:[] ??Chest pain[] ??Chest pressure[] ??Palpitations [] ??Shortness of breath when laying flat [] ??Shortness of breath at rest [] ??Shortness of breath with exertion. Vascular: [] ??Pain in legs with walking[] ??Pain in legsat rest[] ??Pain in legs when laying  flat [x] ??Claudication [] ??Pain in feet when walking [x] ??Pain in feet at rest [] ??Pain in feet when laying flat [] ??History of DVT [] ??Phlebitis [] ??Swelling in legs [] ??Varicose veins [] ??Non-healing ulcers Pulmonary: [] ??Uses home oxygen [] ??Productive cough[] ??Hemoptysis [] ??Wheeze [] ??COPD [] ??Asthma Neurologic: [] ??Dizziness [] ??Blackouts [] ??Seizures [] ??History of stroke [] ??History of TIA[] ??Aphasia [] ??Temporary blindness[] ??Dysphagia [] ??Weaknessor numbness in arms [] ??Weakness or numbnessin legs Musculoskeletal: [x] ??Arthritis [] ??Joint swelling [] ??Joint pain [] ??Low back pain Hematologic:[] ??Easy  bruising[] ??Easy bleeding [] ??Hypercoagulable state [] ??Anemic [] ??Hepatitis Gastrointestinal:[] ??Blood in stool[] ??Vomiting blood[] ??Gastroesophageal reflux/heartburn[] ??Abdominal pain Genitourinary: [] ??Chronic kidney disease [] ??Difficulturination [] ??Frequenturination [] ??Burning with urination[] ??Hematuria Skin: [] ??Rashes [] ??Ulcers [] ??Wounds Psychological: [] ??History of anxiety[] ??History of major depression.  Physical Examination  BP (!) 155/81 (BP Location: Right Arm)    Pulse 69    Resp 16    Wt 146 lb (66.2 kg)    BMI 25.06 kg/m  Gen:  WD/WN, NAD Head: [place]/AT, No temporalis wasting. Ear/Nose/Throat: Hearing grossly intact, nares w/o erythema or drainage Eyes: Conjunctiva clear. Sclera non-icteric Neck: Supple.  Trachea midline Pulmonary:  Good air movement, no use of accessory muscles.  Cardiac: RRR, no JVD Vascular:  Vessel Right Left  Radial Palpable Palpable                          PT 1+ Palpable 1+ Palpable  DP 1+ Palpable 1+ Palpable    Musculoskeletal: M/S 5/5 throughout.  No deformity or atrophy. No edema. Neurologic: Sensation grossly intact in extremities.  Symmetrical.  Speech is fluent.  Psychiatric: Judgment intact, Mood & affect appropriate for pt's clinical situation. Dermatologic: No rashes or ulcers noted.  No cellulitis or open wounds.       Labs No results found for this or any previous visit (from the past 2160 hour(s)).  Radiology No results found.  Assessment/Plan  Hypertension blood pressure control important in reducing the progression of atherosclerotic disease. On appropriate oral medications.   Tobacco use disorder We had a discussion for approximately 3 minutes regarding the absolute need for smoking cessation due to the deleterious nature of tobacco on the vascular system. We discussed the tobacco use would diminish patency of any intervention, and likely significantly worsen progressio  of disease. We discussed multiple agents for quitting including replacement therapy or medications to reduce cravings such as Chantix. The patient voices their understanding of the importance of smoking cessation.   Atherosclerosis of native arteries of extremity with intermittent claudication (HCC) Noninvasive studies show stable right ABI of 0.64 and slight increase in the left ABI up to 0.68 (previously 0.56).  Duplex shows at least moderate stenosis in the left common femoral artery with monophasic flow throughout the left lower extremity. Claudication symptoms are not currently lifestyle limiting.  She does not want to undergo any intervention at this time which is certainly reasonable.  It would appear as if her left leg is going to require femoral endarterectomy in addition to any infrainguinal work.  She is going to continue to try to quit smoking.  Return in 6 months or sooner if symptoms progress.    [name], MD  09/11/2019 12:02 PM    This note was created with Dragon medical transcription system.  Any errors from dictation are purely unintentional

## 2019-09-11 NOTE — Patient Instructions (Signed)
Peripheral Vascular Disease  Peripheral vascular disease (PVD) is a disease of the blood vessels that are not part of your heart and brain. A simple term for PVD is poor circulation. In most cases, PVD narrows the blood vessels that carry blood from your heart to the rest of your body. This can reduce the supply of blood to your arms, legs, and internal organs, like your stomach or kidneys. However, PVD most often affects a person's lower legs and feet. Without treatment, PVD tends to get worse. PVD can also lead to acute ischemic limb. This is when an arm or leg suddenly cannot get enough blood. This is a medical emergency. Follow these instructions at home: Lifestyle  Do not use any products that contain nicotine or tobacco, such as cigarettes and e-cigarettes. If you need help quitting, ask your doctor.  Lose weight if you are overweight. Or, stay at a healthy weight as told by your doctor.  Eat a diet that is low in fat and cholesterol. If you need help, ask your doctor.  Exercise regularly. Ask your doctor for activities that are right for you. General instructions  Take over-the-counter and prescription medicines only as told by your doctor.  Take good care of your feet: ? Wear comfortable shoes that fit well. ? Check your feet often for any cuts or sores.  Keep all follow-up visits as told by your doctor This is important. Contact a doctor if:  You have cramps in your legs when you walk.  You have leg pain when you are at rest.  You have coldness in a leg or foot.  Your skin changes.  You are unable to get or have an erection (erectile dysfunction).  You have cuts or sores on your feet that do not heal. Get help right away if:  Your arm or leg turns cold, numb, and blue.  Your arms or legs become red, warm, swollen, painful, or numb.  You have chest pain.  You have trouble breathing.  You suddenly have weakness in your face, arm, or leg.  You become very  confused or you cannot speak.  You suddenly have a very bad headache.  You suddenly cannot see. Summary  Peripheral vascular disease (PVD) is a disease of the blood vessels.  A simple term for PVD is poor circulation. Without treatment, PVD tends to get worse.  Treatment may include exercise, low fat and low cholesterol diet, and quitting smoking. This information is not intended to replace advice given to you by your health care provider. Make sure you discuss any questions you have with your health care provider. Document Revised: 12/21/2016 Document Reviewed: 02/16/2016 Elsevier Patient Education  2020 Elsevier Inc.  

## 2019-10-20 DIAGNOSIS — Z03818 Encounter for observation for suspected exposure to other biological agents ruled out: Secondary | ICD-10-CM | POA: Diagnosis not present

## 2019-10-20 DIAGNOSIS — Z1152 Encounter for screening for COVID-19: Secondary | ICD-10-CM | POA: Diagnosis not present

## 2019-12-30 DIAGNOSIS — Z0000 Encounter for general adult medical examination without abnormal findings: Secondary | ICD-10-CM | POA: Diagnosis not present

## 2019-12-30 DIAGNOSIS — I10 Essential (primary) hypertension: Secondary | ICD-10-CM | POA: Diagnosis not present

## 2019-12-30 DIAGNOSIS — F3340 Major depressive disorder, recurrent, in remission, unspecified: Secondary | ICD-10-CM | POA: Diagnosis not present

## 2019-12-30 DIAGNOSIS — I739 Peripheral vascular disease, unspecified: Secondary | ICD-10-CM | POA: Diagnosis not present

## 2020-02-05 DIAGNOSIS — R311 Benign essential microscopic hematuria: Secondary | ICD-10-CM | POA: Diagnosis not present

## 2020-02-05 DIAGNOSIS — I10 Essential (primary) hypertension: Secondary | ICD-10-CM | POA: Diagnosis not present

## 2020-02-05 DIAGNOSIS — Z889 Allergy status to unspecified drugs, medicaments and biological substances status: Secondary | ICD-10-CM | POA: Diagnosis not present

## 2020-02-05 DIAGNOSIS — J209 Acute bronchitis, unspecified: Secondary | ICD-10-CM | POA: Diagnosis not present

## 2020-02-05 DIAGNOSIS — F32A Depression, unspecified: Secondary | ICD-10-CM | POA: Diagnosis not present

## 2020-02-05 DIAGNOSIS — Z720 Tobacco use: Secondary | ICD-10-CM | POA: Diagnosis not present

## 2020-02-05 DIAGNOSIS — Z20822 Contact with and (suspected) exposure to covid-19: Secondary | ICD-10-CM | POA: Diagnosis not present

## 2020-02-05 DIAGNOSIS — I739 Peripheral vascular disease, unspecified: Secondary | ICD-10-CM | POA: Diagnosis not present

## 2020-02-05 DIAGNOSIS — Z853 Personal history of malignant neoplasm of breast: Secondary | ICD-10-CM | POA: Diagnosis not present

## 2020-03-15 ENCOUNTER — Ambulatory Visit (INDEPENDENT_AMBULATORY_CARE_PROVIDER_SITE_OTHER): Payer: Medicare HMO

## 2020-03-15 ENCOUNTER — Encounter (INDEPENDENT_AMBULATORY_CARE_PROVIDER_SITE_OTHER): Payer: Self-pay | Admitting: Nurse Practitioner

## 2020-03-15 ENCOUNTER — Ambulatory Visit (INDEPENDENT_AMBULATORY_CARE_PROVIDER_SITE_OTHER): Payer: Medicare HMO | Admitting: Nurse Practitioner

## 2020-03-15 ENCOUNTER — Other Ambulatory Visit: Payer: Self-pay

## 2020-03-15 VITALS — BP 128/83 | HR 86 | Resp 16 | Wt 143.0 lb

## 2020-03-15 DIAGNOSIS — I10 Essential (primary) hypertension: Secondary | ICD-10-CM | POA: Diagnosis not present

## 2020-03-15 DIAGNOSIS — F17200 Nicotine dependence, unspecified, uncomplicated: Secondary | ICD-10-CM

## 2020-03-15 DIAGNOSIS — I70213 Atherosclerosis of native arteries of extremities with intermittent claudication, bilateral legs: Secondary | ICD-10-CM

## 2020-03-15 NOTE — Progress Notes (Signed)
Subjective:    Patient ID: Joan [name], female    DOB: [birth year], 70 y.o.   MRN: [id number] Chief Complaint  Patient presents with  . Follow-up    6 month ABI    The patient returns to the office for followup and review of the noninvasive studies. There have been no interval changes in lower extremity symptoms. No interval shortening of the patient's claudication distance or development of rest pain symptoms. No new ulcers or wounds have occurred since the last visit.  There have been no significant changes to the patient's overall health care.  The patient denies amaurosis fugax or recent TIA symptoms. There are no recent neurological changes noted. The patient denies history of DVT, PE or superficial thrombophlebitis. The patient denies recent episodes of angina or shortness of breath.   ABI Rt=0.79 and Lt=0.81  (previous ABI's Rt=0.64 and Lt=0.68) Duplex ultrasound of the bilateral tibial arteries reveals biphasic/monophasic waveforms good toe waveforms   Review of Systems  All other systems reviewed and are negative.      Objective:   Physical Exam Vitals reviewed.  HENT:     Head: Normocephalic.  Cardiovascular:     Rate and Rhythm: Normal rate.     Pulses: Normal pulses.  Pulmonary:     Effort: Pulmonary effort is normal.  Skin:    General: Skin is warm and dry.  Neurological:     Mental Status: She is alert and oriented to person, place, and time.  Psychiatric:        Mood and Affect: Mood normal.        Behavior: Behavior normal.        Thought Content: Thought content normal.        Judgment: Judgment normal.     BP 128/83 (BP Location: Right Arm)   Pulse 86   Resp 16   Wt 143 lb (64.9 kg)   BMI 24.55 kg/m   Past Medical History:  Diagnosis Date  . Arthritis   . Breast cancer, right breast [facility])    with radiation therapy  . Bronchitis   . Cancer ([place])   . Claudication, intermittent ([place])   . Depression   . Dyspnea   . Environmental and  seasonal allergies   . GERD (gastroesophageal reflux disease)    with spicy food  . Hepatitis C antibody test positive   . Hypertension   . Peripheral vascular disease ([place])   . Personal history of radiation therapy 2002   RIGHT lumpectomy  . TIA (transient ischemic attack)     Social History   Socioeconomic History  . Marital status: Single    Spouse name: Not on file  . Number of children: Not on file  . Years of education: Not on file  . Highest education level: Not on file  Occupational History  . Not on file  Tobacco Use  . Smoking status: Current Every Day Smoker    Packs/day: 0.50    Years: 50.00    Pack years: 25.00  . Smokeless tobacco: Never Used  Vaping Use  . Vaping Use: Never used  Substance and Sexual Activity  . Alcohol use: Not Currently    Comment: Beer heavy everyday  . Drug use: No  . Sexual activity: Not on file  Other Topics Concern  . Not on file  Social History Narrative  . Not on file   Social Determinants of Health   Financial Resource Strain: Not on file  Food Insecurity: Not  on file  Transportation Needs: Not on file  Physical Activity: Not on file  Stress: Not on file  Social Connections: Not on file  Intimate Partner Violence: Not on file    Past Surgical History:  Procedure Laterality Date  . APPENDECTOMY    . BREAST EXCISIONAL BIOPSY Right    11/1999  . BREAST LUMPECTOMY Right 2002   RIGHT lumpectomy w/ radiation 2002  . BREAST SURGERY    . CHOLECYSTECTOMY    . LOWER EXTREMITY ANGIOGRAPHY Left 11/14/2017   Procedure: LOWER EXTREMITY ANGIOGRAPHY;  Surgeon: Algernon Huxley, MD;  Location: [facility];  Service: Cardiovascular;  Laterality: Left;    Family History  Problem Relation Age of Onset  . Breast cancer Mother 32  . Breast cancer Maternal Aunt   . Breast cancer Maternal Grandmother   . Breast cancer Sister 5    No Known Allergies  CBC Latest Ref Rng & Units 07/16/2013 07/15/2013 07/14/2013  WBC 4.0 -  10.5 K/uL 6.5 9.3 7.9  Hemoglobin 12.0 - 15.0 g/dL 12.4 13.1 14.0  Hematocrit 36.0 - 46.0 % 34.0(L) 34.6(L) 37.9  Platelets 150 - 400 K/uL 223 244 253      CMP     Component Value Date/Time   NA 132 (L) 07/18/2013 0447   K 4.3 07/18/2013 0447   CL 97 07/18/2013 0447   CO2 22 07/18/2013 0447   GLUCOSE 85 07/18/2013 0447   BUN 7 (L) 11/12/2017 0948   CREATININE 0.60 11/12/2017 0948   CALCIUM 9.2 07/18/2013 0447   PROT 7.7 07/14/2013 1803   ALBUMIN 4.1 07/14/2013 1803   AST 45 (H) 07/14/2013 1803   ALT 31 07/14/2013 1803   ALKPHOS 111 07/14/2013 1803   BILITOT 1.0 07/14/2013 1803   GFRNONAA >60 11/12/2017 0948   GFRAA >60 11/12/2017 0948     No results found.     Assessment & Plan:   1. Atherosclerosis of native artery of both lower extremities with intermittent claudication (HCC)  Recommend:  The patient has evidence of atherosclerosis of the lower extremities with claudication.  The patient does not voice lifestyle limiting changes at this point in time.  Noninvasive studies do not suggest clinically significant change.  No invasive studies, angiography or surgery at this time The patient should continue walking and begin a more formal exercise program.  The patient should continue antiplatelet therapy and aggressive treatment of the lipid abnormalities  No changes in the patient's medications at this time  The patient should continue wearing graduated compression socks 10-15 mmHg strength to control the mild edema.   Patient will return in 6 months with non invasive studies 2. Essential hypertension Continue antihypertensive medications as already ordered, these medications have been reviewed and there are no changes at this time.   3. Tobacco use disorder Smoking cessation was discussed, 3-10 minutes spent on this topic specifically    Current Outpatient Medications on File Prior to Visit  Medication Sig Dispense Refill  . ALPRAZolam (XANAX) 0.5 MG  tablet   0  . aspirin EC 81 MG tablet Take by mouth.    Marland Kitchen atorvastatin (LIPITOR) 10 MG tablet Take 1 tablet (10 mg total) by mouth daily. 30 tablet 11  . clopidogrel (PLAVIX) 75 MG tablet Take 1 tablet (75 mg total) by mouth daily. 30 tablet 6  . lisinopril (PRINIVIL,ZESTRIL) 20 MG tablet Take 20 mg by mouth daily.    Marland Kitchen loratadine (CLARITIN) 10 MG tablet Take by mouth.    Marland Kitchen  rosuvastatin (CRESTOR) 5 MG tablet     . sertraline (ZOLOFT) 100 MG tablet Take by mouth.     No current facility-administered medications on file prior to visit.    There are no Patient Instructions on file for this visit. No follow-ups on file.   Kris Hartmann, NP

## 2020-04-05 ENCOUNTER — Telehealth (INDEPENDENT_AMBULATORY_CARE_PROVIDER_SITE_OTHER): Payer: Self-pay | Admitting: Vascular Surgery

## 2020-04-05 NOTE — Telephone Encounter (Signed)
She can come in with ABIs.  The sensation she is describing isn't typical of PAD.  If her studies don't show a difference she'll need to see her PCP for further work up

## 2020-04-05 NOTE — Telephone Encounter (Signed)
scheduled patient and made her aware of Np's note.

## 2020-04-05 NOTE — Telephone Encounter (Signed)
Called stating that left leg has been hurting since Sunday. Patient states that there is a throbbing sensation going from her legs to her knee. Patient would like to come in to be seen. Patient was last seen 03-15-20 with abi's (fb). Please advise.

## 2020-04-06 ENCOUNTER — Other Ambulatory Visit (INDEPENDENT_AMBULATORY_CARE_PROVIDER_SITE_OTHER): Payer: Self-pay | Admitting: Nurse Practitioner

## 2020-04-06 DIAGNOSIS — M79605 Pain in left leg: Secondary | ICD-10-CM

## 2020-04-08 ENCOUNTER — Ambulatory Visit (INDEPENDENT_AMBULATORY_CARE_PROVIDER_SITE_OTHER): Payer: Medicare HMO

## 2020-04-08 ENCOUNTER — Encounter (INDEPENDENT_AMBULATORY_CARE_PROVIDER_SITE_OTHER): Payer: Self-pay | Admitting: Nurse Practitioner

## 2020-04-08 ENCOUNTER — Other Ambulatory Visit: Payer: Self-pay

## 2020-04-08 ENCOUNTER — Ambulatory Visit (INDEPENDENT_AMBULATORY_CARE_PROVIDER_SITE_OTHER): Payer: Medicare HMO | Admitting: Nurse Practitioner

## 2020-04-08 ENCOUNTER — Other Ambulatory Visit (INDEPENDENT_AMBULATORY_CARE_PROVIDER_SITE_OTHER): Payer: Self-pay | Admitting: Nurse Practitioner

## 2020-04-08 VITALS — BP 177/79 | HR 69 | Resp 16 | Wt 145.0 lb

## 2020-04-08 DIAGNOSIS — M79605 Pain in left leg: Secondary | ICD-10-CM

## 2020-04-08 DIAGNOSIS — Z720 Tobacco use: Secondary | ICD-10-CM

## 2020-04-08 DIAGNOSIS — I739 Peripheral vascular disease, unspecified: Secondary | ICD-10-CM

## 2020-04-08 DIAGNOSIS — I10 Essential (primary) hypertension: Secondary | ICD-10-CM | POA: Diagnosis not present

## 2020-04-08 NOTE — H&P (View-Only) (Signed)
Subjective:    Patient ID: Joan [name], female    DOB: [birth year], 70 y.o.   MRN: [id number] Chief Complaint  Patient presents with  . Follow-up    Add on per phone note    Joan [name] is a 70 year old female that presents roughly 1 month after her most recent visit.  The patient notes that she began to have an aching throbbing pain from her hip to her knee suddenly 1 day and was concerned about her circulation.  Today, the patient notes that the pain has completely resolved and she suspects it may be related to sleeping in a strange position.  The patient does endorse having claudication-like symptoms.  She notes that this is not interfering with her daily activities.  She denies any rest pain like symptoms.  She denies any lower wounds or ulcerations.  She denies any discoloration to her lower extremities.  She denies any numbness or tingling.  Overall she feels well.  Patient has previously underwent.  Patient is on the left lower extremity.  The most recent was on 11/14/2017 with left common iliac and SFA stents with left common femoral artery and bilateral external iliac angioplasties.  Today noninvasive studies have changed since her previous study on 03/15/2020.  Previous study showed an ABI of 0.79 on the right and 0.81 on the left.  Today the patient has an ABI 0.76 on the right with an ABI of 0.72 on the left.  The patient has monophasic waveforms throughout the left lower extremity.  A limited duplex demonstrates significant left iliac level arterial disease.  There is a greater than 50% stenosis in the left distal common femoral artery.  The right lower extremity limited duplex demonstrates evidence of an SFA occlusion.   Review of Systems  Cardiovascular:       Claudication  Musculoskeletal: Positive for arthralgias and gait problem.  All other systems reviewed and are negative.      Objective:   Physical Exam Vitals reviewed.  HENT:     Head: Normocephalic.   Cardiovascular:     Rate and Rhythm: Normal rate.     Pulses: Normal pulses.  Pulmonary:     Effort: Pulmonary effort is normal.  Skin:    General: Skin is warm and dry.  Neurological:     Mental Status: She is alert and oriented to person, place, and time.  Psychiatric:        Mood and Affect: Mood normal.        Behavior: Behavior normal.        Thought Content: Thought content normal.        Judgment: Judgment normal.     BP (!) 177/79 (BP Location: Right Arm)   Pulse 69   Resp 16   Wt 145 lb (65.8 kg)   BMI 24.89 kg/m   Past Medical History:  Diagnosis Date  . Arthritis   . Breast cancer, right breast [facility])    with radiation therapy  . Bronchitis   . Cancer ([place])   . Claudication, intermittent ([place])   . Depression   . Dyspnea   . Environmental and seasonal allergies   . GERD (gastroesophageal reflux disease)    with spicy food  . Hepatitis C antibody test positive   . Hypertension   . Peripheral vascular disease ([place])   . Personal history of radiation therapy 2002   RIGHT lumpectomy  . TIA (transient ischemic attack)     Social History   Socioeconomic  History  . Marital status: Single    Spouse name: Not on file  . Number of children: Not on file  . Years of education: Not on file  . Highest education level: Not on file  Occupational History  . Not on file  Tobacco Use  . Smoking status: Current Every Day Smoker    Packs/day: 0.50    Years: 50.00    Pack years: 25.00  . Smokeless tobacco: Never Used  Vaping Use  . Vaping Use: Never used  Substance and Sexual Activity  . Alcohol use: Not Currently    Comment: Beer heavy everyday  . Drug use: No  . Sexual activity: Not on file  Other Topics Concern  . Not on file  Social History Narrative  . Not on file   Social Determinants of Health   Financial Resource Strain: Not on file  Food Insecurity: Not on file  Transportation Needs: Not on file  Physical Activity: Not on file  Stress: Not  on file  Social Connections: Not on file  Intimate Partner Violence: Not on file    Past Surgical History:  Procedure Laterality Date  . APPENDECTOMY    . BREAST EXCISIONAL BIOPSY Right    11/1999  . BREAST LUMPECTOMY Right 2002   RIGHT lumpectomy w/ radiation 2002  . BREAST SURGERY    . CHOLECYSTECTOMY    . LOWER EXTREMITY ANGIOGRAPHY Left 11/14/2017   Procedure: LOWER EXTREMITY ANGIOGRAPHY;  Surgeon: Algernon Huxley, MD;  Location: [facility];  Service: Cardiovascular;  Laterality: Left;    Family History  Problem Relation Age of Onset  . Breast cancer Mother 54  . Breast cancer Maternal Aunt   . Breast cancer Maternal Grandmother   . Breast cancer Sister 7    No Known Allergies  CBC Latest Ref Rng & Units 07/16/2013 07/15/2013 07/14/2013  WBC 4.0 - 10.5 K/uL 6.5 9.3 7.9  Hemoglobin 12.0 - 15.0 g/dL 12.4 13.1 14.0  Hematocrit 36.0 - 46.0 % 34.0(L) 34.6(L) 37.9  Platelets 150 - 400 K/uL 223 244 253      CMP     Component Value Date/Time   NA 132 (L) 07/18/2013 0447   K 4.3 07/18/2013 0447   CL 97 07/18/2013 0447   CO2 22 07/18/2013 0447   GLUCOSE 85 07/18/2013 0447   BUN 7 (L) 11/12/2017 0948   CREATININE 0.60 11/12/2017 0948   CALCIUM 9.2 07/18/2013 0447   PROT 7.7 07/14/2013 1803   ALBUMIN 4.1 07/14/2013 1803   AST 45 (H) 07/14/2013 1803   ALT 31 07/14/2013 1803   ALKPHOS 111 07/14/2013 1803   BILITOT 1.0 07/14/2013 1803   GFRNONAA >60 11/12/2017 0948   GFRAA >60 11/12/2017 0948     VAS [place] ABI WITH/WO TBI  Result Date: 03/18/2020 LOWER EXTREMITY DOPPLER STUDY Indications: Peripheral artery disease.  Vascular Interventions: Surgery 11/14/2017, Aortagram and Angiogram                         bilaterally. Comparison Study: 08/2019 Performing Technologist: Concha Norway RVT  Examination Guidelines: A complete evaluation includes at minimum, Doppler waveform signals and systolic blood pressure reading at the level of bilateral brachial, anterior tibial,  and posterior tibial arteries, when vessel segments are accessible. Bilateral testing is considered an integral part of a complete examination. Photoelectric Plethysmograph (PPG) waveforms and toe systolic pressure readings are included as required and additional duplex testing as needed. Limited examinations for reoccurring indications  may be performed as noted.  ABI Findings: +---------+------------------+-----+----------+--------+ Right    Rt Pressure (mmHg)IndexWaveform  Comment  +---------+------------------+-----+----------+--------+ Brachial 135                                       +---------+------------------+-----+----------+--------+ ATA      99                0.73 biphasic           +---------+------------------+-----+----------+--------+ PTA      106               0.79 monophasic         +---------+------------------+-----+----------+--------+ Great Toe70                0.52 Abnormal           +---------+------------------+-----+----------+--------+ +---------+------------------+-----+----------+-------+ Left     Lt Pressure (mmHg)IndexWaveform  Comment +---------+------------------+-----+----------+-------+ ATA      110               0.81 biphasic          +---------+------------------+-----+----------+-------+ PTA      103               0.76 monophasic        +---------+------------------+-----+----------+-------+ Great Toe73                0.54 Abnormal          +---------+------------------+-----+----------+-------+ +-------+-----------+-----------+------------+------------+ ABI/TBIToday's ABIToday's TBIPrevious ABIPrevious TBI +-------+-----------+-----------+------------+------------+ Right  .79        .52        .64         .50          +-------+-----------+-----------+------------+------------+ Left   .81        .54        .68         .63          +-------+-----------+-----------+------------+------------+ Compared to prior  study on 08/2019.  Summary: Right: Resting right ankle-brachial index indicates moderate right lower extremity arterial disease. The right toe-brachial index is abnormal. Left: Resting left ankle-brachial index indicates mild left lower extremity arterial disease. The left toe-brachial index is normal.  *See table(s) above for measurements and observations.  Electronically signed by [name] MD on 03/18/2020 at 11:59:13 AM.    Final        Assessment & Plan:   1. PAD (peripheral artery disease) ([place]) I had a long discussion with the patient regarding her noninvasive studies today.  While the pain that initially prompted the office visit today has resolved, the studies have revealed evidence of significant disease in both lower extremities.  The stents placed in the right SFA have occluded and there is a greater than 50% stenosis in the left distal common femoral artery.  Currently the patient's symptoms are not lifestyle limiting or limb threatening.  However, it was discussed that this could become a limb threatening situation in a relatively short and second amount of time.  Angiogram was recommended today however the patient wishes to wait at this time.  Patient is advised to contact our office if she notices that her claudication distance shortens, she develops rest pain or wounds or ulcerations of her lower extremities.  We also discussed that if the patient has a change in symptoms soon we may proceed with angiogram because we note that she is currently at  a point where intervention is warranted.  The patient is in agreement with this plan.    The patient contacted our office shortly after her office visit to note that she discussed it with her family and she would actually like to proceed with her procedure.  I discussed that based on the studies each leg would need intervention.  I discussed the risk, benefits and alternatives of the procedure and the patient wishes to proceed.  The patient will  follow up in office following her procedures.  2. Tobacco user Smoking cessation was discussed, 3-10 minutes spent on this topic specifically   3. Benign essential hypertension Continue antihypertensive medications as already ordered, these medications have been reviewed and there are no changes at this time.    Current Outpatient Medications on File Prior to Visit  Medication Sig Dispense Refill  . ALPRAZolam (XANAX) 0.5 MG tablet   0  . aspirin EC 81 MG tablet Take by mouth.    Marland Kitchen atorvastatin (LIPITOR) 10 MG tablet Take 1 tablet (10 mg total) by mouth daily. 30 tablet 11  . clopidogrel (PLAVIX) 75 MG tablet Take 1 tablet (75 mg total) by mouth daily. 30 tablet 6  . lisinopril (PRINIVIL,ZESTRIL) 20 MG tablet Take 20 mg by mouth daily.    Marland Kitchen loratadine (CLARITIN) 10 MG tablet Take by mouth.    . rosuvastatin (CRESTOR) 5 MG tablet     . sertraline (ZOLOFT) 100 MG tablet Take by mouth.     No current facility-administered medications on file prior to visit.    There are no Patient Instructions on file for this visit. No follow-ups on file.   Kris Hartmann, NP

## 2020-04-08 NOTE — Progress Notes (Addendum)
Subjective:    Patient ID: Joan [name], female    DOB: February 11, 1950, 70 y.o.   MRN: [id number] Chief Complaint  Patient presents with  . Follow-up    Add on per phone note    Joan [name] is a 70 year old female that presents roughly 1 month after her most recent visit.  The patient notes that she began to have an aching throbbing pain from her hip to her knee suddenly 1 day and was concerned about her circulation.  Today, the patient notes that the pain has completely resolved and she suspects it may be related to sleeping in a strange position.  The patient does endorse having claudication-like symptoms.  She notes that this is not interfering with her daily activities.  She denies any rest pain like symptoms.  She denies any lower wounds or ulcerations.  She denies any discoloration to her lower extremities.  She denies any numbness or tingling.  Overall she feels well.  Patient has previously underwent.  Patient is on the left lower extremity.  The most recent was on 11/14/2017 with left common iliac and SFA stents with left common femoral artery and bilateral external iliac angioplasties.  Today noninvasive studies have changed since her previous study on 03/15/2020.  Previous study showed an ABI of 0.79 on the right and 0.81 on the left.  Today the patient has an ABI 0.76 on the right with an ABI of 0.72 on the left.  The patient has monophasic waveforms throughout the left lower extremity.  A limited duplex demonstrates significant left iliac level arterial disease.  There is a greater than 50% stenosis in the left distal common femoral artery.  The right lower extremity limited duplex demonstrates evidence of an SFA occlusion.   Review of Systems  Cardiovascular:       Claudication  Musculoskeletal: Positive for arthralgias and gait problem.  All other systems reviewed and are negative.      Objective:   Physical Exam Vitals reviewed.  HENT:     Head: Normocephalic.   Cardiovascular:     Rate and Rhythm: Normal rate.     Pulses: Normal pulses.  Pulmonary:     Effort: Pulmonary effort is normal.  Skin:    General: Skin is warm and dry.  Neurological:     Mental Status: She is alert and oriented to person, place, and time.  Psychiatric:        Mood and Affect: Mood normal.        Behavior: Behavior normal.        Thought Content: Thought content normal.        Judgment: Judgment normal.     BP (!) 177/79 (BP Location: Right Arm)   Pulse 69   Resp 16   Wt 145 lb (65.8 kg)   BMI 24.89 kg/m   Past Medical History:  Diagnosis Date  . Arthritis   . Breast cancer, right breast [facility])    with radiation therapy  . Bronchitis   . Cancer ([place])   . Claudication, intermittent ([place])   . Depression   . Dyspnea   . Environmental and seasonal allergies   . GERD (gastroesophageal reflux disease)    with spicy food  . Hepatitis C antibody test positive   . Hypertension   . Peripheral vascular disease ([place])   . Personal history of radiation therapy 2002   RIGHT lumpectomy  . TIA (transient ischemic attack)     Social History   Socioeconomic  History  . Marital status: Single    Spouse name: Not on file  . Number of children: Not on file  . Years of education: Not on file  . Highest education level: Not on file  Occupational History  . Not on file  Tobacco Use  . Smoking status: Current Every Day Smoker    Packs/day: 0.50    Years: 50.00    Pack years: 25.00  . Smokeless tobacco: Never Used  Vaping Use  . Vaping Use: Never used  Substance and Sexual Activity  . Alcohol use: Not Currently    Comment: Beer heavy everyday  . Drug use: No  . Sexual activity: Not on file  Other Topics Concern  . Not on file  Social History Narrative  . Not on file   Social Determinants of Health   Financial Resource Strain: Not on file  Food Insecurity: Not on file  Transportation Needs: Not on file  Physical Activity: Not on file  Stress: Not  on file  Social Connections: Not on file  Intimate Partner Violence: Not on file    Past Surgical History:  Procedure Laterality Date  . APPENDECTOMY    . BREAST EXCISIONAL BIOPSY Right    11/1999  . BREAST LUMPECTOMY Right 2002   RIGHT lumpectomy w/ radiation 2002  . BREAST SURGERY    . CHOLECYSTECTOMY    . LOWER EXTREMITY ANGIOGRAPHY Left 11/14/2017   Procedure: LOWER EXTREMITY ANGIOGRAPHY;  Surgeon: Algernon Huxley, MD;  Location: [facility];  Service: Cardiovascular;  Laterality: Left;    Family History  Problem Relation Age of Onset  . Breast cancer Mother 26  . Breast cancer Maternal Aunt   . Breast cancer Maternal Grandmother   . Breast cancer Sister 96    No Known Allergies  CBC Latest Ref Rng & Units 07/16/2013 07/15/2013 07/14/2013  WBC 4.0 - 10.5 K/uL 6.5 9.3 7.9  Hemoglobin 12.0 - 15.0 g/dL 12.4 13.1 14.0  Hematocrit 36.0 - 46.0 % 34.0(L) 34.6(L) 37.9  Platelets 150 - 400 K/uL 223 244 253      CMP     Component Value Date/Time   NA 132 (L) 07/18/2013 0447   K 4.3 07/18/2013 0447   CL 97 07/18/2013 0447   CO2 22 07/18/2013 0447   GLUCOSE 85 07/18/2013 0447   BUN 7 (L) 11/12/2017 0948   CREATININE 0.60 11/12/2017 0948   CALCIUM 9.2 07/18/2013 0447   PROT 7.7 07/14/2013 1803   ALBUMIN 4.1 07/14/2013 1803   AST 45 (H) 07/14/2013 1803   ALT 31 07/14/2013 1803   ALKPHOS 111 07/14/2013 1803   BILITOT 1.0 07/14/2013 1803   GFRNONAA >60 11/12/2017 0948   GFRAA >60 11/12/2017 0948     VAS [place] ABI WITH/WO TBI  Result Date: 03/18/2020 LOWER EXTREMITY DOPPLER STUDY Indications: Peripheral artery disease.  Vascular Interventions: Surgery 11/14/2017, Aortagram and Angiogram                         bilaterally. Comparison Study: 08/2019 Performing Technologist: Concha Norway RVT  Examination Guidelines: A complete evaluation includes at minimum, Doppler waveform signals and systolic blood pressure reading at the level of bilateral brachial, anterior tibial,  and posterior tibial arteries, when vessel segments are accessible. Bilateral testing is considered an integral part of a complete examination. Photoelectric Plethysmograph (PPG) waveforms and toe systolic pressure readings are included as required and additional duplex testing as needed. Limited examinations for reoccurring indications  may be performed as noted.  ABI Findings: +---------+------------------+-----+----------+--------+ Right    Rt Pressure (mmHg)IndexWaveform  Comment  +---------+------------------+-----+----------+--------+ Brachial 135                                       +---------+------------------+-----+----------+--------+ ATA      99                0.73 biphasic           +---------+------------------+-----+----------+--------+ PTA      106               0.79 monophasic         +---------+------------------+-----+----------+--------+ Great Toe70                0.52 Abnormal           +---------+------------------+-----+----------+--------+ +---------+------------------+-----+----------+-------+ Left     Lt Pressure (mmHg)IndexWaveform  Comment +---------+------------------+-----+----------+-------+ ATA      110               0.81 biphasic          +---------+------------------+-----+----------+-------+ PTA      103               0.76 monophasic        +---------+------------------+-----+----------+-------+ Great Toe73                0.54 Abnormal          +---------+------------------+-----+----------+-------+ +-------+-----------+-----------+------------+------------+ ABI/TBIToday's ABIToday's TBIPrevious ABIPrevious TBI +-------+-----------+-----------+------------+------------+ Right  .79        .52        .64         .50          +-------+-----------+-----------+------------+------------+ Left   .81        .54        .68         .63          +-------+-----------+-----------+------------+------------+ Compared to prior  study on 08/2019.  Summary: Right: Resting right ankle-brachial index indicates moderate right lower extremity arterial disease. The right toe-brachial index is abnormal. Left: Resting left ankle-brachial index indicates mild left lower extremity arterial disease. The left toe-brachial index is normal.  *See table(s) above for measurements and observations.  Electronically signed by [name] MD on 03/18/2020 at 11:59:13 AM.    Final        Assessment & Plan:   1. PAD (peripheral artery disease) ([place]) I had a long discussion with the patient regarding her noninvasive studies today.  While the pain that initially prompted the office visit today has resolved, the studies have revealed evidence of significant disease in both lower extremities.  The stents placed in the right SFA have occluded and there is a greater than 50% stenosis in the left distal common femoral artery.  Currently the patient's symptoms are not lifestyle limiting or limb threatening.  However, it was discussed that this could become a limb threatening situation in a relatively short and second amount of time.  Angiogram was recommended today however the patient wishes to wait at this time.  Patient is advised to contact our office if she notices that her claudication distance shortens, she develops rest pain or wounds or ulcerations of her lower extremities.  We also discussed that if the patient has a change in symptoms soon we may proceed with angiogram because we note that she is currently at  a point where intervention is warranted.  The patient is in agreement with this plan.    The patient contacted our office shortly after her office visit to note that she discussed it with her family and she would actually like to proceed with her procedure.  I discussed that based on the studies each leg would need intervention.  I discussed the risk, benefits and alternatives of the procedure and the patient wishes to proceed.  The patient will  follow up in office following her procedures.  2. Tobacco user Smoking cessation was discussed, 3-10 minutes spent on this topic specifically   3. Benign essential hypertension Continue antihypertensive medications as already ordered, these medications have been reviewed and there are no changes at this time.    Current Outpatient Medications on File Prior to Visit  Medication Sig Dispense Refill  . ALPRAZolam (XANAX) 0.5 MG tablet   0  . aspirin EC 81 MG tablet Take by mouth.    Marland Kitchen atorvastatin (LIPITOR) 10 MG tablet Take 1 tablet (10 mg total) by mouth daily. 30 tablet 11  . clopidogrel (PLAVIX) 75 MG tablet Take 1 tablet (75 mg total) by mouth daily. 30 tablet 6  . lisinopril (PRINIVIL,ZESTRIL) 20 MG tablet Take 20 mg by mouth daily.    Marland Kitchen loratadine (CLARITIN) 10 MG tablet Take by mouth.    . rosuvastatin (CRESTOR) 5 MG tablet     . sertraline (ZOLOFT) 100 MG tablet Take by mouth.     No current facility-administered medications on file prior to visit.    There are no Patient Instructions on file for this visit. No follow-ups on file.   Kris Hartmann, NP

## 2020-04-13 ENCOUNTER — Telehealth (INDEPENDENT_AMBULATORY_CARE_PROVIDER_SITE_OTHER): Payer: Self-pay

## 2020-04-13 NOTE — Telephone Encounter (Signed)
Spoke with the patient and she is scheduled with Dr. Lucky Cowboy for a RLE angio ( 04/21/20 - 6:45 am- [facility] , covid-04/19/20 - 8-2 MAB)  and LLE angio (04/28/20 - 7:45 am- [facility], covid-04/26/20 - 8-2 MAB). Pre-procedure instructions were discussed and will be mailed.

## 2020-04-19 ENCOUNTER — Other Ambulatory Visit: Payer: Self-pay

## 2020-04-19 ENCOUNTER — Other Ambulatory Visit
Admission: RE | Admit: 2020-04-19 | Discharge: 2020-04-19 | Disposition: A | Discharge status: Home / Self Care | Payer: Medicare HMO | Source: Ambulatory Visit | Attending: Vascular Surgery | Admitting: Vascular Surgery

## 2020-04-19 DIAGNOSIS — Z01812 Encounter for preprocedural laboratory examination: Secondary | ICD-10-CM | POA: Diagnosis not present

## 2020-04-19 DIAGNOSIS — Z20822 Contact with and (suspected) exposure to covid-19: Secondary | ICD-10-CM | POA: Insufficient documentation

## 2020-04-19 LAB — SARS CORONAVIRUS 2 (TAT 6-24 HRS): SARS Coronavirus 2: NEGATIVE

## 2020-04-20 ENCOUNTER — Other Ambulatory Visit (INDEPENDENT_AMBULATORY_CARE_PROVIDER_SITE_OTHER): Payer: Self-pay | Admitting: Nurse Practitioner

## 2020-04-21 ENCOUNTER — Encounter
Admission: RE | Disposition: A | Discharge status: Home / Self Care | Payer: Self-pay | Source: Home / Self Care | Attending: Vascular Surgery

## 2020-04-21 ENCOUNTER — Other Ambulatory Visit: Payer: Self-pay

## 2020-04-21 ENCOUNTER — Encounter: Payer: Self-pay | Admitting: Vascular Surgery

## 2020-04-21 ENCOUNTER — Ambulatory Visit
Admission: RE | Admit: 2020-04-21 | Discharge: 2020-04-21 | Disposition: A | Discharge status: Home / Self Care | Payer: Medicare HMO | Attending: Vascular Surgery | Admitting: Vascular Surgery

## 2020-04-21 DIAGNOSIS — Z79899 Other long term (current) drug therapy: Secondary | ICD-10-CM | POA: Insufficient documentation

## 2020-04-21 DIAGNOSIS — Z7902 Long term (current) use of antithrombotics/antiplatelets: Secondary | ICD-10-CM | POA: Diagnosis not present

## 2020-04-21 DIAGNOSIS — I70223 Atherosclerosis of native arteries of extremities with rest pain, bilateral legs: Secondary | ICD-10-CM

## 2020-04-21 DIAGNOSIS — I10 Essential (primary) hypertension: Secondary | ICD-10-CM | POA: Insufficient documentation

## 2020-04-21 DIAGNOSIS — Z452 Encounter for adjustment and management of vascular access device: Secondary | ICD-10-CM | POA: Insufficient documentation

## 2020-04-21 DIAGNOSIS — F17210 Nicotine dependence, cigarettes, uncomplicated: Secondary | ICD-10-CM | POA: Insufficient documentation

## 2020-04-21 DIAGNOSIS — I70219 Atherosclerosis of native arteries of extremities with intermittent claudication, unspecified extremity: Secondary | ICD-10-CM | POA: Diagnosis not present

## 2020-04-21 DIAGNOSIS — Z7901 Long term (current) use of anticoagulants: Secondary | ICD-10-CM | POA: Diagnosis not present

## 2020-04-21 DIAGNOSIS — Z7982 Long term (current) use of aspirin: Secondary | ICD-10-CM | POA: Insufficient documentation

## 2020-04-21 HISTORY — PX: LOWER EXTREMITY ANGIOGRAPHY: CATH118251

## 2020-04-21 LAB — BUN: BUN: 11 mg/dL (ref 8–23)

## 2020-04-21 LAB — CREATININE, SERUM
Creatinine, Ser: 0.75 mg/dL (ref 0.44–1.00)
GFR, Estimated: >60 mL/min (ref >60)

## 2020-04-21 SURGERY — LOWER EXTREMITY ANGIOGRAPHY
Anesthesia: Moderate Sedation | Site: Leg Lower | Laterality: Right

## 2020-04-21 MED ORDER — MIDAZOLAM HCL 2 MG/ML PO SYRP: 8.0000 mg | ORAL_SOLUTION | Freq: Once | ORAL | Status: DC | PRN

## 2020-04-21 MED ORDER — ONDANSETRON HCL 4 MG/2ML IJ SOLN: 4.0000 mg | Freq: Four times a day (QID) | INTRAMUSCULAR | Status: DC | PRN

## 2020-04-21 MED ORDER — MIDAZOLAM HCL 2 MG/2ML IJ SOLN
INTRAMUSCULAR | Status: DC | PRN
  Administered 2020-04-21: 1 mg via INTRAVENOUS
  Administered 2020-04-21: 2 mg via INTRAVENOUS

## 2020-04-21 MED ORDER — METHYLPREDNISOLONE SODIUM SUCC 125 MG IJ SOLR: 125.0000 mg | Freq: Once | INTRAMUSCULAR | Status: DC | PRN

## 2020-04-21 MED ORDER — HYDROMORPHONE HCL 1 MG/ML IJ SOLN: 1.0000 mg | Freq: Once | INTRAMUSCULAR | Status: DC | PRN

## 2020-04-21 MED ORDER — MIDAZOLAM HCL 5 MG/5ML IJ SOLN
INTRAMUSCULAR | Status: AC
  Filled 2020-04-21: qty 5

## 2020-04-21 MED ORDER — FENTANYL CITRATE (PF) 100 MCG/2ML IJ SOLN
INTRAMUSCULAR | Status: DC | PRN
  Administered 2020-04-21: 50 ug via INTRAVENOUS
  Administered 2020-04-21: 25 ug via INTRAVENOUS

## 2020-04-21 MED ORDER — CEFAZOLIN SODIUM-DEXTROSE 2-4 GM/100ML-% IV SOLN
INTRAVENOUS | Status: AC
  Administered 2020-04-21: 2 g via INTRAVENOUS
  Filled 2020-04-21: qty 100

## 2020-04-21 MED ORDER — DIPHENHYDRAMINE HCL 50 MG/ML IJ SOLN: 50.0000 mg | Freq: Once | INTRAMUSCULAR | Status: DC | PRN

## 2020-04-21 MED ORDER — FAMOTIDINE 20 MG PO TABS: 40.0000 mg | ORAL_TABLET | Freq: Once | ORAL | Status: DC | PRN

## 2020-04-21 MED ORDER — HEPARIN SODIUM (PORCINE) 1000 UNIT/ML IJ SOLN
INTRAMUSCULAR | Status: AC
  Filled 2020-04-21: qty 1

## 2020-04-21 MED ORDER — SODIUM CHLORIDE 0.9 % IV SOLN: INTRAVENOUS | Status: DC

## 2020-04-21 MED ORDER — FENTANYL CITRATE (PF) 100 MCG/2ML IJ SOLN
INTRAMUSCULAR | Status: AC
  Filled 2020-04-21: qty 2

## 2020-04-21 MED ORDER — CEFAZOLIN SODIUM-DEXTROSE 2-4 GM/100ML-% IV SOLN: 2.0000 g | Freq: Once | INTRAVENOUS | Status: AC

## 2020-04-21 SURGICAL SUPPLY — 9 items
CATH ANGIO 5F PIGTAIL 65CM (CATHETERS) ×2 IMPLANT
DEVICE STARCLOSE SE CLOSURE (Vascular Products) ×2 IMPLANT
KIT MICROPUNCTURE NIT STIFF (SHEATH) ×4 IMPLANT
PACK ANGIOGRAPHY (CUSTOM PROCEDURE TRAY) ×2 IMPLANT
SHEATH BRITE TIP 5FRX11 (SHEATH) ×2 IMPLANT
SYR MEDRAD MARK 7 150ML (SYRINGE) ×2 IMPLANT
TUBING CONTRAST HIGH PRESS 72 (TUBING) ×2 IMPLANT
WIRE GUIDERIGHT .035X150 (WIRE) ×2 IMPLANT
WIRE NITINOL .018 (WIRE) ×2 IMPLANT

## 2020-04-21 NOTE — Op Note (Signed)
[facility]  Percutaneous Study/Intervention Procedural Note   Date of Surgery: 04/21/2020  Surgeon(s):Emmet Messer    Assistants:none  Pre-operative Diagnosis: PAD with claudication and early rest [name] bilateral lower extremities  Post-operative diagnosis:  Same  Procedure(s) Performed:             1.  Ultrasound guidance for vascular access bilateral femoral arteries             2.  Catheter placement into aorta from right femoral approach and micropuncture sheath placement in the left femoral artery for imaging             3.  Aortogram and selective bilateral lower extremity angiogram             4.  StarClose closure device right femoral artery  EBL: 15 cc  Contrast: 65 cc  Fluoro Time: 1.6 minutes  Moderate Conscious Sedation Time: approximately 25 minutes using 3 mg of Versed and 75 mcg of Fentanyl              Indications:  Patient is a 70 y.o.female with severe peripheral arterial disease with worsening [name] particularly in the right lower extremity. The patient has noninvasive study showing monophasic flow with reduced perfusion bilaterally. The patient is brought in for angiography for further evaluation and potential treatment.  Due to the limb threatening nature of the situation, angiogram was performed for attempted limb salvage. The patient is aware that if the procedure fails, amputation would be expected.  The patient also understands that even with successful revascularization, amputation may still be required due to the severity of the situation.  Risks and benefits are discussed and informed consent is obtained.   Procedure:  The patient was identified and appropriate procedural time out was performed.  The patient was then placed supine on the table and prepped and draped in the usual sterile fashion. Moderate conscious sedation was administered during a face to face encounter with the patient throughout the procedure with my supervision of the  RN administering medicines and monitoring the patient's vital signs, pulse oximetry, telemetry and mental status throughout from the start of the procedure until the patient was taken to the recovery room. Ultrasound was used to evaluate the left common femoral artery.  It was heavily diseased and not very pulsatile.  I was able to access this with a micropuncture needle and a micropuncture wire was placed but would not pass.  I then stopped the proximal superficial femoral artery which was again heavily diseased and not very pulsatile.  This was done with a micropuncture needle and a micropuncture wire and sheath were placed although it would only go in a short segment.  Imaging through the sheath was done which showed occlusion of the left external iliac artery and common femoral artery.  I then elected to access the right femoral artery.  A digital ultrasound image was acquired.  A micropuncture needle was used to access the right common femoral artery under direct ultrasound guidance and a permanent image was performed.   A micropuncture wire and sheath were then placed.  A 0.035 J wire was advanced without resistance and a 5Fr sheath was placed.  Pigtail catheter was placed into the aorta and an AP aortogram was performed. This demonstrated normal renal arteries, the aorta was fairly normal into the distal segment where it became highly calcific.  The right common iliac artery did not have any hemodynamically significant stenosis.  The right external iliac artery  had a moderate stenosis in the 50 to 60% range.  The left common iliac artery appeared to occlude just below the previously placed stent.  The external iliac artery was occluded as was the left common femoral artery.  I then performed imaging through the right femoral sheath to evaluate the right lower extremity.  Selective right lower extremity angiogram was then performed. This demonstrated 70 to 80% stenosis of the right common femoral artery  extending down to the proximal portion of the profunda femoris artery.  Superficial femoral artery was occluded at its origin with reconstitution in the mid to distal superficial femoral artery.  There is then a normal typical trifurcation after a relatively normal popliteal artery.  There was moderate to high-grade stenosis in the proximal portion of the right anterior tibial arteries appear to be in the 70 to 80% range.  The peroneal and posterior tibial arteries appear to be patent without focal stenosis.  I then performed imaging of the left lower extremity through the left femoral sheath.  This demonstrated that the left common femoral artery was occluded as was the proximal portion of the superficial femoral artery.  The previously placed SFA stents were open with what appeared to be mild disease at the distal portion of the stents and just below the stents.  Popliteal artery seem to have good flow.  The tibial flow was somewhat sluggish due to the inflow lesion, but there appeared to be significant disease in the posterior tibial artery of greater than 80% in the proximal mid segment.  The peroneal artery and anterior tibial artery appear to have flow without obvious focal stenosis.  It was felt that it was in the patient's best interest to have a hybrid procedure of bilateral femoral endarterectomies as well as bilateral iliac interventions and potentially right SFA intervention at the time of the endarterectomies. I elected to terminate the procedure. The sheath was removed and StarClose closure device was deployed in the right femoral artery with excellent hemostatic result.  The micropuncture sheath was removed and the left femoral artery and pressure was held and sterile dressing placed.  The patient was taken to the recovery room in stable condition having tolerated the procedure well.  Findings:               Aortogram:  Normal renal arteries, the aorta was fairly normal into the distal segment  where it became highly calcific.  The right common iliac artery did not have any hemodynamically significant stenosis.  The right external iliac artery had a moderate stenosis in the 50 to 60% range.  The left common iliac artery appeared to occlude just below the previously placed stent.  The external iliac artery was occluded as was the left common femoral artery.             Right Lower Extremity:  This demonstrated 70 to 80% stenosis of the right common femoral artery extending down to the proximal portion of the profunda femoris artery.  Superficial femoral artery was occluded at its origin with reconstitution in the mid to distal superficial femoral artery.  There is then a normal typical trifurcation after a relatively normal popliteal artery.  There was moderate to high-grade stenosis in the proximal portion of the right anterior tibial arteries appear to be in the 70 to 80% range.  The peroneal and posterior tibial arteries appear to be patent without focal stenosis.  Left Lower Extremity: Left common femoral artery was occluded as was the proximal  portion of the superficial femoral artery.  The previously placed SFA stents were open with what appeared to be mild disease at the distal portion of the stents and just below the stents.  Popliteal artery seem to have good flow.  The tibial flow was somewhat sluggish due to the inflow lesion, but there appeared to be significant disease in the posterior tibial artery of greater than 80% in the proximal mid segment.  The peroneal artery and anterior tibial artery appear to have flow without obvious focal stenosis.   Disposition: Patient was taken to the recovery room in stable condition having tolerated the procedure well.  Complications: None  Joan [name] 04/21/2020 8:52 AM   This note was created with Dragon Medical transcription system. Any errors in dictation are purely unintentional.

## 2020-04-21 NOTE — Telephone Encounter (Signed)
Dr. Lucky Cowboy: This lady will need bilateral femoral endarterectomies, bilateral iliac stent placement, possible femoral to femoral bypass, and possible right SFA stent placement. Expect this 1 will take [place] about 4 hours.  I will get the patient scheduled soon.

## 2020-04-21 NOTE — Interval H&P Note (Signed)
History and Physical Interval Note:  04/21/2020 8:50 AM  Joan [name]  has presented today for surgery, with the diagnosis of RT LE Angio   BARD   ASO w claudication Covid  March 29.  The various methods of treatment have been discussed with the patient and family. After consideration of risks, benefits and other options for treatment, the patient has consented to  Procedure(s): LOWER EXTREMITY ANGIOGRAPHY (Right) as a surgical intervention.  The patient's history has been reviewed, patient examined, no change in status, stable for surgery.  I have reviewed the patient's chart and labs.  Questions were answered to the patient's satisfaction.     [name]

## 2020-04-26 ENCOUNTER — Other Ambulatory Visit: Admission: RE | Admit: 2020-04-26 | Payer: Medicare HMO | Source: Ambulatory Visit

## 2020-04-28 ENCOUNTER — Ambulatory Visit: Admission: RE | Admit: 2020-04-28 | Payer: Medicare HMO | Source: Home / Self Care | Admitting: Vascular Surgery

## 2020-04-28 ENCOUNTER — Encounter: Admission: RE | Payer: Self-pay | Source: Home / Self Care

## 2020-04-28 DIAGNOSIS — Z01818 Encounter for other preprocedural examination: Secondary | ICD-10-CM | POA: Diagnosis not present

## 2020-04-28 DIAGNOSIS — I739 Peripheral vascular disease, unspecified: Secondary | ICD-10-CM | POA: Diagnosis not present

## 2020-04-28 DIAGNOSIS — I70219 Atherosclerosis of native arteries of extremities with intermittent claudication, unspecified extremity: Secondary | ICD-10-CM

## 2020-04-28 DIAGNOSIS — I10 Essential (primary) hypertension: Secondary | ICD-10-CM | POA: Diagnosis not present

## 2020-04-28 DIAGNOSIS — F17200 Nicotine dependence, unspecified, uncomplicated: Secondary | ICD-10-CM | POA: Diagnosis not present

## 2020-04-28 DIAGNOSIS — I208 Other forms of angina pectoris: Secondary | ICD-10-CM | POA: Diagnosis not present

## 2020-04-28 DIAGNOSIS — R0602 Shortness of breath: Secondary | ICD-10-CM | POA: Diagnosis not present

## 2020-04-28 SURGERY — LOWER EXTREMITY ANGIOGRAPHY
Anesthesia: Moderate Sedation | Site: Leg Lower | Laterality: Left

## 2020-05-12 DIAGNOSIS — I739 Peripheral vascular disease, unspecified: Secondary | ICD-10-CM | POA: Diagnosis not present

## 2020-05-12 DIAGNOSIS — I208 Other forms of angina pectoris: Secondary | ICD-10-CM | POA: Diagnosis not present

## 2020-05-12 DIAGNOSIS — Z01818 Encounter for other preprocedural examination: Secondary | ICD-10-CM | POA: Diagnosis not present

## 2020-05-12 DIAGNOSIS — R0602 Shortness of breath: Secondary | ICD-10-CM | POA: Diagnosis not present

## 2020-06-02 ENCOUNTER — Telehealth (INDEPENDENT_AMBULATORY_CARE_PROVIDER_SITE_OTHER): Payer: Self-pay

## 2020-06-02 NOTE — Telephone Encounter (Signed)
Spoke with the patient and she is scheduled with Dr. Lucky Cowboy for a bilateral femoral endarterectomies, bilateral iliac stents poss fem/fem bypass and right SFA Stent placement on 06/22/20. Patient will do a phone call pre-op on 06/13/20 between 8-1 pm and covid testing on 06/21/20 before 10:00 am at the [facility]. Pre-surgical instructions were discussed and will be mailed.

## 2020-06-09 ENCOUNTER — Other Ambulatory Visit (INDEPENDENT_AMBULATORY_CARE_PROVIDER_SITE_OTHER): Payer: Self-pay | Admitting: Nurse Practitioner

## 2020-06-13 ENCOUNTER — Encounter
Admission: RE | Admit: 2020-06-13 | Discharge: 2020-06-13 | Disposition: A | Discharge status: Home / Self Care | Payer: Medicare HMO | Source: Ambulatory Visit | Attending: Vascular Surgery | Admitting: Vascular Surgery

## 2020-06-13 ENCOUNTER — Other Ambulatory Visit: Payer: Self-pay

## 2020-06-13 HISTORY — DX: Anxiety disorder, unspecified: F41.9

## 2020-06-13 NOTE — Patient Instructions (Addendum)
INSTRUCTIONS FOR SURGERY     Your surgery is scheduled for:   Wednesday, June 1ST     To find out your arrival time for the day of surgery,          please call [phone] between 1 pm and 3 pm on : Tuesday, MAY 31ST     When you arrive for surgery, report to the [facility]. ONCE THEY HAVE COMPLETED THEIR PROCESS, PROCEED TO THE SECOND FLOOR AND SIGN IN AT THE SURGERY DESK.    REMEMBER: Instructions that are not followed completely may result in serious medical risk,  up to and including death, or upon the discretion of your surgeon and anesthesiologist,            your surgery may need to be rescheduled.  __X__ 1. Do not eat food after midnight the night before your procedure.                    No gum, candy, lozenger, tic tacs, tums or hard candies.                  ABSOLUTELY NOTHING SOLID IN YOUR MOUTH AFTER MIDNIGHT                    You may drink unlimited clear liquids up to 2 hours before you are scheduled to arrive for surgery.                   Do not drink anything within those 2 hours unless you need to take medicine, then take the                   smallest amount you need.  Clear liquids include:  water, apple juice without pulp,                   any flavor Gatorade, Black coffee, black tea.  Sugar may be added but no dairy/ honey /lemon.                        Broth and jello is not considered a clear liquid.  __x__  2. On the morning of surgery, please brush your teeth with toothpaste and water. You may rinse with                  mouthwash if you wish but DO NOT SWALLOW TOOTHPASTE OR MOUTHWASH  __X___3. NO alcohol for 24 hours before or after surgery.  __x___ 4.  Do NOT smoke or use e-cigarettes for 24 HOURS PRIOR TO SURGERY.                      DO NOT use any chewable tobacco products for at least 6 hours prior to surgery.  __x___ 5. If you start any new  medication after this appointment and prior to surgery, please                   Bring it with you on the day of surgery.  ___x__ 6. Notify your doctor  if there is any change in your medical condition, such as fever,                  infection, vomitting, diarrhea or any open sores.  __x___ 7.  USE the CHG SOAP as instructed, the night before surgery and the day of surgery.                   Once you have washed with this soap, do NOT use any of the following: Powders, perfumes                    or lotions. Please do not wear make up, hairpins, clips or nail polish. You MAY wear deodorant.                                                     Women need to shave 48 hours prior to surgery.                   DO NOT wear ANY jewelry on the day of surgery. If there are rings that are too tight to                    remove easily, please address this prior to the surgery day. Piercings need to be removed.                                                                    NO METAL ON YOUR BODY.                   Do NOT bring any valuables.  If you came to Pre-Admit testing then you will not need license,                    insurance card or credit card.  If you will be staying overnight, please either leave your things in                    the car or have your family be responsible for these items.                    [facility] IS NOT RESPONSIBLE FOR BELONGINGS OR VALUABLES.  ___X__ 8. DO NOT wear contact lenses on surgery day.  You may not have dentures,                     Hearing aides, contacts or glasses in the operating room. These items can be                    Placed in the Recovery Room to receive immediately after surgery.  __x___ 9. IF YOU ARE SCHEDULED TO GO HOME ON THE SAME DAY, YOU MUST                   Have someone to drive you home and to stay with you  for the first 24 hours.  Have an arrangement prior to arriving on surgery day.  ___x__ 10. Take the  following medications on the morning of surgery with a sip of water:                              1. XANAX, if you feel you need it                     2. ZOLOFT                     3. CLARITIN                     4.                    _____ 11.  Follow any instructions provided to you by your surgeon.                        Such as enema, clear liquid bowel prep  __X__  12. STOP PLAVIX AS OF 7 DAYS PRIOR TO SURGERY, ON 06/15/20.                      CONTINUE TAKING ASPIRIN BUT DO NOT TAKE ON DAY OF SURGERY.                    STOP ANY OTHER ASPIRIN PRODUCTS NOW, 06/13/20.                                                 THIS INCLUDES BC POWDERS / GOODIES POWDER  __x___ 13. STOP Anti-inflammatories as of TODAY, MAY 23RD                      This includes IBUPROFEN / MOTRIN / ADVIL / ALEVE/ NAPROXYN                    YOU MAY TAKE TYLENOL ANY TIME PRIOR TO SURGERY.  ___X___17.  Continue to take the following medications but do not take on the morning of surgery:                             LISINOPRIL // ASPIRIN  __X____18. If staying overnight, please have appropriate shoes to wear to be able to walk around the unit.                   Wear clean and comfortable clothing to the hospital.  REMEMBER PHONE NUMBERS FOR YOUR CONTACTS BRING CELL PHONE AND CHARGER.

## 2020-06-14 ENCOUNTER — Encounter: Payer: Self-pay | Admitting: Vascular Surgery

## 2020-06-17 ENCOUNTER — Encounter: Payer: Self-pay | Admitting: Vascular Surgery

## 2020-06-17 NOTE — Progress Notes (Signed)
Perioperative Services  Pre-Admission/Anesthesia Testing Clinical Review  Date: 06/21/20  Patient Demographics:  Name: Joan [name] DOB:   November 27, 1950 MRN:   [id number]  Planned Surgical Procedure(s):    Case: [id number] Date/Time: 06/22/20 0815   Procedures:      ENDARTERECTOMY FEMORAL (Bilateral )     INSERTION OF ILIAC STENT (Bilateral )     APPLICATION OF CELL SAVER (N/A )   Anesthesia type: General   Pre-op diagnosis: endarterectomy   Location: [facility] OR ROOM 08 / [place] [facility]   Surgeons: Algernon Huxley, MD    NOTE: Available PAT nursing documentation and vital signs have been reviewed. Clinical nursing staff has updated patient's PMH/PSHx, current medication list, and drug allergies/intolerances to ensure comprehensive history available to assist in medical decision making as it pertains to the aforementioned surgical procedure and anticipated anesthetic course.   Clinical Discussion:  Joan [name] is a 70 y.o. female who is submitted for pre-surgical anesthesia review and clearance prior to her undergoing the above procedure. Patient is a Current Smoker (25 pack years). Pertinent PMH includes: angina, PVD with lower extremity claudication, TIA, dyspnea, GERD (no daily Tx), OA, breast cancer, anxiety (on BZO), depression.  Extensive review of available clinical information performed. [place] [facility] and [facility] updated with any diagnoses/procedures that  may have been inadvertently omitted during her intake with the pre-admission testing department's nursing staff.  Patient is followed by cardiology Clayborn Bigness, MD). She was last seen in the cardiology clinic on 04/28/2020. Last office note has been requested for review as it is not displaying in [place]; advised per office that it is pending dictation. Will review if received and update this note with any pertinent clinical data. In review of the available clinical documentation it is noted that patient has a  history significant for cardiovascular diagnoses.     Patient underwent ABI studies in 02/2020 revealing moderate RIGHT and mild LEFT lower extremity arterial disease.   Follow-up lower extremity Doppler study performed on 04/08/2020 significant for SFA occlusion on the RIGHT and significant iliac level arterial disease on the LEFT.     Last TTE performed on 05/12/2020 revealed a normal left ventricular systolic function with moderate LVH and no evidence of valvular stenosis; LVEF >55%.     Subsequent myocardial perfusion imaging study demonstrated no evidence of stress-induced myocardial ischemia or arrhythmia (see full interpretation of cardiovascular testing below).  Patient remained on daily DAPT therapy (ASA + clopidogrel). He was on GDMT for her HTN and HLD diagnoses.  Blood pressure elevated at 152/82 on currently prescribed ACEi monotherapy.  Patient is on a statin for her HLD. As per recent non-invasive cardiovascular testing, DASI defined functional capacity >/= 4 METS.  Patient reports that no changes were made to her medication regimen.  Patient was to follow-up with outpatient cardiology after her testing to discuss results, however as of today, it appears as if she has not has a RTC visit.   Patient is scheduled for a BILATERAL femoral endarterectomies on 06/22/2020 with Dr. [name].  Given patient's past medical history significant for cardiovascular diagnoses, presurgical cardiac clearance was sought by the performing surgeon's office and PAT team. Per cardiology, "this patient is optimized for surgery and may proceed with the planned procedural course with a MODERATE risk stratification".  Again, this patient is on daily DAPT therapy.  She has been instructed on recommendations from her cardiologist and vascular team for holding her clopidogrel for 7 days prior  to his procedure with plans to restart as soon as postoperative bleeding risk felt to be minimized by primary attending  surgeon.  Patient will continue his daily low-dose ASA throughout the perioperative period.  Patient is aware that his last dose of clopidogrel will be on 06/14/2020.  Patient denies previous perioperative complications with anesthesia in the past. In review of the available records, there are no records for review regarding patient's past surgical/anesthetic courses within the [facility] system.  Vitals with BMI 06/13/2020 04/21/2020 04/21/2020  Height 5\' 4"  - -  Weight 153 lbs - -  BMI 44.31 - -  Systolic - 540 -  Diastolic - 70 -  Pulse - 65 64    Providers/Specialists:   NOTE: Primary physician provider listed below. Patient may have been seen by APP or partner within same practice.   PROVIDER ROLE / SPECIALTY LAST Imelda Pillow, MD  Vascular Surgery  04/08/2020  Tracie Harrier, MD  Primary Care Provider  02/05/2020  Katrine Coho, MD  Cardiology  04/28/2020   Allergies:  Seasonal ic [cholestatin]  Current Home Medications:   No current facility-administered medications for this encounter.   Marland Kitchen ALPRAZolam (XANAX) 0.5 MG tablet  . aspirin EC 81 MG tablet  . clopidogrel (PLAVIX) 75 MG tablet  . lisinopril (PRINIVIL,ZESTRIL) 20 MG tablet  . loratadine (CLARITIN) 10 MG tablet  . rosuvastatin (CRESTOR) 5 MG tablet  . sertraline (ZOLOFT) 100 MG tablet  . atorvastatin (LIPITOR) 10 MG tablet   History:   Past Medical History:  Diagnosis Date  . Angina at rest [facility])   . Anxiety   . Arthritis   . Breast cancer, right breast ([place])    s/p lumpectomy and adjuvant XRT  . Bronchitis   . Cancer ([place])   . Claudication, intermittent ([place])   . Depression   . Dyspnea   . Environmental and seasonal allergies   . GERD (gastroesophageal reflux disease)    with spicy food  . Hepatitis C antibody test positive   . Hypertension   . Peripheral vascular disease ([place])   . Personal history of radiation therapy 2002   RIGHT lumpectomy  . TIA (transient ischemic attack)    no  residual. Many years ago   Past Surgical History:  Procedure Laterality Date  . APPENDECTOMY    . BREAST EXCISIONAL BIOPSY Right    11/1999  . BREAST LUMPECTOMY Right 2002   RIGHT lumpectomy w/ radiation 2002  . BREAST SURGERY    . CHOLECYSTECTOMY    . LOWER EXTREMITY ANGIOGRAPHY Left 11/14/2017   Procedure: LOWER EXTREMITY ANGIOGRAPHY;  Surgeon: Algernon Huxley, MD;  Location: [place] [facility];  Service: Cardiovascular;  Laterality: Left;  . LOWER EXTREMITY ANGIOGRAPHY Right 04/21/2020   Procedure: LOWER EXTREMITY ANGIOGRAPHY;  Surgeon: Algernon Huxley, MD;  Location: [place] [facility];  Service: Cardiovascular;  Laterality: Right;   Family History  Problem Relation Age of Onset  . Breast cancer Mother 44  . Breast cancer Maternal Aunt   . Breast cancer Maternal Grandmother   . Breast cancer Sister 57   Social History   Tobacco Use  . Smoking status: Current Every Day Smoker    Packs/day: 0.50    Years: 50.00    Pack years: 25.00    Types: Cigarettes  . Smokeless tobacco: Never Used  . Tobacco comment: trying to quit on her own  Vaping Use  . Vaping Use: Never used  Substance Use Topics  .  Alcohol use: Not Currently    Comment: Beer heavy everyday in past - last 10 years ago  . Drug use: No    Pertinent Clinical Results:  LABS: Labs reviewed: Acceptable for surgery.  Hospital Outpatient Visit on 06/21/2020  Component Date Value Ref Range Status  . WBC 06/21/2020 9.2  4.0 - 10.5 K/uL Final  . RBC 06/21/2020 4.72  3.87 - 5.11 MIL/uL Final  . Hemoglobin 06/21/2020 13.9  12.0 - 15.0 g/dL Final  . HCT 06/21/2020 42.1  36.0 - 46.0 % Final  . MCV 06/21/2020 89.2  80.0 - 100.0 fL Final  . MCH 06/21/2020 29.4  26.0 - 34.0 pg Final  . MCHC 06/21/2020 33.0  30.0 - 36.0 g/dL Final  . RDW 06/21/2020 13.5  11.5 - 15.5 % Final  . Platelets 06/21/2020 296  150 - 400 K/uL Final  . nRBC 06/21/2020 0.0  0.0 - 0.2 % Final  . Neutrophils Relative % 06/21/2020 50  % Final  .  Neutro Abs 06/21/2020 4.6  1.7 - 7.7 K/uL Final  . Lymphocytes Relative 06/21/2020 38  % Final  . Lymphs Abs 06/21/2020 3.4  0.7 - 4.0 K/uL Final  . Monocytes Relative 06/21/2020 8  % Final  . Monocytes Absolute 06/21/2020 0.8  0.1 - 1.0 K/uL Final  . Eosinophils Relative 06/21/2020 3  % Final  . Eosinophils Absolute 06/21/2020 0.3  0.0 - 0.5 K/uL Final  . Basophils Relative 06/21/2020 1  % Final  . Basophils Absolute 06/21/2020 0.1  0.0 - 0.1 K/uL Final  . Immature Granulocytes 06/21/2020 0  % Final  . Abs Immature Granulocytes 06/21/2020 0.03  0.00 - 0.07 K/uL Final   Performed at [facility], [address], [place], [place] [address]  . Sodium 06/21/2020 137  135 - 145 mmol/L Final  . Potassium 06/21/2020 4.2  3.5 - 5.1 mmol/L Final  . Chloride 06/21/2020 105  98 - 111 mmol/L Final  . CO2 06/21/2020 23  22 - 32 mmol/L Final  . Glucose, Bld 06/21/2020 84  70 - 99 mg/dL Final   Glucose reference range applies only to samples taken after fasting for at least 8 hours.  . BUN 06/21/2020 14  8 - 23 mg/dL Final  . Creatinine, Ser 06/21/2020 0.72  0.44 - 1.00 mg/dL Final  . Calcium 06/21/2020 9.1  8.9 - 10.3 mg/dL Final  . GFR, Estimated 06/21/2020 >60  >60 mL/min Final   Comment: (NOTE) Calculated using the CKD-EPI Creatinine Equation (2021)   . Anion gap 06/21/2020 9  5 - 15 Final   Performed at [facility], [address] [place], [place] [address]  . Prothrombin Time 06/21/2020 13.2  11.4 - 15.2 seconds Final  . INR 06/21/2020 1.0  0.8 - 1.2 Final   Comment: (NOTE) INR goal varies based on device and disease states. Performed at [facility], [address], [place], [place] [address]   . aPTT 06/21/2020 27  24 - 36 seconds Final   Performed at [facility], [place], [place] [address]    ECG: Date: 06/21/2020 Time ECG obtained: 0858 AM Rate: 58 bpm Rhythm: Sinus bradycardia with PVCs Axis (leads I and aVF):  Normal Intervals: PR 190 ms. QRS 68 ms. QTc 412 ms. ST segment and T wave changes: No evidence of acute ST segment elevation or depression Comparison: Similar to previous tracing obtained on 05/12/2020   IMAGING / PROCEDURES: LEXISCAN performed on 05/12/2020 1. LVEF 52%  2. Regional wall motion reveals normal myocardial thickening and wall motion 3. No artifacts noted 4. Left ventricular cavity size normal 5. No evidence of stress-induced myocardial ischemia or arrhythmia 6. The overall quality of study is good  TRANSTHORACIC ECHOCARDIOGRAM performed on 05/12/2020 1. LVEF >55% 2. Normal left ventricular systolic function with moderate LVH 3. Normal right ventricular systolic function 4. Trivial MR and PR 5. No AR or TR 6. No valvular stenosis 7. No evidence of a pericardial effusion  VASCULAR ULTRASOUND ABI WITH/WITHOUT TBI performed on 03/15/2020 1. Right:   Resting right ankle-brachial index indicates moderate right lower extremity arterial disease.   The right toe-brachial index is abnormal.  2. Left:   Resting left ankle-brachial index indicates mild left lower extremity arterial disease.   The left toe-brachial index is normal.   Impression and Plan:  MALYSSA MARIS has been referred for pre-anesthesia review and clearance prior to her undergoing the planned anesthetic and procedural courses. Available labs, pertinent testing, and imaging results were personally reviewed by me. This patient has been appropriately cleared by cardiology with an overall MODERATE risk of significant perioperative cardiovascular complications.  Based on clinical review performed today (06/21/20), barring any significant acute changes in the patient's overall condition, it is anticipated that she will be able to proceed with the planned surgical intervention. Any acute changes in clinical condition may necessitate her procedure being postponed and/or cancelled. Patient will meet with anesthesia  team (MD and/or CRNA) on the day of her procedure for preoperative evaluation/assessment. Questions regarding anesthetic course will be fielded at that time.   Pre-surgical instructions were reviewed with the patient during her PAT appointment and questions were fielded by PAT clinical staff. Patient was advised that if any questions or concerns arise prior to her procedure then she should return a call to PAT and/or her surgeon's office to discuss.  Honor Loh, MSN, APRN, FNP-C, CEN [facility]  Peri-operative Services Nurse Practitioner Phone: [phone] 06/21/20 12:18 PM  NOTE: This note has been prepared using Dragon dictation software. Despite my best ability to proofread, there is always the potential that unintentional transcriptional errors may still occur from this process.

## 2020-06-21 ENCOUNTER — Other Ambulatory Visit: Payer: Self-pay

## 2020-06-21 ENCOUNTER — Other Ambulatory Visit
Admission: RE | Admit: 2020-06-21 | Discharge: 2020-06-21 | Disposition: A | Discharge status: Home / Self Care | Payer: Medicare HMO | Source: Ambulatory Visit | Attending: Vascular Surgery | Admitting: Vascular Surgery

## 2020-06-21 DIAGNOSIS — Z923 Personal history of irradiation: Secondary | ICD-10-CM | POA: Diagnosis not present

## 2020-06-21 DIAGNOSIS — Z20822 Contact with and (suspected) exposure to covid-19: Secondary | ICD-10-CM | POA: Diagnosis present

## 2020-06-21 DIAGNOSIS — Z9862 Peripheral vascular angioplasty status: Secondary | ICD-10-CM | POA: Diagnosis not present

## 2020-06-21 DIAGNOSIS — I7090 Unspecified atherosclerosis: Secondary | ICD-10-CM | POA: Insufficient documentation

## 2020-06-21 DIAGNOSIS — I10 Essential (primary) hypertension: Secondary | ICD-10-CM | POA: Diagnosis present

## 2020-06-21 DIAGNOSIS — E1151 Type 2 diabetes mellitus with diabetic peripheral angiopathy without gangrene: Secondary | ICD-10-CM | POA: Diagnosis present

## 2020-06-21 DIAGNOSIS — Z853 Personal history of malignant neoplasm of breast: Secondary | ICD-10-CM | POA: Diagnosis not present

## 2020-06-21 DIAGNOSIS — K219 Gastro-esophageal reflux disease without esophagitis: Secondary | ICD-10-CM | POA: Diagnosis present

## 2020-06-21 DIAGNOSIS — Z8673 Personal history of transient ischemic attack (TIA), and cerebral infarction without residual deficits: Secondary | ICD-10-CM | POA: Diagnosis not present

## 2020-06-21 DIAGNOSIS — F17210 Nicotine dependence, cigarettes, uncomplicated: Secondary | ICD-10-CM | POA: Diagnosis present

## 2020-06-21 DIAGNOSIS — Z7902 Long term (current) use of antithrombotics/antiplatelets: Secondary | ICD-10-CM | POA: Diagnosis not present

## 2020-06-21 DIAGNOSIS — I70219 Atherosclerosis of native arteries of extremities with intermittent claudication, unspecified extremity: Secondary | ICD-10-CM | POA: Diagnosis not present

## 2020-06-21 DIAGNOSIS — Z79899 Other long term (current) drug therapy: Secondary | ICD-10-CM | POA: Diagnosis not present

## 2020-06-21 DIAGNOSIS — E871 Hypo-osmolality and hyponatremia: Secondary | ICD-10-CM | POA: Diagnosis not present

## 2020-06-21 DIAGNOSIS — F419 Anxiety disorder, unspecified: Secondary | ICD-10-CM | POA: Diagnosis present

## 2020-06-21 DIAGNOSIS — I493 Ventricular premature depolarization: Secondary | ICD-10-CM | POA: Insufficient documentation

## 2020-06-21 DIAGNOSIS — Z7982 Long term (current) use of aspirin: Secondary | ICD-10-CM | POA: Diagnosis not present

## 2020-06-21 DIAGNOSIS — F32A Depression, unspecified: Secondary | ICD-10-CM | POA: Diagnosis present

## 2020-06-21 DIAGNOSIS — Z01818 Encounter for other preprocedural examination: Secondary | ICD-10-CM | POA: Diagnosis not present

## 2020-06-21 DIAGNOSIS — I70223 Atherosclerosis of native arteries of extremities with rest pain, bilateral legs: Secondary | ICD-10-CM | POA: Diagnosis present

## 2020-06-21 LAB — BASIC METABOLIC PANEL
Anion gap: 9 (ref 5–15)
BUN: 14 mg/dL (ref 8–23)
CO2: 23 mmol/L (ref 22–32)
Calcium: 9.1 mg/dL (ref 8.9–10.3)
Chloride: 105 mmol/L (ref 98–111)
Creatinine, Ser: 0.72 mg/dL (ref 0.44–1.00)
GFR, Estimated: >60 mL/min (ref >60)
Glucose, Bld: 84 mg/dL (ref 70–99)
Potassium: 4.2 mmol/L (ref 3.5–5.1)
Sodium: 137 mmol/L (ref 135–145)

## 2020-06-21 LAB — TYPE AND SCREEN
ABO/RH(D): O POS
Antibody Screen: NEGATIVE

## 2020-06-21 LAB — CBC WITH DIFFERENTIAL/PLATELET
Abs Immature Granulocytes: 0.03 10*3/uL (ref 0.00–0.07)
Basophils Absolute: 0.1 10*3/uL (ref 0.0–0.1)
Basophils Relative: 1 %
Eosinophils Absolute: 0.3 10*3/uL (ref 0.0–0.5)
Eosinophils Relative: 3 %
HCT: 42.1 % (ref 36.0–46.0)
Hemoglobin: 13.9 g/dL (ref 12.0–15.0)
Immature Granulocytes: 0 %
Lymphocytes Relative: 38 %
Lymphs Abs: 3.4 10*3/uL (ref 0.7–4.0)
MCH: 29.4 pg (ref 26.0–34.0)
MCHC: 33 g/dL (ref 30.0–36.0)
MCV: 89.2 fL (ref 80.0–100.0)
Monocytes Absolute: 0.8 10*3/uL (ref 0.1–1.0)
Monocytes Relative: 8 %
Neutro Abs: 4.6 10*3/uL (ref 1.7–7.7)
Neutrophils Relative %: 50 %
Platelets: 296 10*3/uL (ref 150–400)
RBC: 4.72 MIL/uL (ref 3.87–5.11)
RDW: 13.5 % (ref 11.5–15.5)
WBC: 9.2 10*3/uL (ref 4.0–10.5)
nRBC: 0 % (ref 0.0–0.2)

## 2020-06-21 LAB — SARS CORONAVIRUS 2 (TAT 6-24 HRS): SARS Coronavirus 2: NEGATIVE

## 2020-06-21 LAB — PROTIME-INR
INR: 1 (ref 0.8–1.2)
Prothrombin Time: 13.2 seconds (ref 11.4–15.2)

## 2020-06-21 LAB — APTT: aPTT: 27 seconds (ref 24–36)

## 2020-06-22 ENCOUNTER — Other Ambulatory Visit: Payer: Self-pay

## 2020-06-22 ENCOUNTER — Inpatient Hospital Stay: Payer: Medicare HMO | Admitting: Urgent Care

## 2020-06-22 ENCOUNTER — Encounter: Payer: Self-pay | Admitting: Vascular Surgery

## 2020-06-22 ENCOUNTER — Encounter
Admission: RE | Disposition: A | Discharge status: Home / Self Care | Payer: Self-pay | Source: Home / Self Care | Attending: Vascular Surgery

## 2020-06-22 ENCOUNTER — Inpatient Hospital Stay
Admission: RE | Admit: 2020-06-22 | Discharge: 2020-06-25 | DRG: 254 | Disposition: A | Discharge status: Home Health | Payer: Medicare HMO | Attending: Vascular Surgery | Admitting: Vascular Surgery

## 2020-06-22 ENCOUNTER — Inpatient Hospital Stay: Payer: Medicare HMO

## 2020-06-22 DIAGNOSIS — K219 Gastro-esophageal reflux disease without esophagitis: Secondary | ICD-10-CM | POA: Diagnosis present

## 2020-06-22 DIAGNOSIS — I10 Essential (primary) hypertension: Secondary | ICD-10-CM | POA: Diagnosis present

## 2020-06-22 DIAGNOSIS — Z79899 Other long term (current) drug therapy: Secondary | ICD-10-CM | POA: Diagnosis not present

## 2020-06-22 DIAGNOSIS — Z923 Personal history of irradiation: Secondary | ICD-10-CM | POA: Diagnosis not present

## 2020-06-22 DIAGNOSIS — F419 Anxiety disorder, unspecified: Secondary | ICD-10-CM | POA: Diagnosis present

## 2020-06-22 DIAGNOSIS — Z9862 Peripheral vascular angioplasty status: Secondary | ICD-10-CM | POA: Diagnosis not present

## 2020-06-22 DIAGNOSIS — F17210 Nicotine dependence, cigarettes, uncomplicated: Secondary | ICD-10-CM | POA: Diagnosis present

## 2020-06-22 DIAGNOSIS — Z7982 Long term (current) use of aspirin: Secondary | ICD-10-CM

## 2020-06-22 DIAGNOSIS — I70219 Atherosclerosis of native arteries of extremities with intermittent claudication, unspecified extremity: Secondary | ICD-10-CM | POA: Diagnosis not present

## 2020-06-22 DIAGNOSIS — I70223 Atherosclerosis of native arteries of extremities with rest pain, bilateral legs: Secondary | ICD-10-CM | POA: Diagnosis present

## 2020-06-22 DIAGNOSIS — E1151 Type 2 diabetes mellitus with diabetic peripheral angiopathy without gangrene: Principal | ICD-10-CM | POA: Diagnosis present

## 2020-06-22 DIAGNOSIS — Z7902 Long term (current) use of antithrombotics/antiplatelets: Secondary | ICD-10-CM | POA: Diagnosis not present

## 2020-06-22 DIAGNOSIS — F32A Depression, unspecified: Secondary | ICD-10-CM | POA: Diagnosis present

## 2020-06-22 DIAGNOSIS — Z20822 Contact with and (suspected) exposure to covid-19: Secondary | ICD-10-CM | POA: Diagnosis present

## 2020-06-22 DIAGNOSIS — Z8673 Personal history of transient ischemic attack (TIA), and cerebral infarction without residual deficits: Secondary | ICD-10-CM | POA: Diagnosis not present

## 2020-06-22 DIAGNOSIS — Z853 Personal history of malignant neoplasm of breast: Secondary | ICD-10-CM

## 2020-06-22 DIAGNOSIS — I70229 Atherosclerosis of native arteries of extremities with rest pain, unspecified extremity: Secondary | ICD-10-CM | POA: Diagnosis present

## 2020-06-22 DIAGNOSIS — E871 Hypo-osmolality and hyponatremia: Secondary | ICD-10-CM | POA: Diagnosis not present

## 2020-06-22 HISTORY — PX: ENDARTERECTOMY FEMORAL: SHX5804

## 2020-06-22 HISTORY — PX: INSERTION OF ILIAC STENT: SHX6256

## 2020-06-22 HISTORY — DX: Other forms of angina pectoris: I20.8

## 2020-06-22 HISTORY — DX: Other forms of angina pectoris: I20.89

## 2020-06-22 LAB — CREATININE, SERUM
Creatinine, Ser: 0.7 mg/dL (ref 0.44–1.00)
GFR, Estimated: >60 mL/min (ref >60)

## 2020-06-22 LAB — CBC
HCT: 36.1 % (ref 36.0–46.0)
Hemoglobin: 12 g/dL (ref 12.0–15.0)
MCH: 30.5 pg (ref 26.0–34.0)
MCHC: 33.2 g/dL (ref 30.0–36.0)
MCV: 91.6 fL (ref 80.0–100.0)
Platelets: 230 10*3/uL (ref 150–400)
RBC: 3.94 MIL/uL (ref 3.87–5.11)
RDW: 13.8 % (ref 11.5–15.5)
WBC: 15.7 10*3/uL — ABNORMAL HIGH (ref 4.0–10.5)
nRBC: 0 % (ref 0.0–0.2)

## 2020-06-22 LAB — MRSA PCR SCREENING: MRSA by PCR: NEGATIVE

## 2020-06-22 LAB — GLUCOSE, CAPILLARY: Glucose-Capillary: 117 mg/dL — ABNORMAL HIGH (ref 70–99)

## 2020-06-22 LAB — ABO/RH: ABO/RH(D): O POS

## 2020-06-22 SURGERY — ENDARTERECTOMY, FEMORAL
Anesthesia: General

## 2020-06-22 MED ORDER — ONDANSETRON HCL 4 MG/2ML IJ SOLN: 4.0000 mg | Freq: Four times a day (QID) | INTRAMUSCULAR | Status: DC | PRN

## 2020-06-22 MED ORDER — PROPOFOL 500 MG/50ML IV EMUL
INTRAVENOUS | Status: DC | PRN
  Administered 2020-06-22: 20 ug/kg/min via INTRAVENOUS

## 2020-06-22 MED ORDER — MIDAZOLAM HCL 2 MG/2ML IJ SOLN
INTRAMUSCULAR | Status: DC | PRN
  Administered 2020-06-22 (×2): 1 mg via INTRAVENOUS

## 2020-06-22 MED ORDER — SENNOSIDES-DOCUSATE SODIUM 8.6-50 MG PO TABS: 1.0000 | ORAL_TABLET | Freq: Every evening | ORAL | Status: DC | PRN

## 2020-06-22 MED ORDER — LORATADINE 10 MG PO TABS: 10.0000 mg | ORAL_TABLET | Freq: Every day | ORAL | Status: DC | PRN

## 2020-06-22 MED ORDER — LACTATED RINGERS IV SOLN: INTRAVENOUS | Status: DC

## 2020-06-22 MED ORDER — MEPERIDINE HCL 25 MG/ML IJ SOLN: 6.2500 mg | INTRAMUSCULAR | Status: DC | PRN

## 2020-06-22 MED ORDER — HEPARIN SODIUM (PORCINE) 1000 UNIT/ML IJ SOLN
INTRAMUSCULAR | Status: AC
  Filled 2020-06-22: qty 1

## 2020-06-22 MED ORDER — FENTANYL CITRATE (PF) 100 MCG/2ML IJ SOLN
25.0000 ug | INTRAMUSCULAR | Status: DC | PRN
  Administered 2020-06-22 (×3): 25 ug via INTRAVENOUS

## 2020-06-22 MED ORDER — LIDOCAINE HCL (PF) 2 % IJ SOLN
INTRAMUSCULAR | Status: AC
  Filled 2020-06-22: qty 5

## 2020-06-22 MED ORDER — HALOPERIDOL LACTATE 5 MG/ML IJ SOLN: 2.5000 mg | Freq: Once | INTRAMUSCULAR | Status: AC

## 2020-06-22 MED ORDER — PROMETHAZINE HCL 25 MG/ML IJ SOLN: 6.2500 mg | INTRAMUSCULAR | Status: DC | PRN

## 2020-06-22 MED ORDER — FENTANYL CITRATE (PF) 100 MCG/2ML IJ SOLN
INTRAMUSCULAR | Status: AC
  Filled 2020-06-22: qty 2

## 2020-06-22 MED ORDER — GLYCOPYRROLATE 0.2 MG/ML IJ SOLN
INTRAMUSCULAR | Status: AC
  Filled 2020-06-22: qty 1

## 2020-06-22 MED ORDER — CHLORHEXIDINE GLUCONATE 0.12 % MT SOLN
15.0000 mL | Freq: Once | OROMUCOSAL | Status: AC
  Administered 2020-06-22: 15 mL via OROMUCOSAL

## 2020-06-22 MED ORDER — ONDANSETRON HCL 4 MG/2ML IJ SOLN
INTRAMUSCULAR | Status: AC
  Filled 2020-06-22: qty 2

## 2020-06-22 MED ORDER — HALOPERIDOL LACTATE 5 MG/ML IJ SOLN: 2.5000 mg | Freq: Four times a day (QID) | INTRAMUSCULAR | Status: DC | PRN

## 2020-06-22 MED ORDER — DOCUSATE SODIUM 100 MG PO CAPS
100.0000 mg | ORAL_CAPSULE | Freq: Every day | ORAL | Status: DC
  Administered 2020-06-23 – 2020-06-25 (×3): 100 mg via ORAL
  Filled 2020-06-22 (×3): qty 1

## 2020-06-22 MED ORDER — LABETALOL HCL 5 MG/ML IV SOLN
10.0000 mg | INTRAVENOUS | Status: DC | PRN
  Administered 2020-06-23 – 2020-06-24 (×2): 10 mg via INTRAVENOUS
  Filled 2020-06-22 (×2): qty 4

## 2020-06-22 MED ORDER — PAPAVERINE HCL 30 MG/ML IJ SOLN
INTRAMUSCULAR | Status: AC
  Filled 2020-06-22: qty 2

## 2020-06-22 MED ORDER — DEXAMETHASONE SODIUM PHOSPHATE 10 MG/ML IJ SOLN
INTRAMUSCULAR | Status: AC
  Filled 2020-06-22: qty 1

## 2020-06-22 MED ORDER — MIDAZOLAM HCL 2 MG/2ML IJ SOLN
INTRAMUSCULAR | Status: AC
  Filled 2020-06-22: qty 2

## 2020-06-22 MED ORDER — GUAIFENESIN-DM 100-10 MG/5ML PO SYRP: 15.0000 mL | ORAL_SOLUTION | ORAL | Status: DC | PRN

## 2020-06-22 MED ORDER — ACETAMINOPHEN 650 MG RE SUPP: 325.0000 mg | RECTAL | Status: DC | PRN

## 2020-06-22 MED ORDER — ALPRAZOLAM 0.5 MG PO TABS: 0.5000 mg | ORAL_TABLET | Freq: Two times a day (BID) | ORAL | Status: DC | PRN

## 2020-06-22 MED ORDER — CEFAZOLIN SODIUM 1 G IJ SOLR
INTRAMUSCULAR | Status: AC
  Filled 2020-06-22: qty 20

## 2020-06-22 MED ORDER — ROCURONIUM BROMIDE 100 MG/10ML IV SOLN
INTRAVENOUS | Status: DC | PRN
  Administered 2020-06-22: 50 mg via INTRAVENOUS
  Administered 2020-06-22: 5 mg via INTRAVENOUS
  Administered 2020-06-22: 15 mg via INTRAVENOUS
  Administered 2020-06-22: 10 mg via INTRAVENOUS
  Administered 2020-06-22: 15 mg via INTRAVENOUS
  Administered 2020-06-22: 20 mg via INTRAVENOUS

## 2020-06-22 MED ORDER — SODIUM CHLORIDE 0.9 % IV SOLN
INTRAVENOUS | Status: DC | PRN
  Administered 2020-06-22 (×3): 20 ug via INTRAVENOUS
  Administered 2020-06-22: 100 ug via INTRAVENOUS
  Administered 2020-06-22: 50 ug via INTRAVENOUS
  Administered 2020-06-22: 20 ug/min via INTRAVENOUS

## 2020-06-22 MED ORDER — HYDRALAZINE HCL 20 MG/ML IJ SOLN: 5.0000 mg | INTRAMUSCULAR | Status: DC | PRN

## 2020-06-22 MED ORDER — DEXMEDETOMIDINE (PRECEDEX) IN NS 20 MCG/5ML (4 MCG/ML) IV SYRINGE
PREFILLED_SYRINGE | INTRAVENOUS | Status: DC | PRN
  Administered 2020-06-22: 4 ug via INTRAVENOUS
  Administered 2020-06-22: 6 ug via INTRAVENOUS
  Administered 2020-06-22: 4 ug via INTRAVENOUS
  Administered 2020-06-22: 6 ug via INTRAVENOUS

## 2020-06-22 MED ORDER — SUGAMMADEX SODIUM 200 MG/2ML IV SOLN
INTRAVENOUS | Status: DC | PRN
  Administered 2020-06-22 (×2): 100 mg via INTRAVENOUS

## 2020-06-22 MED ORDER — CHLORHEXIDINE GLUCONATE 0.12 % MT SOLN
OROMUCOSAL | Status: AC
  Filled 2020-06-22: qty 15

## 2020-06-22 MED ORDER — NITROGLYCERIN IN D5W 200-5 MCG/ML-% IV SOLN: 5.0000 ug/min | INTRAVENOUS | Status: DC

## 2020-06-22 MED ORDER — CHLORHEXIDINE GLUCONATE CLOTH 2 % EX PADS: 6.0000 | MEDICATED_PAD | Freq: Once | CUTANEOUS | Status: DC

## 2020-06-22 MED ORDER — IPRATROPIUM-ALBUTEROL 0.5-2.5 (3) MG/3ML IN SOLN
3.0000 mL | RESPIRATORY_TRACT | Status: DC
  Administered 2020-06-22: 3 mL via RESPIRATORY_TRACT

## 2020-06-22 MED ORDER — OXYCODONE HCL 5 MG/5ML PO SOLN: 5.0000 mg | Freq: Once | ORAL | Status: DC | PRN

## 2020-06-22 MED ORDER — IPRATROPIUM-ALBUTEROL 0.5-2.5 (3) MG/3ML IN SOLN
RESPIRATORY_TRACT | Status: AC
  Filled 2020-06-22: qty 3

## 2020-06-22 MED ORDER — HEPARIN SODIUM (PORCINE) 1000 UNIT/ML IJ SOLN
INTRAMUSCULAR | Status: DC | PRN
  Administered 2020-06-22: 6000 [IU] via INTRAVENOUS
  Administered 2020-06-22: 3000 [IU] via INTRAVENOUS
  Administered 2020-06-22: 2000 [IU] via INTRAVENOUS

## 2020-06-22 MED ORDER — ONDANSETRON HCL 4 MG/2ML IJ SOLN
INTRAMUSCULAR | Status: DC | PRN
  Administered 2020-06-22: 4 mg via INTRAVENOUS

## 2020-06-22 MED ORDER — FAMOTIDINE 20 MG PO TABS
20.0000 mg | ORAL_TABLET | Freq: Once | ORAL | Status: AC
  Administered 2020-06-22: 20 mg via ORAL

## 2020-06-22 MED ORDER — ENOXAPARIN SODIUM 40 MG/0.4ML IJ SOSY
40.0000 mg | PREFILLED_SYRINGE | INTRAMUSCULAR | Status: DC
  Administered 2020-06-23 – 2020-06-25 (×3): 40 mg via SUBCUTANEOUS
  Filled 2020-06-22 (×3): qty 0.4

## 2020-06-22 MED ORDER — LISINOPRIL 20 MG PO TABS
20.0000 mg | ORAL_TABLET | Freq: Every day | ORAL | Status: DC
  Administered 2020-06-23 – 2020-06-25 (×3): 20 mg via ORAL
  Filled 2020-06-22 (×3): qty 1

## 2020-06-22 MED ORDER — DEXMEDETOMIDINE (PRECEDEX) IN NS 20 MCG/5ML (4 MCG/ML) IV SYRINGE
PREFILLED_SYRINGE | INTRAVENOUS | Status: AC
  Filled 2020-06-22: qty 5

## 2020-06-22 MED ORDER — POTASSIUM CHLORIDE CRYS ER 20 MEQ PO TBCR: 20.0000 meq | EXTENDED_RELEASE_TABLET | Freq: Every day | ORAL | Status: DC | PRN

## 2020-06-22 MED ORDER — SODIUM CHLORIDE 0.9 % IV SOLN
INTRAVENOUS | Status: DC | PRN
  Administered 2020-06-22: 500 mL via INTRAMUSCULAR

## 2020-06-22 MED ORDER — CHLORHEXIDINE GLUCONATE CLOTH 2 % EX PADS
6.0000 | MEDICATED_PAD | Freq: Every day | CUTANEOUS | Status: DC
  Administered 2020-06-22 – 2020-06-24 (×3): 6 via TOPICAL

## 2020-06-22 MED ORDER — METOPROLOL TARTRATE 5 MG/5ML IV SOLN: 2.0000 mg | INTRAVENOUS | Status: DC | PRN

## 2020-06-22 MED ORDER — ROSUVASTATIN CALCIUM 10 MG PO TABS
5.0000 mg | ORAL_TABLET | Freq: Every day | ORAL | Status: DC
  Administered 2020-06-23 – 2020-06-25 (×3): 5 mg via ORAL
  Filled 2020-06-22 (×3): qty 1

## 2020-06-22 MED ORDER — ALUM & MAG HYDROXIDE-SIMETH 200-200-20 MG/5ML PO SUSP: 15.0000 mL | ORAL | Status: DC | PRN

## 2020-06-22 MED ORDER — FAMOTIDINE 20 MG PO TABS
ORAL_TABLET | ORAL | Status: AC
  Filled 2020-06-22: qty 1

## 2020-06-22 MED ORDER — IODIXANOL 320 MG/ML IV SOLN
INTRAVENOUS | Status: DC | PRN
  Administered 2020-06-22: 30 mL

## 2020-06-22 MED ORDER — MORPHINE SULFATE (PF) 2 MG/ML IV SOLN: 2.0000 mg | INTRAVENOUS | Status: DC | PRN

## 2020-06-22 MED ORDER — DOPAMINE-DEXTROSE 3.2-5 MG/ML-% IV SOLN: 3.0000 ug/kg/min | INTRAVENOUS | Status: DC

## 2020-06-22 MED ORDER — FAMOTIDINE IN NACL 20-0.9 MG/50ML-% IV SOLN
20.0000 mg | Freq: Two times a day (BID) | INTRAVENOUS | Status: DC
  Administered 2020-06-22 – 2020-06-23 (×3): 20 mg via INTRAVENOUS
  Filled 2020-06-22 (×3): qty 50

## 2020-06-22 MED ORDER — LIDOCAINE HCL (CARDIAC) PF 100 MG/5ML IV SOSY
PREFILLED_SYRINGE | INTRAVENOUS | Status: DC | PRN
  Administered 2020-06-22: 40 mg via INTRAVENOUS
  Administered 2020-06-22: 60 mg via INTRAVENOUS

## 2020-06-22 MED ORDER — SODIUM CHLORIDE FLUSH 0.9 % IV SOLN
INTRAVENOUS | Status: AC
  Filled 2020-06-22: qty 10

## 2020-06-22 MED ORDER — FENTANYL CITRATE (PF) 100 MCG/2ML IJ SOLN
INTRAMUSCULAR | Status: DC | PRN
  Administered 2020-06-22: 50 ug via INTRAVENOUS
  Administered 2020-06-22: 25 ug via INTRAVENOUS
  Administered 2020-06-22: 50 ug via INTRAVENOUS
  Administered 2020-06-22 (×3): 25 ug via INTRAVENOUS

## 2020-06-22 MED ORDER — ACETAMINOPHEN 325 MG PO TABS
325.0000 mg | ORAL_TABLET | ORAL | Status: DC | PRN
  Administered 2020-06-23: 650 mg via ORAL
  Filled 2020-06-22: qty 2

## 2020-06-22 MED ORDER — VISTASEAL 10 ML SINGLE DOSE KIT
PACK | CUTANEOUS | Status: DC | PRN
  Administered 2020-06-22: 10 mL via TOPICAL

## 2020-06-22 MED ORDER — MAGNESIUM SULFATE 2 GM/50ML IV SOLN
2.0000 g | Freq: Every day | INTRAVENOUS | Status: DC | PRN
  Filled 2020-06-22: qty 50

## 2020-06-22 MED ORDER — PROPOFOL 10 MG/ML IV BOLUS
INTRAVENOUS | Status: DC | PRN
  Administered 2020-06-22: 20 mg via INTRAVENOUS
  Administered 2020-06-22: 100 mg via INTRAVENOUS
  Administered 2020-06-22: 20 mg via INTRAVENOUS

## 2020-06-22 MED ORDER — ASPIRIN EC 81 MG PO TBEC
81.0000 mg | DELAYED_RELEASE_TABLET | Freq: Every day | ORAL | Status: DC
  Administered 2020-06-23 – 2020-06-25 (×3): 81 mg via ORAL
  Filled 2020-06-22 (×3): qty 1

## 2020-06-22 MED ORDER — ROCURONIUM BROMIDE 10 MG/ML (PF) SYRINGE
PREFILLED_SYRINGE | INTRAVENOUS | Status: AC
  Filled 2020-06-22: qty 10

## 2020-06-22 MED ORDER — DEXAMETHASONE SODIUM PHOSPHATE 10 MG/ML IJ SOLN
INTRAMUSCULAR | Status: DC | PRN
  Administered 2020-06-22: 5 mg via INTRAVENOUS

## 2020-06-22 MED ORDER — SORBITOL 70 % SOLN
30.0000 mL | Freq: Every day | Status: DC | PRN
  Filled 2020-06-22: qty 30

## 2020-06-22 MED ORDER — SERTRALINE HCL 50 MG PO TABS
100.0000 mg | ORAL_TABLET | Freq: Every day | ORAL | Status: DC
  Administered 2020-06-23 – 2020-06-25 (×3): 100 mg via ORAL
  Filled 2020-06-22 (×3): qty 2

## 2020-06-22 MED ORDER — PHENOL 1.4 % MT LIQD
1.0000 | OROMUCOSAL | Status: DC | PRN
  Filled 2020-06-22: qty 177

## 2020-06-22 MED ORDER — OXYCODONE-ACETAMINOPHEN 5-325 MG PO TABS
1.0000 | ORAL_TABLET | ORAL | Status: DC | PRN
  Administered 2020-06-23 – 2020-06-24 (×2): 1 via ORAL
  Filled 2020-06-22 (×2): qty 1

## 2020-06-22 MED ORDER — CEFAZOLIN SODIUM-DEXTROSE 2-4 GM/100ML-% IV SOLN
2.0000 g | INTRAVENOUS | Status: AC
  Administered 2020-06-22 (×2): 2 g via INTRAVENOUS

## 2020-06-22 MED ORDER — SODIUM CHLORIDE 0.9 % IV SOLN: 500.0000 mL | Freq: Once | INTRAVENOUS | Status: DC | PRN

## 2020-06-22 MED ORDER — ORAL CARE MOUTH RINSE: 15.0000 mL | Freq: Once | OROMUCOSAL | Status: AC

## 2020-06-22 MED ORDER — OXYCODONE HCL 5 MG PO TABS: 5.0000 mg | ORAL_TABLET | Freq: Once | ORAL | Status: DC | PRN

## 2020-06-22 MED ORDER — HALOPERIDOL LACTATE 5 MG/ML IJ SOLN
INTRAMUSCULAR | Status: AC
  Administered 2020-06-22: 2.5 mg via INTRAVENOUS
  Filled 2020-06-22: qty 1

## 2020-06-22 MED ORDER — SODIUM CHLORIDE 0.9 % IV SOLN: INTRAVENOUS | Status: DC

## 2020-06-22 MED ORDER — HEPARIN SODIUM (PORCINE) 5000 UNIT/ML IJ SOLN
INTRAMUSCULAR | Status: AC
  Filled 2020-06-22: qty 1

## 2020-06-22 MED ORDER — CEFAZOLIN SODIUM-DEXTROSE 2-4 GM/100ML-% IV SOLN
2.0000 g | Freq: Three times a day (TID) | INTRAVENOUS | Status: AC
  Administered 2020-06-22 – 2020-06-23 (×2): 2 g via INTRAVENOUS
  Filled 2020-06-22 (×2): qty 100

## 2020-06-22 MED ORDER — FENTANYL CITRATE (PF) 100 MCG/2ML IJ SOLN
INTRAMUSCULAR | Status: AC
  Administered 2020-06-22: 25 ug via INTRAVENOUS
  Filled 2020-06-22: qty 2

## 2020-06-22 MED ORDER — PROPOFOL 500 MG/50ML IV EMUL
INTRAVENOUS | Status: AC
  Filled 2020-06-22: qty 50

## 2020-06-22 MED ORDER — HALOPERIDOL LACTATE 5 MG/ML IJ SOLN: 2.5000 mg | Freq: Once | INTRAMUSCULAR | Status: DC

## 2020-06-22 MED ORDER — CLOPIDOGREL BISULFATE 75 MG PO TABS
75.0000 mg | ORAL_TABLET | Freq: Every day | ORAL | Status: DC
  Administered 2020-06-23 – 2020-06-25 (×3): 75 mg via ORAL
  Filled 2020-06-22 (×3): qty 1

## 2020-06-22 MED ORDER — ORAL CARE MOUTH RINSE
15.0000 mL | Freq: Two times a day (BID) | OROMUCOSAL | Status: DC
  Administered 2020-06-22 – 2020-06-25 (×5): 15 mL via OROMUCOSAL

## 2020-06-22 SURGICAL SUPPLY — 89 items
BAG DECANTER FOR FLEXI CONT (MISCELLANEOUS) ×3 IMPLANT
BALLN LUTONIX DCB 6X100X130 (BALLOONS) ×3
BALLN LUTONIX DCB 6X80X130 (BALLOONS) ×3
BALLN ULTRVRSE 7X100X75 (BALLOONS) ×3
BALLOON LUTONIX DCB 6X100X130 (BALLOONS) ×2 IMPLANT
BALLOON LUTONIX DCB 6X80X130 (BALLOONS) ×2 IMPLANT
BALLOON ULTRVRSE 7X100X75 (BALLOONS) ×2 IMPLANT
BLADE SURG 15 STRL LF DISP TIS (BLADE) ×2 IMPLANT
BLADE SURG 15 STRL SS (BLADE) ×1
BLADE SURG SZ11 CARB STEEL (BLADE) ×3 IMPLANT
BOOT SUTURE AID YELLOW STND (SUTURE) ×6 IMPLANT
BRUSH SCRUB EZ  4% CHG (MISCELLANEOUS) ×1
BRUSH SCRUB EZ 4% CHG (MISCELLANEOUS) ×2 IMPLANT
CANISTER SUCT 1200ML W/VALVE (MISCELLANEOUS) ×3 IMPLANT
CATH ANGIO 5F 80CM MHK1-H (CATHETERS) ×3 IMPLANT
CATH BEACON 5 .035 40 KMP TP (CATHETERS) ×2 IMPLANT
CATH BEACON 5 .035 65 RIM TIP (CATHETERS) ×3 IMPLANT
CATH BEACON 5 .038 40 KMP TP (CATHETERS) ×1
CATH KUMPE SOFT-VU 5FR 65 (CATHETERS) ×3 IMPLANT
CHLORAPREP W/TINT 26 (MISCELLANEOUS) ×3 IMPLANT
COVER WAND RF STERILE (DRAPES) ×3 IMPLANT
DERMABOND ADVANCED (GAUZE/BANDAGES/DRESSINGS) ×2
DERMABOND ADVANCED .7 DNX12 (GAUZE/BANDAGES/DRESSINGS) ×4 IMPLANT
DEVICE TORQUE (MISCELLANEOUS) ×3 IMPLANT
DRAPE C-ARM XRAY 36X54 (DRAPES) ×3 IMPLANT
DRAPE INCISE IOBAN 66X45 STRL (DRAPES) ×3 IMPLANT
DRSG OPSITE POSTOP 4X8 (GAUZE/BANDAGES/DRESSINGS) ×6 IMPLANT
ELECT CAUTERY BLADE 6.4 (BLADE) ×6 IMPLANT
ELECT REM PT RETURN 9FT ADLT (ELECTROSURGICAL) ×6
ELECTRODE REM PT RTRN 9FT ADLT (ELECTROSURGICAL) ×4 IMPLANT
ENSNARE 9-15 (MISCELLANEOUS) ×3 IMPLANT
GLIDECATH 4FR STR (CATHETERS) ×3 IMPLANT
GLIDEWIRE ADV .035X180CM (WIRE) ×6 IMPLANT
GLOVE SURG SYN 7.0 (GLOVE) ×6 IMPLANT
GLOVE SURG UNDER LTX SZ7.5 (GLOVE) ×3 IMPLANT
GOWN STRL REUS W/ TWL LRG LVL3 (GOWN DISPOSABLE) ×2 IMPLANT
GOWN STRL REUS W/ TWL XL LVL3 (GOWN DISPOSABLE) ×4 IMPLANT
GOWN STRL REUS W/TWL LRG LVL3 (GOWN DISPOSABLE) ×1
GOWN STRL REUS W/TWL XL LVL3 (GOWN DISPOSABLE) ×2
HEMOSTAT SURGICEL 2X3 (HEMOSTASIS) ×6 IMPLANT
INTRODUCER 7FR 23CM (INTRODUCER) ×3 IMPLANT
IV NS 500ML (IV SOLUTION) ×1
IV NS 500ML BAXH (IV SOLUTION) ×2 IMPLANT
KIT ENCORE 26 ADVANTAGE (KITS) ×3 IMPLANT
KIT TURNOVER KIT A (KITS) ×3 IMPLANT
LABEL OR SOLS (LABEL) ×3 IMPLANT
LOOP RED MAXI  1X406MM (MISCELLANEOUS) ×2
LOOP VESSEL MAXI 1X406 RED (MISCELLANEOUS) ×4 IMPLANT
LOOP VESSEL MINI 0.8X406 BLUE (MISCELLANEOUS) ×4 IMPLANT
LOOPS BLUE MINI 0.8X406MM (MISCELLANEOUS) ×2
MANIFOLD NEPTUNE II (INSTRUMENTS) ×3 IMPLANT
NDL SAFETY ECLIPSE 18X1.5 (NEEDLE) ×2 IMPLANT
NEEDLE HYPO 18GX1.5 SHARP (NEEDLE) ×1
NS IRRIG 500ML POUR BTL (IV SOLUTION) ×3 IMPLANT
PACK ANGIOGRAPHY (CUSTOM PROCEDURE TRAY) ×3 IMPLANT
PACK BASIN MAJOR ARMC (MISCELLANEOUS) ×3 IMPLANT
PACK UNIVERSAL (MISCELLANEOUS) ×3 IMPLANT
PATCH CAROTID ECM VASC 1X10 (Prosthesis & Implant Heart) ×6 IMPLANT
PENCIL ELECTRO HAND CTR (MISCELLANEOUS) ×3 IMPLANT
SET WALTER ACTIVATION W/DRAPE (SET/KITS/TRAYS/PACK) ×6 IMPLANT
SHEATH BRITE TIP 6FRX11 (SHEATH) ×3 IMPLANT
SHEATH BRITE TIP 7FRX11 (SHEATH) ×3 IMPLANT
SHEATH BRITE TIP 7FRX5.5 (SHEATH) ×3 IMPLANT
STENT LIFESTAR 8X80X80 (Permanent Stent) ×3 IMPLANT
STENT LIFESTREAM 6X37X80 (Permanent Stent) ×6 IMPLANT
STENT LIFESTREAM 7X26X80 (Permanent Stent) ×3 IMPLANT
STENT LIFESTREAM 7X58X80 (Permanent Stent) ×6 IMPLANT
STENT VIABAHN 7X7.5X120 (Permanent Stent) ×3 IMPLANT
SUT MNCRL 4-0 (SUTURE) ×1
SUT MNCRL 4-0 27XMFL (SUTURE) ×2
SUT PROLENE 5 0 RB 1 DA (SUTURE) ×9 IMPLANT
SUT PROLENE 6 0 BV (SUTURE) ×21 IMPLANT
SUT PROLENE 7 0 BV 1 (SUTURE) ×12 IMPLANT
SUT SILK 2 0 (SUTURE) ×1
SUT SILK 2-0 18XBRD TIE 12 (SUTURE) ×2 IMPLANT
SUT SILK 3 0 (SUTURE) ×1
SUT SILK 3-0 18XBRD TIE 12 (SUTURE) ×2 IMPLANT
SUT SILK 4 0 (SUTURE) ×1
SUT SILK 4-0 18XBRD TIE 12 (SUTURE) ×2 IMPLANT
SUT VIC AB 2-0 CT1 27 (SUTURE) ×2
SUT VIC AB 2-0 CT1 TAPERPNT 27 (SUTURE) ×4 IMPLANT
SUT VIC AB 3-0 SH 27 (SUTURE) ×1
SUT VIC AB 3-0 SH 27X BRD (SUTURE) ×2 IMPLANT
SUT VICRYL+ 3-0 36IN CT-1 (SUTURE) ×6 IMPLANT
SUTURE MNCRL 4-0 27XMF (SUTURE) ×2 IMPLANT
SYR 20ML LL LF (SYRINGE) ×3 IMPLANT
SYR 5ML LL (SYRINGE) ×3 IMPLANT
TRAY FOLEY MTR SLVR 16FR STAT (SET/KITS/TRAYS/PACK) ×3 IMPLANT
WIRE G 018X200 V18 (WIRE) ×3 IMPLANT

## 2020-06-22 NOTE — H&P (Signed)
[facility] Admission History & Physical  MRN : [id number]  Joan [name] is a 70 y.o. (1950/11/23) female who presents with chief complaint of No chief complaint on file. Marland Kitchen  History of Present Illness: patient presents for her revascularization today. No new issues. Rest pain and disabling claudication symptoms, R>L  Current Facility-Administered Medications  Medication Dose Route Frequency Provider Last Rate Last Admin  . ceFAZolin (ANCEF) IVPB 2g/100 mL premix  2 g Intravenous On Call to OR Kris Hartmann, NP      . chlorhexidine (PERIDEX) 0.12 % solution           . Chlorhexidine Gluconate Cloth 2 % PADS 6 each  6 each Topical Once Kris Hartmann, NP       And  . Chlorhexidine Gluconate Cloth 2 % PADS 6 each  6 each Topical Once Kris Hartmann, NP      . famotidine (PEPCID) 20 MG tablet           . lactated ringers infusion   Intravenous Continuous Gunnar Fusi, MD 100 mL/hr at 06/22/20 0824 Continued from Pre-op at 06/22/20 0824  . sodium chloride flush 0.9 % injection             Past Medical History:  Diagnosis Date  . Angina at rest [facility])   . Anxiety   . Arthritis   . Breast cancer, right breast ([place])    s/p lumpectomy and adjuvant XRT  . Bronchitis   . Cancer ([place])   . Claudication, intermittent ([place])   . Depression   . Dyspnea   . Environmental and seasonal allergies   . GERD (gastroesophageal reflux disease)    with spicy food  . Hepatitis C antibody test positive   . Hypertension   . Peripheral vascular disease ([place])   . Personal history of radiation therapy 2002   RIGHT lumpectomy  . TIA (transient ischemic attack)    no residual. Many years ago    Past Surgical History:  Procedure Laterality Date  . APPENDECTOMY    . BREAST EXCISIONAL BIOPSY Right    11/1999  . BREAST LUMPECTOMY Right 2002   RIGHT lumpectomy w/ radiation 2002  . BREAST SURGERY    . CHOLECYSTECTOMY    . LOWER EXTREMITY ANGIOGRAPHY Left 11/14/2017    Procedure: LOWER EXTREMITY ANGIOGRAPHY;  Surgeon: Algernon Huxley, MD;  Location: [place] [facility];  Service: Cardiovascular;  Laterality: Left;  . LOWER EXTREMITY ANGIOGRAPHY Right 04/21/2020   Procedure: LOWER EXTREMITY ANGIOGRAPHY;  Surgeon: Algernon Huxley, MD;  Location: [place] [facility];  Service: Cardiovascular;  Laterality: Right;     Social History   Tobacco Use  . Smoking status: Current Every Day Smoker    Packs/day: 0.50    Years: 50.00    Pack years: 25.00    Types: Cigarettes  . Smokeless tobacco: Never Used  . Tobacco comment: trying to quit on her own  Vaping Use  . Vaping Use: Never used  Substance Use Topics  . Alcohol use: Not Currently    Comment: Beer heavy everyday in past - last 10 years ago  . Drug use: No     Family History  Problem Relation Age of Onset  . Breast cancer Mother 81  . Breast cancer Maternal Aunt   . Breast cancer Maternal Grandmother   . Breast cancer Sister 27    Allergies  Allergen Reactions  . Seasonal Ic [Cholestatin]  Seasonal allergies     REVIEW OF SYSTEMS (Negative unless checked)  Constitutional: [] Weight loss  [] Fever  [] Chills Cardiac: [] Chest pain   [] Chest pressure   [] Palpitations   [] Shortness of breath when laying flat   [] Shortness of breath at rest   [] Shortness of breath with exertion. Vascular:  [x] Pain in legs with walking   [] Pain in legs at rest   [] Pain in legs when laying flat   [x] Claudication   [] Pain in feet when walking  [x] Pain in feet at rest  [] Pain in feet when laying flat   [] History of DVT   [] Phlebitis   [] Swelling in legs   [] Varicose veins   [] Non-healing ulcers Pulmonary:   [] Uses home oxygen   [] Productive cough   [] Hemoptysis   [] Wheeze  [] COPD   [] Asthma Neurologic:  [] Dizziness  [] Blackouts   [] Seizures   [] History of stroke   [x] History of TIA  [] Aphasia   [] Temporary blindness   [] Dysphagia   [] Weakness or numbness in arms   [] Weakness or numbness in legs Musculoskeletal:   [x] Arthritis   [] Joint swelling   [] Joint pain   [] Low back pain Hematologic:  [] Easy bruising  [] Easy bleeding   [] Hypercoagulable state   [] Anemic  [] Hepatitis Gastrointestinal:  [] Blood in stool   [] Vomiting blood  [x] Gastroesophageal reflux/heartburn   [] Difficulty swallowing. Genitourinary:  [] Chronic kidney disease   [] Difficult urination  [] Frequent urination  [] Burning with urination   [] Blood in urine Skin:  [] Rashes   [] Ulcers   [] Wounds Psychological:  [x] History of anxiety   [x]  History of major depression.  Physical Examination  Vitals:   06/22/20 0719  BP: (!) 156/98  Pulse: 71  Resp: 20  Temp: 98.4 F (36.9 C)  TempSrc: Oral  SpO2: 99%  Weight: 63.5 kg  Height: 5\' 4"  (1.626 m)   Body mass index is 24.03 kg/m. Gen: WD/WN, NAD Head: [place]/AT, No temporalis wasting.  Ear/Nose/Throat: Hearing grossly intact, nares w/o erythema or drainage, oropharynx w/o Erythema/Exudate,  Eyes: Conjunctiva clear, sclera non-icteric Neck: Trachea midline.  No JVD.  Pulmonary:  Good air movement, respirations not labored, no use of accessory muscles.  Cardiac: RRR, normal S1, S2. Vascular:  Vessel Right Left  Radial Palpable Palpable                          PT Not Palpable Not Palpable  DP Not Palpable Not Palpable   Gastrointestinal: soft, non-tender/non-distended. No guarding/reflex.  Musculoskeletal: M/S 5/5 throughout.  Extremities without ischemic changes.  No deformity or atrophy.  Neurologic: Sensation grossly intact in extremities.  Symmetrical.  Speech is fluent. Motor exam as listed above. Psychiatric: Judgment intact, Mood & affect appropriate for pt's clinical situation. Dermatologic: No rashes or ulcers noted.  No cellulitis or open wounds.      CBC Lab Results  Component Value Date   WBC 9.2 06/21/2020   HGB 13.9 06/21/2020   HCT 42.1 06/21/2020   MCV 89.2 06/21/2020   PLT 296 06/21/2020    BMET    Component Value Date/Time   NA 137 06/21/2020  0853   K 4.2 06/21/2020 0853   CL 105 06/21/2020 0853   CO2 23 06/21/2020 0853   GLUCOSE 84 06/21/2020 0853   BUN 14 06/21/2020 0853   CREATININE 0.72 06/21/2020 0853   CALCIUM 9.1 06/21/2020 0853   GFRNONAA >60 06/21/2020 0853   GFRAA >60 11/12/2017 0948   Estimated Creatinine Clearance: 58.9 mL/min (by C-G formula based  on SCr of 0.72 mg/dL).  COAG Lab Results  Component Value Date   INR 1.0 06/21/2020   INR 0.98 07/14/2013    Radiology No results found.   Assessment/Plan 1. PAD with rest pain, bilateral. For hybrid revascularization today. Risks and benefits are discussed with patient and she is agreeable to proceed. 2. HTN. Stable on outpatient medications and blood pressure control important in reducing the progression of atherosclerotic disease. On appropriate oral medications. 3. Tobacco abuse. Patient understands this is a primary cause of her vascular disease and cessation would be of benefit.   [name], MD  06/22/2020 8:24 AM

## 2020-06-22 NOTE — Op Note (Addendum)
OPERATIVE NOTE   PROCEDURE: 1. Left common femoral, profunda femoris, and superficial femoral artery endarterectomies 2.   Right common femoral, profunda femoris, and superficial femoral artery endarterectomies 3.  Catheter placement into the aorta from bilateral femoral approaches 4.  Aortogram and bilateral iliofemoral angiograms 5.  Kissing balloon stent placements bilateral common iliac arteries and the distal aorta with a 7 mm diameter by 26 mm length lifestream stent on the right 7 mm diameter by 58 mm length lifestream stent on the left 6.  Additional stent placement x3 to the right common iliac artery and external iliac artery with two 6 mm diameter by 38 mm length lifestream stents and an 8 mm diameter by 8 cm length life star stent 7.  Additional stent placement x2 to the left common iliac artery with a 7 mm diameter by 58 mm length lifestream stent into the left external iliac artery with a 7 mm diameter by 75 mm length Viabahn stent   PRE-OPERATIVE DIAGNOSIS: 1.Atherosclerotic occlusive disease bilateral lower extremities with rest Joan [name]   POST-OPERATIVE DIAGNOSIS: Same  SURGEON: Joan Pain, MD  CO-surgeon:Joan Schnier, MD  ANESTHESIA: general  ESTIMATED BLOOD LOSS: 450 cc  FINDING(S): 1. significant plaque in bilateral common femoral, profunda femoris, and superficial femoral arteries 2.  Severe aortoiliac disease with stenosis in the right external iliac artery and common iliac artery that was more significant than seen on previous angiogram and occlusion of the left common and external iliac arteries.  SPECIMEN(S): Bilateral common femoral, profunda femoris, and superficial femoral artery plaque.  INDICATIONS:  Patient presents with disabling claudication symptoms and rest Joan [name] symptoms lower extremities.  Bilateral femoral endarterectomies as well as iliac interventions are planned to try to improve perfusion. The risks and benefits as well as alternative  therapies including intervention were reviewed in detail all questions were answered the patient agrees to proceed with surgery.  DESCRIPTION: After obtaining full informed written consent, the patient was brought back to the operating room and placed supine upon the operating table. The patient received IV antibiotics prior to induction. After obtaining adequate anesthesia, the patient was prepped and draped in the standard fashion appropriate time out is called.   With myself working on the left and Dr. Delana Joan [name] working on the right we began by dissecting out the femoral arteries on each side. Vertical incisions were created overlying both femoral arteries. The common femoral artery proximally, and superficial femoral artery, and primary profunda femoris artery branches were encircled with vessel loops and prepared for control. Both femoral arteries were found to have significant plaque from the common femoral artery into the profunda and superficial femoral arteries.   6000 units of heparin was given and allowed circulate for 5 minutes.  Additional heparin was given later in the procedure as needed  Attention is then turned to the left femoral artery. An arteriotomy is made with 11 blade and extended with Potts scissors in the common femoral artery and carried down onto the first 3-4 cm of the superficial femoral artery. An endarterectomy was then performed. The [facility] was used to create a plane. The proximal endpoint was cut flush with tenotomy scissors and then a hemostat was used to pull out plaque and more proximal to the endpoint.. This was in the proximal common femoral artery. An eversion endarterectomy was then performed for the first 2-3 cm of the profunda femoris artery. Good backbleeding was then seen. The distal endpoint of the superficial femoral artery endarterectomy was created with gentle traction  and the distal endpoint was fairly clean after an extensive endarterectomy  down to just above the previously placed stents several cm down the SFA. The Cormatrix patcth is then selected and prepared for a patch angioplasty.  It is cut and beveled and started at the proximal endpoint with a 6-0 Prolene suture.  Approximately one half of the suture line is run medially and laterally and the distal end point was cut and bevelled to match the arteriotomy.  A second 6-0 Prolene was started at the distal end point and run to the mid portion to complete the arteriotomy leaving a gap in the midportion to be active place and sheath for iliac intervention.  The vessel was flushed prior to release of control and completion of the anastomosis.  At this point, flow was established first to the profunda femoris artery and then to the superficial femoral artery. Easily palpable pulses are noted well beyond the anastomosis and both arteries.  The right femoral artery is then addressed. Arteriotomy is made in the common femoral artery and extended down into the first 3 to 4 cm of the profunda femoris artery. Similarly, an endarterectomy was performed with the [facility]. The proximal endpoint was cut flush with tenotomy scissors in the proximal common femoral artery.  The initial portion of the occluded right superficial femoral artery was addressed and treated with an eversion endarterectomy and this was performed with a hemostat and gentle traction. The arteriotomy was carried down onto the profunda femoris artery and the endarterectomy was continued to this point.  There was extensive plaque in the proximal portion of the profunda femoris artery that required a difficult endarterectomy down to the primary branches of the profunda femoris artery.  The distal endpoint was cut with tenotomy scissors and then tacked down with three 7-0 Prolene sutures. The Cormatrix extracellular patch was then brought onto the field.  It is cut and beveled and started at the proximal endpoint with a 6-0 Prolene  suture.  Approximately one half of the suture line is run medially and laterally and the distal end point was cut and bevelled to match the arteriotomy.  A second 6-0 Prolene was started at the distal end point and run to the mid portion to complete the arteriotomy.   Flushing maneuvers were performed and flow was reestablished to the femoral vessels. Excellent pulses noted in the right profunda femoris artery below the femoral anastomosis.  We then turned our attention to the iliac interventions.  Dr. Delana Joan [name] accessed CorMatrix patch with a Seldinger needle and placed a 6 French sheath in the right femoral artery.  I placed a 7 French sheath through the gap in the left femoral artery.  I was able to cross the occlusion ready left iliac arteries but was around a previous stent in the left common iliac artery which was suboptimal for intervention.  Dr. Delana Joan [name] used a rim catheter and took the top of the left common iliac stent right side and a Glidewire down to the left distal common and external iliac artery.  I then snared this and brought extracorporeally.  A Kumpe catheter was then placed through the left femoral sheath into the right common iliac artery and then flipped up into the aorta.  This was then replaced with an advantage wire and advantage wire was placed on the right.  Imaging through both femoral sheaths showed greater than 50% right common iliac artery that looked worse than seen on previous imaging.  The right external iliac  artery had about a 70 to 75% stenosis.  Left common and external iliac arteries were occluded.  We saw the top of our patch repairs which were widely patent.  We then selected 7 mm diameter by 26 mm length lifestream stent on the right and a 7 mm diameter by 58 mm length lifestream stent on the left and deployed the simultaneously almost a centimeter up into the aorta to rebuild the distal aorta and proximal common iliac arteries in a kissing balloon fashion.  On the right,  Dr. Delana Joan [name] then had 3 additional stents to get down to the top of the femoral head taking care to avoid harming the hypogastric artery on the right side.  This required 2 additional 6 mm diameter by 38 mm length lifestream stents to get to the top of the iliac bifurcation and then an 8 mm diameter by 8 cm length life star stent down to the top of the femoral head bridging each stent.  These were postdilated with 7 mm balloons in the common iliac artery and 6 mm diameter Lutonix drug-coated balloon in the right external iliac artery.  We then turned our attention to the left iliac artery.  An additional 7 mm diameter by 58 mm length lifestream stent was taken down in the left common iliac artery to the proximal external iliac artery.  We then exchanged for a 0.018 wire and used a 7 mm diameter by 75 mm length Viabahn stent to come to the left femoral head.  The left hypogastric artery was diminutive.  The Viabahn stent was postdilated with a 6 mm balloon.  Completion imaging showed excellent flow through both sets of iliac arteries and the distal aorta with less than 10% residual stenosis after interventions.  The right femoral sheath was removed and a 5-0 Prolene pursestring suture was placed in the arteriotomy site. Attention was turned to the left side after the sheath was removed.   The vessel was flushed prior to release of control and completion of the anastomosis.  At this point, flow was established first to the profunda femoris artery and then to the superficial femoral artery. Easily palpable pulses are noted well beyond the anastomosis and both arteries.  Surgicel and Evicel topical hemostatic agents were placed in the femoral incisions and hemostasis was complete. The femoral incisions were then closed in a layered fashion with 2 layers of 2-0 Vicryl, 2 layers of 3-0 Vicryl, and 4-0 Monocryl for the skin closure. Dermabond and sterile dressing were then placed over all incisions.  The patient was  then awakened from anesthesia and taken to the recovery room in stable condition having tolerated the procedure well.  COMPLICATIONS: None  CONDITION: Stable     Joan Joan [name] 06/22/2020 2:05 PM  This note was created with Dragon Medical transcription system. Any errors in dictation are purely unintentional.

## 2020-06-22 NOTE — Anesthesia Preprocedure Evaluation (Signed)
Anesthesia Evaluation  Patient identified by MRN, date of birth, ID band Patient awake    Reviewed: Allergy & Precautions, NPO status , Patient's Chart, lab work & pertinent test results  History of Anesthesia Complications Negative for: history of anesthetic complications  Airway Mallampati: II  TM Distance: >3 FB Neck ROM: Full    Dental  (+) Edentulous Upper, Edentulous Lower   Pulmonary neg sleep apnea, neg COPD, Current Smoker and Patient abstained from smoking.,    breath sounds clear to auscultation- rhonchi (-) wheezing      Cardiovascular hypertension, Pt. on medications + Peripheral Vascular Disease  (-) CAD, (-) Past MI, (-) Cardiac Stents and (-) CABG  Rhythm:Regular Rate:Normal - Systolic murmurs and - Diastolic murmurs    Neuro/Psych PSYCHIATRIC DISORDERS Anxiety Depression TIA   GI/Hepatic Neg liver ROS, GERD  ,  Endo/Other  negative endocrine ROSneg diabetes  Renal/GU negative Renal ROS     Musculoskeletal  (+) Arthritis ,   Abdominal (+) - obese,   Peds  Hematology negative hematology ROS (+)   Anesthesia Other Findings Past Medical History: No date: Angina at rest [facility]) No date: Anxiety No date: Arthritis No date: Breast cancer, right breast (HCC)     Comment:  s/p lumpectomy and adjuvant XRT No date: Bronchitis No date: Cancer ([place]) No date: Claudication, intermittent (HCC) No date: Depression No date: Dyspnea No date: Environmental and seasonal allergies No date: GERD (gastroesophageal reflux disease)     Comment:  with spicy food No date: Hepatitis C antibody test positive No date: Hypertension No date: Peripheral vascular disease ([place]) 2002: Personal history of radiation therapy     Comment:  RIGHT lumpectomy No date: TIA (transient ischemic attack)     Comment:  no residual. Many years ago   Reproductive/Obstetrics                             Anesthesia  Physical Anesthesia Plan  ASA: III  Anesthesia Plan: General   Post-op Pain Management:    Induction: Intravenous  PONV Risk Score and Plan: 1 and Ondansetron and Dexamethasone  Airway Management Planned: Oral ETT  Additional Equipment: Arterial line  Intra-op Plan:   Post-operative Plan: Extubation in OR  Informed Consent: I have reviewed the patients History and Physical, chart, labs and discussed the procedure including the risks, benefits and alternatives for the proposed anesthesia with the patient or authorized representative who has indicated his/her understanding and acceptance.     Dental advisory given  Plan Discussed with: CRNA and Anesthesiologist  Anesthesia Plan Comments:         Anesthesia Quick Evaluation

## 2020-06-22 NOTE — Anesthesia Postprocedure Evaluation (Signed)
Anesthesia Post Note  Patient: ABIGIAL [place]  Procedure(s) Performed: ENDARTERECTOMY FEMORAL (Bilateral ) INSERTION OF ILIAC STENT (Bilateral ) APPLICATION OF CELL SAVER (N/A )  Patient location during evaluation: PACU Anesthesia Type: General Level of consciousness: awake and alert and oriented Pain management: pain level controlled Vital Signs Assessment: post-procedure vital signs reviewed and stable Respiratory status: spontaneous breathing, nonlabored ventilation and respiratory function stable Cardiovascular status: blood pressure returned to baseline and stable Postop Assessment: no signs of nausea or vomiting Anesthetic complications: no   No complications documented.   Last Vitals:  Vitals:   06/22/20 1415 06/22/20 1430  BP: (!) 94/57 (!) 144/69  Pulse: 64 72  Resp: (!) 22 (!) 22  Temp:    SpO2: 100% 100%    Last Pain:  Vitals:   06/22/20 1415  TempSrc:   PainSc: Asleep                 Anita Laguna

## 2020-06-22 NOTE — Transfer of Care (Signed)
Immediate Anesthesia Transfer of Care Note  Patient: Joan [name]  Procedure(s) Performed: ENDARTERECTOMY FEMORAL (Bilateral ) INSERTION OF ILIAC STENT (Bilateral ) APPLICATION OF CELL SAVER (N/A )  Patient Location: PACU  Anesthesia Type:General  Level of Consciousness: sedated  Airway & Oxygen Therapy: Patient Spontanous Breathing and Patient connected to face mask oxygen  Post-op Assessment: Report given to RN and Post -op Vital signs reviewed and stable  Post vital signs: Reviewed  Last Vitals:  Vitals Value Taken Time  BP    Temp    Pulse    Resp    SpO2      Last Pain:  Vitals:   06/22/20 0719  TempSrc: Oral  PainSc: 0-No pain         Complications: No complications documented.

## 2020-06-22 NOTE — Progress Notes (Signed)
Dr. Lucky Cowboy came to look at the left femoral incision site which is swollen and stated that he was fine with the site so I will continue to observe the patient.

## 2020-06-22 NOTE — Progress Notes (Signed)
Patient is confused, trying to climb out of bed while calling for her brother Delfino Lovett and her partner. She is stating she wants to go home. I called Dr. Amie Critchley and he stated that it is probably due to post op delirium. He stated to give her 2.5 of Haldol and then keep 2.5 mg of Haldol for ICU for 1 time use.

## 2020-06-22 NOTE — Op Note (Signed)
OPERATIVE NOTE   PROCEDURE: 1. Bilateral common femoral, superficial femoral and profunda femoris endarterectomy with Cormatrix patch angioplasty. 2. Introduction catheter into the aorta bilateral femoral artery approach. 3. Placement of balloon expandable stents bilateral common iliac arteries using the kissing balloon technique. 4. Placement of self-expanding stents bilateral external iliac arteries.   PRE-OPERATIVE DIAGNOSIS: Atherosclerotic occlusive disease bilateral lower extremities with lifestyle limiting claudication and rest pain symptoms; hypertension; diabetes mellitus  POST-OPERATIVE DIAGNOSIS: Same  CO-SURGEON: Joan Cabal, MD and Joan [name], M.D.  ASSISTANT(S): None  ANESTHESIA: general  ESTIMATED BLOOD LOSS: 450 cc  FINDING(S): 1. Profound calcific plaque noted bilaterally extending past the initial bifurcation of the profunda femoris arteries as well as down the extensive length of the SFA in association with greater than 70% stenosis of the right common and external iliac arteries and occlusion of the left common and external iliac arteries  SPECIMEN(S):  Calcific plaque from the common femoral, superficial femoral and the profunda femoris arteries bilaterally  INDICATIONS:   Joan [name] 70 y.o. y.o.female who presents with complaints of lifestyle limiting claudication and pain continuously in the feet bilaterally. The patient has documented severe atherosclerotic occlusive disease and has undergone multiple minimally invasive treatments in the past. However, at this point his primary area of stricture stenosis resides in the common femoral and origins of the superficial femoral and profunda femoris extending into these arteries and therefore this is not amenable to intervention and he is now undergoing open endarterectomy. The risks and benefits of been reviewed with the patient, all questions have answered; alternative therapies have been reviewed as  well and the patient has agreed to proceed with surgical open repair.  DESCRIPTION: After obtaining full informed written consent, the patient was brought back to the operating room and placed supine upon the operating table.  The patient received IV antibiotics prior to induction.  After obtaining adequate anesthesia, the patient was prepped and draped in the standard fashion for: bilateral femoral exposure.  Co-surgeons are required because this is a bilateral procedure with work being performed simultaneously from both the right femoral and left femoral approach.  This also expedite the procedure making a shorter operative time reducing complications and improving patient safety.  Attention was turned to the bilateral groin with Joan [name] working on the right and myself working on the left of the patient.  Vertical  incisions were made over the common femoral artery and dissected down to the common femoral artery with electrocautery.  I dissected out the common femoral artery from the distal external iliac artery (identified by the superficial circumflex vessels) down to the femoral bifurcation.  On initial inspection, the common femoral artery was: Densely calcified and there was no palpable pulse noted bilaterally.    Subsequently the dissection was continued to include all circumflex branches and the profunda femoral artery and superficial femoral artery. The superficial femoral artery was dissected circumferentially for a distance of approximately 3-4 cm and the profunda femoris was dissected circumferentially out to the fourth order branches individual vessel loops were placed around each branch. Both of the groins were treated simultaneously as described above. Control of all branches was obtained with vessel loops.  A softer area in the distal external iliac artery amendable to clamping was identified.    The patient was given 5000 units of Heparin intravenously, which was a therapeutic bolus.   After  waiting 3 minutes, the distal left external iliac artery first.  It was clamped  and next we placed all circumflex branches, and the profunda and superficial femoral arteries under tension.  Arteriotomy was made in the common femoral artery with a 11-blade and extended it with a Potts scissor proximally and distally extending the distal end down the SFA for approximately 3 cm.   Endarterectomy was then performed under direct visualization using a freer elevator and a right angle from the mid common femoral extending up both proximally and distally. Proximally the endarterectomy was brought up to the level of the clamp where a clean edge was obtained. Distally the endarterectomy was carried down to a soft spot in the SFA where a feathered edge would was obtained.    The profunda femoris was treated with an eversion technique extending endarterectomy approximately 2 cm distally again obtaining a featheredge on the left.   At this point, we fashioned a core matrix patch for the geometry of the arteriotomy.  The patch was sewn to the artery with 2 running stitches of 6-0 Prolene, running from each end.  A small gap was left in the lateral suture line to allow for placement of a sheath.  And attention was turned to the right groin.  In similar fashion vascular control was achieved with Silastic Vesseloops as well as a profunda clamp across the distal external iliac artery on the right.  Arteriotomy was made and this time brought down approximately 2 cm onto the profunda femoris.  Endarterectomy of the common femoral and profunda was performed with a freer elevator under direct visualization.  The superficial femoral artery was treated with an eversion technique.  7-0 Prolene sutures were used to tack the intimal edge in the profunda.  CorMatrix patch was then brought to the field trimmed and applied to the arteriotomy using two 6-0 Prolene sutures in a running fashion 1 from each end.  Attention was then turned  to the aortoiliac reconstruction.  On the right a Seldinger needle was used to access the center of the patch in its midportion.  An advantage wire was negotiated up into the aorta where hand-injection contrast verified intraluminal placement.  With Joan [name] working on the left a 7 French sheath was inserted through the gap left in the suture line of the patch.  We then were working simultaneously from both the right and left side with a variety of different wires and catheters ultimately we successfully were able to advance an advantage wire up and over the aortic bifurcation through the occluded left common iliac and then successfully advanced a Kumpe catheter.  At this point Joan [name] was able to snare the wire and pull it extracorporeally.  The Kumpe catheter was then advanced up the left side so that it was positioned in the proximal right common iliac and the wire removed.  The Kumpe was then flipped so that its tip was up into the aorta and a wire advanced in a retrograde fashion through the catheter.  A second advantage wire was then advanced up the right side.  A 7 mm x 26 mm lifestream stent was then advanced up the right side and a 7 mm x 58 mm lifestream stent was advanced up the left side.  They were positioned with the leading edge approximately 1 cm above the bifurcation and deployed simultaneous lesion then kissing balloon technique.  We then addressed the right where I placed 2 additional 6 mm x 37 mm lifestream stents to complete the reconstruction of the right common iliac artery.  These were postdilated  using a 7 mm x 100 mm balloon inflated to 12 atm.  Magnified imaging in the oblique fascia was then performed and an 8 mm x 80 life star stent was deployed overlapping the lifestream stent and extending the stent down to the level of the ileal inguinal ligament the stent was then postdilated using a 6 mm x 100 mm Lutonix drug-eluting balloon inflated to 10 atm for 1 minute.  Follow-up imaging now  demonstrated that there is less than 10% residual stenosis throughout the right aortoiliac system and attention was turned to completing the left-sided reconstruction.  RAO imaging of the left side was then performed a second 7 x 58 lifestream stent was then deployed overlapping the initial stent by 1 cm.  Next after further imaging a 7 mm x 75 mm Viabahn was deployed again with the distal edge down to the level of the ileal inguinal ligament.  This was postdilated with a 6 mm balloon that was used for post dilating the right external iliac.  Inflation was to 12 atm for approximately 1 minute.  Follow-up imaging now demonstrated successful reconstruction of the left-sided aortoiliac system with less than 10% residual stenosis and we elected to complete the patch repair of the femorals.  A 5-0 Prolene suture was used to secure the right Pinnacle sheath.  After flushing the left side the lateral suture line was completed.  Prior to completing the patch angioplasties, the profunda femoral artery was flushed as was the superficial femoral artery. The system was then forward flushed. The endarterectomy site was then irrigated copiously with heparinized saline. The patch angioplasty was completed in the usual fashion.  Flow was then reestablished first to the profunda femoris and then the superficial femoral artery. Any gaps or bleeding sites in the suture line were easily controlled with a 6-0 Prolene suture. Doppler is then delivered onto the field and the SFA as well as the profunda femoris arteries were interrogated and found to have triphasic Doppler signals.  Both right and left groins were then irrigated copiously with sterile saline and subsequently Evicel and Surgicel were placed in the wound. The incision was repaired with a double layer of 2-0 Vicryl, a double layer of 3-0 Vicryl, and a layer of 4-0 Monocryl in a subcuticular fashion.  The skin was cleaned, dried, and reinforced with  Dermabond.  COMPLICATIONS: None  CONDITION: Joan [name], M.D. [facility] Office: [phone]  06/22/2020, 1:51 PM

## 2020-06-22 NOTE — Progress Notes (Signed)
ICU cannot take this patient until 1915 according to the charge nurse.I will put the patient on hold until that time.

## 2020-06-22 NOTE — Anesthesia Procedure Notes (Signed)
Arterial Line Insertion Start/End6/01/2020 8:40 AM Performed by: Emmie Niemann, MD, Rolla Plate, CRNA, CRNA  Patient location: OR. Preanesthetic checklist: patient identified, IV checked, site marked, risks and benefits discussed, surgical consent, monitors and equipment checked, pre-op evaluation, timeout performed and anesthesia consent Patient sedated Left, radial was placed Catheter size: 20 Fr Hand hygiene performed , maximum sterile barriers used  and Seldinger technique used Allen's test indicative of satisfactory collateral circulation Attempts: 1 Procedure performed without using ultrasound guided technique. Following insertion, dressing applied and Biopatch. Post procedure assessment: normal and unchanged  Patient tolerated the procedure well with no immediate complications.

## 2020-06-22 NOTE — Anesthesia Procedure Notes (Signed)
Procedure Name: Intubation Date/Time: 06/22/2020 8:35 AM Performed by: Emmie Niemann, MD Pre-anesthesia Checklist: Patient identified, Patient being monitored, Timeout performed, Emergency Drugs available and Suction available Patient Re-evaluated:Patient Re-evaluated prior to induction Oxygen Delivery Method: Circle system utilized Preoxygenation: Pre-oxygenation with 100% oxygen Induction Type: IV induction Ventilation: Mask ventilation without difficulty Laryngoscope Size: Mac and 3 Grade View: Grade I Tube type: Oral Tube size: 7.0 mm Number of attempts: 1 Airway Equipment and Method: Stylet Placement Confirmation: ETT inserted through vocal cords under direct vision,  positive ETCO2 and breath sounds checked- equal and bilateral Secured at: 21 cm Tube secured with: Tape Dental Injury: Teeth and Oropharynx as per pre-operative assessment

## 2020-06-23 ENCOUNTER — Encounter: Payer: Self-pay | Admitting: Vascular Surgery

## 2020-06-23 LAB — SURGICAL PATHOLOGY

## 2020-06-23 LAB — CBC
HCT: 34.7 % — ABNORMAL LOW (ref 36.0–46.0)
HCT: 36.5 % (ref 36.0–46.0)
Hemoglobin: 11.6 g/dL — ABNORMAL LOW (ref 12.0–15.0)
Hemoglobin: 12.2 g/dL (ref 12.0–15.0)
MCH: 30.4 pg (ref 26.0–34.0)
MCH: 30.5 pg (ref 26.0–34.0)
MCHC: 33.4 g/dL (ref 30.0–36.0)
MCHC: 33.4 g/dL (ref 30.0–36.0)
MCV: 90.8 fL (ref 80.0–100.0)
MCV: 91.3 fL (ref 80.0–100.0)
Platelets: 216 10*3/uL (ref 150–400)
Platelets: 229 10*3/uL (ref 150–400)
RBC: 3.82 MIL/uL — ABNORMAL LOW (ref 3.87–5.11)
RBC: 4 MIL/uL (ref 3.87–5.11)
RDW: 13.9 % (ref 11.5–15.5)
RDW: 14 % (ref 11.5–15.5)
WBC: 13.2 10*3/uL — ABNORMAL HIGH (ref 4.0–10.5)
WBC: 13.4 10*3/uL — ABNORMAL HIGH (ref 4.0–10.5)
nRBC: 0 % (ref 0.0–0.2)
nRBC: 0 % (ref 0.0–0.2)

## 2020-06-23 LAB — BASIC METABOLIC PANEL
Anion gap: 7 (ref 5–15)
BUN: 10 mg/dL (ref 8–23)
CO2: 23 mmol/L (ref 22–32)
Calcium: 7.7 mg/dL — ABNORMAL LOW (ref 8.9–10.3)
Chloride: 106 mmol/L (ref 98–111)
Creatinine, Ser: 0.56 mg/dL (ref 0.44–1.00)
GFR, Estimated: >60 mL/min (ref >60)
Glucose, Bld: 119 mg/dL — ABNORMAL HIGH (ref 70–99)
Potassium: 4.8 mmol/L (ref 3.5–5.1)
Sodium: 136 mmol/L (ref 135–145)

## 2020-06-23 NOTE — Evaluation (Signed)
Occupational Therapy Evaluation Patient Details Name: Joan [name] MRN: [id number] DOB: Aug 22, 1950 Today's Date: 06/23/2020    History of Present Illness Joan [name] is a 70 y.o. ([birth year]) female who presents for hybrid revascularization on 06/22/20. PMH: anxiety/depression, breast cancer, PVD, and TIA.   Clinical Impression   Joan [name] was seen for OT evaluation this date. Prior to hospital admission, pt was Independent for mobility and I/ADLs including working part time. Pt lives with her partner who is bedbound, partner is cared for by her children who could asssit pt PRN as well. Pt lives in a 1 story home c ramped entrance. Pt presents to acute OT demonstrating impaired ADL performance and functional mobility 2/2 decreased activity tolerance, pain, and decreased LB access. Pt currently requires MAX A don B socks seated EOB, assist 2/2 pain. MIN A + HHA for SPT bed>chair. Pt primarily limited by pain, was previously refusing pain medicine but agreeable at end of session, RN notified. SpO2 90% on RA, RN notified pt left on RA. Pt would benefit from skilled OT to address noted impairments and functional limitations (see below for any additional details) in order to maximize safety and independence while minimizing falls risk and caregiver burden. Upon hospital discharge, recommend HHOT to maximize pt safety and return to functional independence during meaningful occupations of daily life.     Follow Up Recommendations  Home health OT    Equipment Recommendations  None recommended by OT    Recommendations for Other Services       Precautions / Restrictions Precautions Precautions: Fall Restrictions Weight Bearing Restrictions: No      Mobility Bed Mobility Overal bed mobility: Modified Independent             General bed mobility comments: increased time to complete 2/2 pain    Transfers Overall transfer level: Needs assistance Equipment used: 1 person hand held  assist Transfers: Stand Pivot Transfers   Stand pivot transfers: Min assist            Balance Overall balance assessment: Needs assistance Sitting-balance support: No upper extremity supported;Feet supported Sitting balance-Leahy Scale: Good     Standing balance support: Single extremity supported;During functional activity Standing balance-Leahy Scale: Fair                             ADL either performed or assessed with clinical judgement   ADL Overall ADL's : Needs assistance/impaired                                       General ADL Comments: MAX A don B socks seated EOB, assist 2/2 pain. MIN A + HHA for SPT bed>chair                  Pertinent Vitals/Pain Pain Assessment: Faces Faces Pain Scale: Hurts whole lot Pain Location: B groin Pain Descriptors / Indicators: Grimacing;Moaning;Shooting Pain Intervention(s): Limited activity within patient's tolerance;Repositioned;Patient requesting pain meds-RN notified     Hand Dominance     Extremity/Trunk Assessment Upper Extremity Assessment Upper Extremity Assessment: Overall WFL for tasks assessed   Lower Extremity Assessment Lower Extremity Assessment: Generalized weakness (limited by pain)       Communication Communication Communication: No difficulties   Cognition Arousal/Alertness: Awake/alert Behavior During Therapy: WFL for tasks assessed/performed Overall Cognitive Status: Within Functional Limits for tasks assessed  General Comments  SpO2 90s on RA    Exercises Exercises: Other exercises Other Exercises Other Exercises: Pt educated re: OT role, DME recs, d/c recs, falls prevention, pain mgmt Other Exercises: LBD, sup>sit, sit<>stnad, SPT, sitting/standing balance/tolerance   Shoulder Instructions      Home Living Family/patient expects to be discharged to:: Private residence Living Arrangements:  Spouse/significant other Available Help at Discharge: Family;Available PRN/intermittently Type of Home: House Home Access: Ramped entrance     Home Layout: One level     Bathroom Shower/Tub: Walk-in shower         Home Equipment: Environmental consultant - 2 wheels;Cane - single point;Bedside commode   Additional Comments: Pt lives with her partner who is bedbound, partner is cared for by her children who could asssit pt PRN as well.      Prior Functioning/Environment Level of Independence: Independent        Comments: Pt drives, works part time, no AD use        OT Problem List: Decreased range of motion;Decreased activity tolerance;Impaired balance (sitting and/or standing)      OT Treatment/Interventions: Self-care/ADL training;Therapeutic exercise;Energy conservation;DME and/or AE instruction;Therapeutic activities;Patient/family education;Balance training    OT Goals(Current goals can be found in the care plan section) Acute Rehab OT Goals Patient Stated Goal: To go home OT Goal Formulation: With patient Time For Goal Achievement: 07/07/20 Potential to Achieve Goals: Good ADL Goals Pt Will Perform Grooming: Independently;standing Pt Will Perform Lower Body Dressing: with modified independence;sitting/lateral leans Pt Will Transfer to Toilet: with modified independence;ambulating;regular height toilet (c LRAD PRN)  OT Frequency: Min 1X/week    AM-PAC OT "6 Clicks" Daily Activity     Outcome Measure Help from another person eating meals?: None Help from another person taking care of personal grooming?: None Help from another person toileting, which includes using toliet, bedpan, or urinal?: A Little Help from another person bathing (including washing, rinsing, drying)?: A Little Help from another person to put on and taking off regular upper body clothing?: None Help from another person to put on and taking off regular lower body clothing?: A Lot 6 Click Score: 20   End of  Session Nurse Communication: Other (comment) (O2 removed)  Activity Tolerance: Patient tolerated treatment well Patient left: in chair;with call bell/phone within reach;with nursing/sitter in room  OT Visit Diagnosis: Other abnormalities of gait and mobility (R26.89)                Time: 1130-1146 OT Time Calculation (min): 16 min Charges:  OT General Charges $OT Visit: 1 Visit OT Evaluation $OT Eval Low Complexity: 1 Low OT Treatments $Self Care/Home Management : 8-22 mins   Dessie Coma, M.S. OTR/L  06/23/20, 2:03 PM  ascom [phone]

## 2020-06-23 NOTE — Progress Notes (Signed)
Patient has been refusing pain medication through out the night. Explained to the patient importance of pain control, as she will be needing to get OOB in the morning. Patient has pain when coughing or moving, patient still declining any pain medication.

## 2020-06-23 NOTE — Progress Notes (Signed)
Chaplain made introductory visit to meet patient who was awake, seated upright, and watching television. Patient was content to watch true crime programming and Chaplain joined in viewing for a few moments. Chaplain shared being available for support as part of the care team. As social or spiritual support is needed Chaplain is ready to show up.

## 2020-06-23 NOTE — Progress Notes (Signed)
Pt currently on 1L [place] (Had to be put back on o2 d/t sats in upper 80s this evening, was tolerating room air for majority of shift).   Pt transitioned to regular diet per Dr. Lucky Cowboy.   PRN Tylenol and Oxy given 1x each today d/t acute surgical pain @ b/l femoral sites. Primarily pain only occurs when pt is moving and coughing.   PRN labetolol given 1x d/t SBP > 160.  Pt worked w/ PT and OT today.   Foley removed per order. Pt voiding via purewick.  Per Dr. Lucky Cowboy, transfer pt to med-surg floor, no telemetry necessary.    Transfer orders in place, waiting on bed.

## 2020-06-23 NOTE — Evaluation (Signed)
Physical Therapy Evaluation Patient Details Name: Joan [name] MRN: [id number] DOB: 1950/09/17 Today's Date: 06/23/2020   History of Present Illness  Joan [name] is a 70 y.o. (04-30-1950) female who presents for hybrid revascularization on 06/22/20. PMH: anxiety/depression, breast cancer, PVD, and TIA.    Clinical Impression  Patient alert, agreeable to PT up in recliner. Reported 9/10 pain in bilateral hip incisions. Reported at baseline she is independent, has assistance from family at home if needed.  The patient performed sit <> stand with RW and CGA, very painful for pt. Noted for trunk flexion and significant pain throughout ambulation, but she was able to ambulate ~74ft with CGA, no LOB noted. Returned to bed at patient request, minA for pain but anticipate pt able to complete without physical assist. PT and pt reviewed positioning post surgery including importance of trunk neutral and hips neutral to assist with wound healing/avoidance of scar tissue, pt verbalized understanding.  Overall the patient demonstrated deficits (see "PT Problem List") that impede the patient's functional abilities, safety, and mobility and would benefit from skilled PT intervention. Recommendation is HHPT with intermittent supervision.     Follow Up Recommendations Home health PT;Supervision - Intermittent    Equipment Recommendations  None recommended by PT    Recommendations for Other Services       Precautions / Restrictions Precautions Precautions: Fall Restrictions Weight Bearing Restrictions: No      Mobility  Bed Mobility Overal bed mobility: Needs Assistance Bed Mobility: Sit to Supine       Sit to supine: Min assist   General bed mobility comments: increased time to complete 2/2 pain, minA due to pain to assist with LEs    Transfers Overall transfer level: Needs assistance Equipment used: Rolling walker (2 wheeled) Transfers: Sit to/from Stand Sit to Stand: Min guard Stand  pivot transfers: Min assist          Ambulation/Gait   Gait Distance (Feet): 60 Feet Assistive device: Rolling walker (2 wheeled)       General Gait Details: pt with flexed trunk, reported significant pain throughout ambulation due to surgical site pain.  Stairs            Wheelchair Mobility    Modified Rankin (Stroke Patients Only)       Balance Overall balance assessment: Needs assistance Sitting-balance support: Feet supported Sitting balance-Leahy Scale: Good     Standing balance support: Bilateral upper extremity supported;During functional activity Standing balance-Leahy Scale: Fair                               Pertinent Vitals/Pain Pain Assessment: 0-10 Pain Score: 9  Faces Pain Scale: Hurts whole lot Pain Location: B groin Pain Descriptors / Indicators: Grimacing;Moaning;Shooting Pain Intervention(s): Limited activity within patient's tolerance;Monitored during session;Repositioned    Home Living Family/patient expects to be discharged to:: Private residence Living Arrangements: Spouse/significant other Available Help at Discharge: Family;Available PRN/intermittently Type of Home: House Home Access: Ramped entrance     Home Layout: One level Home Equipment: Walker - 2 wheels;Cane - single point;Bedside commode Additional Comments: Pt lives with her partner who is bedbound, partner is cared for by her children who could assist pt PRN as well.    Prior Function Level of Independence: Independent         Comments: Pt drives, works part time, no AD use     Hand Dominance  Extremity/Trunk Assessment   Upper Extremity Assessment Upper Extremity Assessment: Overall WFL for tasks assessed    Lower Extremity Assessment Lower Extremity Assessment:  (limited by pain)    Cervical / Trunk Assessment Cervical / Trunk Assessment: Kyphotic  Communication   Communication: No difficulties  Cognition Arousal/Alertness:  Awake/alert Behavior During Therapy: WFL for tasks assessed/performed Overall Cognitive Status: Within Functional Limits for tasks assessed                                        General Comments General comments (skin integrity, edema, etc.): SpO2 90s on RA    Exercises Other Exercises Other Exercises: Pt educated on importance of positioning s/p surgery, including supine and upright standing to address scar healing Other Exercises: LBD, sup>sit, sit<>stnad, SPT, sitting/standing balance/tolerance   Assessment/Plan    PT Assessment Patient needs continued PT services  PT Problem List Decreased strength;Decreased range of motion;Decreased activity tolerance;Decreased balance;Pain;Decreased mobility;Decreased knowledge of use of DME;Decreased knowledge of precautions       PT Treatment Interventions DME instruction;Balance training;Gait training;Neuromuscular re-education;Stair training;Functional mobility training;Patient/family education;Therapeutic activities;Therapeutic exercise    PT Goals (Current goals can be found in the Care Plan section)  Acute Rehab PT Goals Patient Stated Goal: to decrease pain PT Goal Formulation: With patient Time For Goal Achievement: 07/07/20 Potential to Achieve Goals: Good    Frequency Min 2X/week   Barriers to discharge        Co-evaluation               AM-PAC PT "6 Clicks" Mobility  Outcome Measure Help needed turning from your back to your side while in a flat bed without using bedrails?: None Help needed moving from lying on your back to sitting on the side of a flat bed without using bedrails?: None Help needed moving to and from a bed to a chair (including a wheelchair)?: None Help needed standing up from a chair using your arms (e.g., wheelchair or bedside chair)?: A Little Help needed to walk in hospital room?: A Little Help needed climbing 3-5 steps with a railing? : A Little 6 Click Score: 21    End of  Session Equipment Utilized During Treatment: Gait belt Activity Tolerance: Patient limited by pain Patient left: in bed;with call bell/phone within reach;with bed alarm set Nurse Communication: Mobility status PT Visit Diagnosis: Other abnormalities of gait and mobility (R26.89);Muscle weakness (generalized) (M62.81);Pain Pain - Right/Left: Left (and left) Pain - part of body:  (hips)    Time: 5038-8828 PT Time Calculation (min) (ACUTE ONLY): 20 min   Charges:   PT Evaluation $PT Eval Low Complexity: 1 Low PT Treatments $Therapeutic Exercise: 8-22 mins        Lieutenant Diego PT, DPT 3:17 PM,06/23/20

## 2020-06-23 NOTE — Progress Notes (Signed)
Joan [name]  Subjective: 06/22/20: 1. Left common femoral, profunda femoris, and superficial femoral artery endarterectomies 2.   Right common femoral, profunda femoris, and superficial femoral artery endarterectomies 3.  Catheter placement into the aorta from bilateral femoral approaches 4.  Aortogram and bilateral iliofemoral angiograms 5.  Kissing balloon stent placements bilateral common iliac arteries and the distal aorta with a 7 mm diameter by 26 mm length lifestream stent on the right 7 mm diameter by 58 mm length lifestream stent on the left 6.  Additional stent placement x3 to the right common iliac artery and external iliac artery with two 6 mm diameter by 38 mm length lifestream stents and an 8 mm diameter by 8 cm length life star stent 7.  Additional stent placement x2 to the left common iliac artery with a 7 mm diameter by 58 mm length lifestream stent into the left external iliac artery with a 7 mm diameter by 75 mm length Viabahn stent  Patient without complaint this AM. No acute issues overnight.  Objective: Vitals:   06/23/20 0925 06/23/20 1000 06/23/20 1100 06/23/20 1200  BP: (!) 144/64 (!) 140/108 136/67 133/65  Pulse:  74 70 65  Resp:  20 17 (!) 22  Temp:      TempSrc:      SpO2:  95% 95% 93%  Weight:      Height:        Intake/Output Summary (Last 24 hours) at 06/23/2020 1221 Last data filed at 06/23/2020 1110 Gross per 24 hour  Intake 3444.02 ml  Output 2575 ml  Net 869.02 ml   Physical Exam: A&Ox3, NAD CV: RRR Pulmonary: CTA Bilaterally Abdomen: Soft, Nontender, Nondistended Right Groin:  Incision: Clean dry and intact Left Groin:  Incision: Clean dry and intact Vascular:   Right lower extremity: Thigh soft.  Calf soft.  Extremities warm distally toes.  Good capillary refill.  Motor/sensory is intact.  Minimal edema.  Left lower extremity: Thigh soft.  Calf soft.  Extremities warm distally toes.  Good capillary  refill.  Motor/sensory is intact.  Minimal edema.   Laboratory: CBC    Component Value Date/Time   WBC 13.2 (H) 06/23/2020 0002   HGB 11.6 (L) 06/23/2020 0002   HCT 34.7 (L) 06/23/2020 0002   PLT 216 06/23/2020 0002   BMET    Component Value Date/Time   NA 136 06/23/2020 0002   K 4.8 06/23/2020 0002   CL 106 06/23/2020 0002   CO2 23 06/23/2020 0002   GLUCOSE 119 (H) 06/23/2020 0002   BUN 10 06/23/2020 0002   CREATININE 0.56 06/23/2020 0002   CALCIUM 7.7 (L) 06/23/2020 0002   GFRNONAA >60 06/23/2020 0002   GFRAA >60 11/12/2017 0948   Assessment/Planning: The patient is a 70 year old female status post bilateral femoral endarterectomies - POD#1  1) Hemoglobin stable 2) no acute issues overnight 3) discontinue Foley 4) out of bed / ambulation  Seen and examined with Dr. Ellis Parents Joan Weiss PA-C 06/23/2020 12:21 PM

## 2020-06-24 DIAGNOSIS — I70219 Atherosclerosis of native arteries of extremities with intermittent claudication, unspecified extremity: Secondary | ICD-10-CM | POA: Diagnosis not present

## 2020-06-24 LAB — BASIC METABOLIC PANEL
Anion gap: 7 (ref 5–15)
BUN: 8 mg/dL (ref 8–23)
CO2: 23 mmol/L (ref 22–32)
Calcium: 8.4 mg/dL — ABNORMAL LOW (ref 8.9–10.3)
Chloride: 103 mmol/L (ref 98–111)
Creatinine, Ser: 0.56 mg/dL (ref 0.44–1.00)
GFR, Estimated: >60 mL/min (ref >60)
Glucose, Bld: 121 mg/dL — ABNORMAL HIGH (ref 70–99)
Potassium: 3.6 mmol/L (ref 3.5–5.1)
Sodium: 133 mmol/L — ABNORMAL LOW (ref 135–145)

## 2020-06-24 MED ORDER — FAMOTIDINE 20 MG PO TABS
20.0000 mg | ORAL_TABLET | Freq: Two times a day (BID) | ORAL | Status: DC
  Administered 2020-06-24 – 2020-06-25 (×3): 20 mg via ORAL
  Filled 2020-06-24 (×3): qty 1

## 2020-06-24 NOTE — Progress Notes (Signed)
Physical Therapy Treatment Patient Details Name: Joan [name] MRN: [id number] DOB: 10/02/1950 Today's Date: 06/24/2020    History of Present Illness Joan [name] is a 70 y.o. ([birth year]) female who presents for hybrid revascularization on 06/22/20. PMH: anxiety/depression, breast cancer, PVD, and TIA.    PT Comments    Patient in bed, alert, reported 5/10 in bilateral hip incisions. Pt demonstrated improvement in mobility and activity tolerance. Bed mobility performed with bed rails and CGA. Sit <> stand with RW and CGA, cued for hand placement. She was able to ambulate ~78ft with RW and CGA, improved cadence and velocity noted. Returned to chair and assisted with gown and sock change with wipes due to small bout of incontinence. Pt up in chair with all needs reach. The patient would benefit from further skilled PT intervention to continue to progress towards goals. Recommendation remains appropriate.       Follow Up Recommendations  Home health PT;Supervision - Intermittent     Equipment Recommendations  None recommended by PT    Recommendations for Other Services       Precautions / Restrictions Precautions Precautions: Fall Restrictions Weight Bearing Restrictions: No    Mobility  Bed Mobility Overal bed mobility: Needs Assistance Bed Mobility: Supine to Sit       Sit to supine: Min guard   General bed mobility comments: use of bed rails    Transfers Overall transfer level: Needs assistance Equipment used: Rolling walker (2 wheeled) Transfers: Sit to/from Stand Sit to Stand: Min guard            Ambulation/Gait   Gait Distance (Feet): 80 Feet Assistive device: Rolling walker (2 wheeled)       General Gait Details: pt with improved activity tolerance, more upright posture   Stairs             Wheelchair Mobility    Modified Rankin (Stroke Patients Only)       Balance Overall balance assessment: Needs assistance Sitting-balance  support: Feet supported Sitting balance-Leahy Scale: Good     Standing balance support: Bilateral upper extremity supported;During functional activity Standing balance-Leahy Scale: Fair                              Cognition Arousal/Alertness: Awake/alert Behavior During Therapy: WFL for tasks assessed/performed Overall Cognitive Status: Within Functional Limits for tasks assessed                                        Exercises      General Comments        Pertinent Vitals/Pain Pain Assessment: 0-10 Pain Score: 5  Pain Location: B groin Pain Descriptors / Indicators: Grimacing;Moaning;Shooting Pain Intervention(s): Limited activity within patient's tolerance;Monitored during session;Repositioned    Home Living                      Prior Function            PT Goals (current goals can now be found in the care plan section) Progress towards PT goals: Progressing toward goals    Frequency    Min 2X/week      PT Plan Current plan remains appropriate    Co-evaluation              AM-PAC PT "6 Clicks" Mobility   Outcome  Measure  Help needed turning from your back to your side while in a flat bed without using bedrails?: None Help needed moving from lying on your back to sitting on the side of a flat bed without using bedrails?: None Help needed moving to and from a bed to a chair (including a wheelchair)?: None Help needed standing up from a chair using your arms (e.g., wheelchair or bedside chair)?: A Little Help needed to walk in hospital room?: A Little Help needed climbing 3-5 steps with a railing? : A Little 6 Click Score: 21    End of Session Equipment Utilized During Treatment: Gait belt Activity Tolerance: Patient limited by pain Patient left: in bed;with call bell/phone within reach;with bed alarm set Nurse Communication: Mobility status PT Visit Diagnosis: Other abnormalities of gait and mobility  (R26.89);Muscle weakness (generalized) (M62.81);Pain Pain - Right/Left: Left (and right) Pain - part of body:  (hips)     Time: 4239-5320 PT Time Calculation (min) (ACUTE ONLY): 24 min  Charges:  $Therapeutic Exercise: 23-37 mins                     Lieutenant Diego PT, DPT 2:40 PM,06/24/20

## 2020-06-24 NOTE — Progress Notes (Signed)
Pt arrival to [facility], transfer from ICU. Pt oriented to room and unit. Pt denies any needs or concerns at this time, will continue to monitor and assess.

## 2020-06-24 NOTE — Progress Notes (Signed)
PHARMACIST - PHYSICIAN COMMUNICATION  CONCERNING: IV to Oral Route Change Policy  RECOMMENDATION: This patient is receiving famotidine by the intravenous route.  Based on criteria approved by the Pharmacy and Therapeutics Committee, the intravenous medication(s) is/are being converted to the equivalent oral dose form(s).   DESCRIPTION: These criteria include:  The patient is eating (either orally or via tube) and/or has been taking other orally administered medications for a least 24 hours  The patient has no evidence of active gastrointestinal bleeding or impaired GI absorption (gastrectomy, short bowel, patient on TNA or NPO).  If you have questions about this conversion, please contact the [place] 06/24/2020 7:59 AM

## 2020-06-24 NOTE — Progress Notes (Signed)
[facility] Daily Progress Note  06/22/20: 1. Left common femoral, profunda femoris, and superficial femoral artery endarterectomies 2. Right common femoral, profunda femoris, and superficial femoral artery endarterectomies 3.Catheter placement into the aorta from bilateral femoral approaches 4.Aortogram and bilateral iliofemoral angiograms 5.Kissing balloon stent placements bilateral common iliac arteries and the distal aorta with a 7 mm diameter by 26 mm length lifestream stent on the right 7 mm diameter by 58 mm length lifestream stent on the left 6.Additional stent placement x3 to the right common iliac artery and external iliac artery withtwo6 mm diameter by 38 mm length lifestream stents and an 8 mm diameter by 8 cm length life star stent 7.Additional stent placement x2 to the left common iliac artery with a 7 mm diameter by 58 mm length lifestream stent into the left external iliac artery with a 7 mm diameter by 75 mm length Viabahn stent  Subjective: Patient without complaint this AM. No issues overnight. Notes that she worked with physical therapy yesterday.   Objective: Vitals:   06/24/20 0800 06/24/20 0900 06/24/20 1000 06/24/20 1100  BP: (!) 173/76 (!) 115/94 108/71 112/62  Pulse: 80 78 66 61  Resp: (!) 23 (!) 27 (!) 22 (!) 23  Temp:      TempSrc:      SpO2: 94% 94% 95% 96%  Weight:      Height:        Intake/Output Summary (Last 24 hours) at 06/24/2020 1132 Last data filed at 06/23/2020 1800 Gross per 24 hour  Intake 50 ml  Output 400 ml  Net -350 ml   Physical Exam: A&Ox3, NAD CV: RRR Pulmonary: CTA Bilaterally Abdomen: Soft, Nontender, Nondistended Right Groin:             Incision: Clean, dry, and intact Left Groin:             Incision: Clean, dry, and intact Vascular:              Right lower extremity: Thigh soft. Calf soft. Extremities warm distally toes. Good capillary refill. Motor/sensory is intact. Minimal edema.              Left lower extremity: Thigh soft. Calf soft. Extremities warm distally toes. Good capillary refill. Motor/sensory is intact. Minimal edema.   Laboratory: CBC    Component Value Date/Time   WBC 13.4 (H) 06/23/2020 1256   HGB 12.2 06/23/2020 1256   HCT 36.5 06/23/2020 1256   PLT 229 06/23/2020 1256   BMET    Component Value Date/Time   NA 133 (L) 06/24/2020 0530   K 3.6 06/24/2020 0530   CL 103 06/24/2020 0530   CO2 23 06/24/2020 0530   GLUCOSE 121 (H) 06/24/2020 0530   BUN 8 06/24/2020 0530   CREATININE 0.56 06/24/2020 0530   CALCIUM 8.4 (L) 06/24/2020 0530   GFRNONAA >60 06/24/2020 0530   GFRAA >60 11/12/2017 0948   Assessment/Planning: The patient is a 70 year old female status post bilateral femoral endarterectomies - POD#2  1) Hemoglobin stable. Continue to monitor AM CBC. 2) No acute issues overnight, OK to transfer to surgical floor. 3) Foley removed POD#1, urinating.  4) out of bed / ambulation / PT. 5) On ASA, Plavix and Statin for medical management. 6) Appreciate recommendations from OT / PT 7) Will place social work consult for home services  Discussed with Dr. Ellis Parents [facility] PA-C 06/24/2020 11:32 AM

## 2020-06-25 ENCOUNTER — Encounter: Payer: Self-pay | Admitting: Vascular Surgery

## 2020-06-25 DIAGNOSIS — Z515 Encounter for palliative care: Secondary | ICD-10-CM | POA: Insufficient documentation

## 2020-06-25 LAB — CBC
HCT: 34.8 % — ABNORMAL LOW (ref 36.0–46.0)
Hemoglobin: 11.7 g/dL — ABNORMAL LOW (ref 12.0–15.0)
MCH: 30 pg (ref 26.0–34.0)
MCHC: 33.6 g/dL (ref 30.0–36.0)
MCV: 89.2 fL (ref 80.0–100.0)
Platelets: 228 10*3/uL (ref 150–400)
RBC: 3.9 MIL/uL (ref 3.87–5.11)
RDW: 13.6 % (ref 11.5–15.5)
WBC: 13.4 10*3/uL — ABNORMAL HIGH (ref 4.0–10.5)
nRBC: 0 % (ref 0.0–0.2)

## 2020-06-25 LAB — BASIC METABOLIC PANEL
Anion gap: 9 (ref 5–15)
BUN: 8 mg/dL (ref 8–23)
CO2: 23 mmol/L (ref 22–32)
Calcium: 8.4 mg/dL — ABNORMAL LOW (ref 8.9–10.3)
Chloride: 103 mmol/L (ref 98–111)
Creatinine, Ser: 0.51 mg/dL (ref 0.44–1.00)
GFR, Estimated: >60 mL/min (ref >60)
Glucose, Bld: 100 mg/dL — ABNORMAL HIGH (ref 70–99)
Potassium: 3.6 mmol/L (ref 3.5–5.1)
Sodium: 135 mmol/L (ref 135–145)

## 2020-06-25 LAB — MAGNESIUM: Magnesium: 2.3 mg/dL (ref 1.7–2.4)

## 2020-06-25 MED ORDER — ALUM & MAG HYDROXIDE-SIMETH 200-200-20 MG/5ML PO SUSP: 15.0000 mL | ORAL | 0 refills | Status: DC | PRN

## 2020-06-25 MED ORDER — OXYCODONE-ACETAMINOPHEN 5-325 MG PO TABS: 1.0000 | ORAL_TABLET | ORAL | 0 refills | Status: DC | PRN

## 2020-06-25 MED ORDER — DOCUSATE SODIUM 100 MG PO CAPS
100.0000 mg | ORAL_CAPSULE | Freq: Every day | ORAL | 0 refills | Status: DC
Start: 2020-06-26 — End: 2021-04-10

## 2020-06-25 MED ORDER — ACETAMINOPHEN 325 MG PO TABS: 325.0000 mg | ORAL_TABLET | ORAL | 1 refills | Status: AC | PRN

## 2020-06-25 NOTE — Progress Notes (Signed)
Pt's PIV removed with tip intact. Discharge instructions explained with pt. Pt denies any questions or concerns. Pt to pick up meds from home pharmacy. [place] arranged by Education officer, museum. Pt to make follow up apt with surgeon on Monday.

## 2020-06-25 NOTE — Hospital Course (Signed)
POD#4  Patient is doing well and has been up and about.  She is ready to go home.  Her vital signs are stable and she is alert and oriented x3.  Clinical exam is unremarkable, groin incisions are clear and dressings intact.  Okay to be discharged.

## 2020-06-25 NOTE — Progress Notes (Signed)
Mobility Specialist - Progress Note   06/25/20 1300  Mobility  Activity Ambulated in hall  Level of Assistance Modified independent, requires aide device or extra time  Assistive Device Front wheel walker  Distance Ambulated (ft) 80 ft  Mobility Ambulated independently in hallway  Mobility Response Tolerated well  Mobility performed by Mobility specialist  $Mobility charge 1 Mobility    Post-mobility: 111 HR, 98% SPO2   Pt sitting EOB on arrival utilizing RA. Pt ambulated in hallway with RW, no LOB. No physical assist this date. No reports of pain. Pt denied SOB throughout session. Anticipating d/c later this date.    Kathee Delton Mobility Specialist 06/25/20, 1:29 PM

## 2020-06-25 NOTE — Discharge Summary (Signed)
Physician Discharge Summary  Patient ID: ILMA ACHEE MRN: [id number] DOB/AGE: 68-Nov-1952 68 y.o.  Admit date: 06/22/2020 Discharge date: 06/25/2020  Admission Diagnoses:  Discharge Diagnoses:  Active Problems:   Atherosclerosis of artery of extremity with rest pain [facility])   Hospice care patient   Palliative care patient   Discharged Condition: good  Hospital Course: POD 4   Consults: vascular surgery  Significant Diagnostic Studies: labs: Unremarkable  Treatments: surgery: Bilateral common femoral endarterectomy with endovascular therapy of iliac vessels.   Discharge Exam: Blood pressure 128/72, pulse 92, temperature 97.7 F (36.5 C), temperature source Oral, resp. rate 18, height 5\' 4"  (1.626 m), weight 63.5 kg, SpO2 93 %. General appearance: alert, cooperative and appears older than stated age Head: Normocephalic, without obvious abnormality, atraumatic Back: symmetric, no curvature. ROM normal. No CVA tenderness. Resp: clear to auscultation bilaterally Extremities: extremities normal, atraumatic, no cyanosis or edema Pulses: Left Pulses: FEM: present 1+, POP: present 1+, DP: present 1+, PT: present 1+ intact clean dressings Incision/Wound:Intact dressings bilaterally.      Disposition: Discharge disposition: 06-Home-Health Care Svc        Allergies as of 06/25/2020      Reactions   Seasonal Ic [cholestatin]    Seasonal allergies      Medication List    TAKE these medications   acetaminophen 325 MG tablet Commonly known as: TYLENOL Take 1-2 tablets (325-650 mg total) by mouth every 4 (four) hours as needed for mild pain (or temp >/= 101 F).   ALPRAZolam 0.5 MG tablet Commonly known as: XANAX Take 0.5 mg by mouth 2 (two) times daily as needed for anxiety.   alum & mag hydroxide-simeth 200-200-20 MG/5ML suspension Commonly known as: MAALOX/MYLANTA Take 15-30 mLs by mouth every 2 (two) hours as needed for indigestion.   aspirin EC 81 MG tablet Take 81  mg by mouth daily.   atorvastatin 10 MG tablet Commonly known as: Lipitor Take 1 tablet (10 mg total) by mouth daily.   clopidogrel 75 MG tablet Commonly known as: Plavix Take 1 tablet (75 mg total) by mouth daily.   docusate sodium 100 MG capsule Commonly known as: COLACE Take 1 capsule (100 mg total) by mouth daily. Start taking on: June 26, 2020   lisinopril 20 MG tablet Commonly known as: ZESTRIL Take 20 mg by mouth daily.   loratadine 10 MG tablet Commonly known as: CLARITIN Take 10 mg by mouth daily as needed for allergies.   oxyCODONE-acetaminophen 5-325 MG tablet Commonly known as: PERCOCET/ROXICET Take 1-2 tablets by mouth every 4 (four) hours as needed for moderate pain.   rosuvastatin 5 MG tablet Commonly known as: CRESTOR Take 5 mg by mouth daily.   sertraline 100 MG tablet Commonly known as: ZOLOFT Take 100 mg by mouth daily.        Signed: Elmore Guise 06/25/2020, 12:20 PM

## 2020-06-25 NOTE — TOC Initial Note (Signed)
Transition of Care [facility]) - Initial/Assessment Note    Patient Details  Name: Joan [name] MRN: [id number] Date of Birth: May 13, 1950  [facility]) CM/SW Contact:    Magnus Ivan, LCSW Phone Number: 06/25/2020, 10:04 AM  Clinical Narrative:                CSW spoke with patient about discharge planning. Patient lives with her partner. Drives herself to appointments. PCP is Dr. Ginette Pitman. Pharmacy is [facility] order. Patient says she has access to DME if needed. No HH history. Patient is agreeable to [facility] recommendation, no agency preference. Referral made to [facility]. Patient denies additional TOC needs at this time.   Expected Discharge Plan: [place] Barriers to Discharge: Continued Medical Work up   Patient Goals and CMS Choice Patient states their goals for this hospitalization and ongoing recovery are:: home with home health CMS Medicare.gov Compare Post Acute Care list provided to:: Patient Choice offered to / list presented to : Patient  Expected Discharge Plan and Services Expected Discharge Plan: [place]       Living arrangements for the past 2 months: Single Family Home                           HH Arranged: PT,OT [place] Agency: [place] Date [facility] Agency Contacted: 06/25/20   Representative spoke with at [place]: Tommi Rumps  Prior Living Arrangements/Services Living arrangements for the past 2 months: [place] Lives with:: Domestic Partner Patient language and need for interpreter reviewed:: Yes Do you feel safe going back to the place where you live?: Yes      Need for Family Participation in Patient Care: Yes (Comment) Care giver support system in place?: Yes (comment) Current home services: DME Criminal Activity/Legal Involvement Pertinent to Current Situation/Hospitalization: No - Comment as needed  Activities of Daily Living Home Assistive Devices/Equipment: Grab bars  in shower,Eyeglasses,Dentures (specify type),Shower chair without back ADL Screening (condition at time of admission) Patient's cognitive ability adequate to safely complete daily activities?: Yes Is the patient deaf or have difficulty hearing?: No Does the patient have difficulty seeing, even when wearing glasses/contacts?: No Does the patient have difficulty concentrating, remembering, or making decisions?: No Patient able to express need for assistance with ADLs?: Yes Does the patient have difficulty dressing or bathing?: No Independently performs ADLs?: Yes (appropriate for developmental age) Does the patient have difficulty walking or climbing stairs?: No Weakness of Legs: Both Weakness of Arms/Hands: None  Permission Sought/Granted Permission sought to share information with : Investment banker, corporate granted to share info w AGENCY: [place] agencies, no preference        Emotional Assessment       Orientation: : Oriented to Self,Oriented to Place,Oriented to  Time,Oriented to Situation Alcohol / Substance Use: Not Applicable Psych Involvement: No (comment)  Admission diagnosis:  Atherosclerosis of artery of extremity with rest pain [facility]) [I70.229] Patient Active Problem List   Diagnosis Date Noted  . Atherosclerosis of artery of extremity with rest pain ([place]) 06/22/2020  . Major depressive disorder, recurrent, in remission ([place]) 06/12/2018  . PAD (peripheral artery disease) ([place]) 02/10/2018  . Atherosclerosis of native arteries of extremity with intermittent claudication ([place]) 10/25/2017  . History of breast cancer in female 10/11/2017  . Tobacco user 08/31/2015  . Benign essential hypertension 08/31/2015  .  Anxiety and depression 08/31/2015  . H/O seasonal allergies 08/31/2015  . Seizure ([place]) 07/15/2013  . Encephalopathy acute 07/15/2013  . Hypertension 07/15/2013  . Alcohol abuse 07/15/2013  . Hyponatremia syndrome 07/14/2013  . Provoked  seizure ([place]) 07/14/2013  . Alcohol withdrawal ([place]) 07/14/2013  . Hypernatremia 07/14/2013   PCP:  Tracie Harrier, MD Pharmacy:   [place], [address] [place] [address] Phone: [phone] Fax: [phone]  [id number] [place], [place] - [address] AT [facility] [address] [place] [address] Phone: [phone] Fax: [phone]     Social Determinants of Health ([place]) Interventions    Readmission Risk Interventions No flowsheet data found.

## 2020-06-25 NOTE — Progress Notes (Signed)
3 Days Post-Op   Subjective/Chief Complaint: In good spirits and ready to go home.   Objective: Vital signs in last 24 hours: Temp:  [97.7 F (36.5 C)-99 F (37.2 C)] 97.7 F (36.5 C) (06/04 1150) Pulse Rate:  [75-101] 92 (06/04 1150) Resp:  [18-24] 18 (06/04 1150) BP: (103-158)/(58-93) 128/72 (06/04 1150) SpO2:  [91 %-97 %] 93 % (06/04 1150) Last BM Date: 06/22/20  Intake/Output from previous day: 06/03 0701 - 06/04 0700 In: -  Out: 2350 [Urine:2350] Intake/Output this shift: Total I/O In: 240 [P.O.:240] Out: -   General appearance: alert, cooperative and appears older than stated age Head: Normocephalic, without obvious abnormality, atraumatic Resp: clear to auscultation bilaterally Cardio: regular rate and rhythm, S1, S2 normal, no murmur, click, rub or gallop Extremities: extremities normal, atraumatic, no cyanosis or edema Skin: Skin color, texture, turgor normal. No rashes or lesions Incision/Wound: Groin incisions clean and dry.  Surgical dressing intact.  Lab Results:  Recent Labs    06/23/20 1256 06/25/20 0457  WBC 13.4* 13.4*  HGB 12.2 11.7*  HCT 36.5 34.8*  PLT 229 228   BMET Recent Labs    06/24/20 0530 06/25/20 0457  NA 133* 135  K 3.6 3.6  CL 103 103  CO2 23 23  GLUCOSE 121* 100*  BUN 8 8  CREATININE 0.56 0.51  CALCIUM 8.4* 8.4*   PT/INR No results for input(s): LABPROT, INR in the last 72 hours. ABG No results for input(s): PHART, HCO3 in the last 72 hours.  Invalid input(s): PCO2, PO2  Studies/Results: No results found.  Anti-infectives: Anti-infectives (From admission, onward)   Start     Dose/Rate Route Frequency Ordered Stop   06/22/20 1800  ceFAZolin (ANCEF) IVPB 2g/100 mL premix        2 g 200 mL/hr over 30 Minutes Intravenous Every 8 hours 06/22/20 1707 06/23/20 0329   06/22/20 0600  ceFAZolin (ANCEF) IVPB 2g/100 mL premix        2 g 200 mL/hr over 30 Minutes Intravenous On call to O.R. 06/22/20 0302 06/22/20 1233       Assessment/Plan: s/p Procedure(s): ENDARTERECTOMY FEMORAL (Bilateral) INSERTION OF ILIAC STENT (Bilateral) APPLICATION OF CELL SAVER (N/A) d/c foley Discharge PT at home for one week.   LOS: 3 days    Elmore Guise 06/25/2020

## 2020-06-29 DIAGNOSIS — F32A Depression, unspecified: Secondary | ICD-10-CM | POA: Diagnosis not present

## 2020-06-29 DIAGNOSIS — Z9181 History of falling: Secondary | ICD-10-CM | POA: Diagnosis not present

## 2020-06-29 DIAGNOSIS — Z8673 Personal history of transient ischemic attack (TIA), and cerebral infarction without residual deficits: Secondary | ICD-10-CM | POA: Diagnosis not present

## 2020-06-29 DIAGNOSIS — Z7902 Long term (current) use of antithrombotics/antiplatelets: Secondary | ICD-10-CM | POA: Diagnosis not present

## 2020-06-29 DIAGNOSIS — Z7982 Long term (current) use of aspirin: Secondary | ICD-10-CM | POA: Diagnosis not present

## 2020-06-29 DIAGNOSIS — Z48812 Encounter for surgical aftercare following surgery on the circulatory system: Secondary | ICD-10-CM | POA: Diagnosis not present

## 2020-06-29 DIAGNOSIS — F419 Anxiety disorder, unspecified: Secondary | ICD-10-CM | POA: Diagnosis not present

## 2020-06-29 DIAGNOSIS — C50919 Malignant neoplasm of unspecified site of unspecified female breast: Secondary | ICD-10-CM | POA: Diagnosis not present

## 2020-06-29 DIAGNOSIS — I739 Peripheral vascular disease, unspecified: Secondary | ICD-10-CM | POA: Diagnosis not present

## 2020-06-30 DIAGNOSIS — Z7902 Long term (current) use of antithrombotics/antiplatelets: Secondary | ICD-10-CM | POA: Diagnosis not present

## 2020-06-30 DIAGNOSIS — F419 Anxiety disorder, unspecified: Secondary | ICD-10-CM | POA: Diagnosis not present

## 2020-06-30 DIAGNOSIS — Z7982 Long term (current) use of aspirin: Secondary | ICD-10-CM | POA: Diagnosis not present

## 2020-06-30 DIAGNOSIS — Z48812 Encounter for surgical aftercare following surgery on the circulatory system: Secondary | ICD-10-CM | POA: Diagnosis not present

## 2020-06-30 DIAGNOSIS — F32A Depression, unspecified: Secondary | ICD-10-CM | POA: Diagnosis not present

## 2020-06-30 DIAGNOSIS — C50919 Malignant neoplasm of unspecified site of unspecified female breast: Secondary | ICD-10-CM | POA: Diagnosis not present

## 2020-06-30 DIAGNOSIS — I739 Peripheral vascular disease, unspecified: Secondary | ICD-10-CM | POA: Diagnosis not present

## 2020-06-30 DIAGNOSIS — Z9181 History of falling: Secondary | ICD-10-CM | POA: Diagnosis not present

## 2020-06-30 DIAGNOSIS — Z8673 Personal history of transient ischemic attack (TIA), and cerebral infarction without residual deficits: Secondary | ICD-10-CM | POA: Diagnosis not present

## 2020-07-01 DIAGNOSIS — Z48812 Encounter for surgical aftercare following surgery on the circulatory system: Secondary | ICD-10-CM | POA: Diagnosis not present

## 2020-07-01 DIAGNOSIS — I739 Peripheral vascular disease, unspecified: Secondary | ICD-10-CM | POA: Diagnosis not present

## 2020-07-01 DIAGNOSIS — F419 Anxiety disorder, unspecified: Secondary | ICD-10-CM | POA: Diagnosis not present

## 2020-07-01 DIAGNOSIS — Z8673 Personal history of transient ischemic attack (TIA), and cerebral infarction without residual deficits: Secondary | ICD-10-CM | POA: Diagnosis not present

## 2020-07-01 DIAGNOSIS — Z7902 Long term (current) use of antithrombotics/antiplatelets: Secondary | ICD-10-CM | POA: Diagnosis not present

## 2020-07-01 DIAGNOSIS — C50919 Malignant neoplasm of unspecified site of unspecified female breast: Secondary | ICD-10-CM | POA: Diagnosis not present

## 2020-07-01 DIAGNOSIS — F32A Depression, unspecified: Secondary | ICD-10-CM | POA: Diagnosis not present

## 2020-07-01 DIAGNOSIS — Z9181 History of falling: Secondary | ICD-10-CM | POA: Diagnosis not present

## 2020-07-01 DIAGNOSIS — Z7982 Long term (current) use of aspirin: Secondary | ICD-10-CM | POA: Diagnosis not present

## 2020-07-05 DIAGNOSIS — C50919 Malignant neoplasm of unspecified site of unspecified female breast: Secondary | ICD-10-CM | POA: Diagnosis not present

## 2020-07-05 DIAGNOSIS — Z48812 Encounter for surgical aftercare following surgery on the circulatory system: Secondary | ICD-10-CM | POA: Diagnosis not present

## 2020-07-05 DIAGNOSIS — Z7902 Long term (current) use of antithrombotics/antiplatelets: Secondary | ICD-10-CM | POA: Diagnosis not present

## 2020-07-05 DIAGNOSIS — F32A Depression, unspecified: Secondary | ICD-10-CM | POA: Diagnosis not present

## 2020-07-05 DIAGNOSIS — Z9181 History of falling: Secondary | ICD-10-CM | POA: Diagnosis not present

## 2020-07-05 DIAGNOSIS — Z8673 Personal history of transient ischemic attack (TIA), and cerebral infarction without residual deficits: Secondary | ICD-10-CM | POA: Diagnosis not present

## 2020-07-05 DIAGNOSIS — I739 Peripheral vascular disease, unspecified: Secondary | ICD-10-CM | POA: Diagnosis not present

## 2020-07-05 DIAGNOSIS — F419 Anxiety disorder, unspecified: Secondary | ICD-10-CM | POA: Diagnosis not present

## 2020-07-05 DIAGNOSIS — Z7982 Long term (current) use of aspirin: Secondary | ICD-10-CM | POA: Diagnosis not present

## 2020-07-08 ENCOUNTER — Other Ambulatory Visit (INDEPENDENT_AMBULATORY_CARE_PROVIDER_SITE_OTHER): Payer: Self-pay | Admitting: Vascular Surgery

## 2020-07-08 DIAGNOSIS — J309 Allergic rhinitis, unspecified: Secondary | ICD-10-CM | POA: Diagnosis not present

## 2020-07-08 DIAGNOSIS — F419 Anxiety disorder, unspecified: Secondary | ICD-10-CM | POA: Diagnosis not present

## 2020-07-08 DIAGNOSIS — I739 Peripheral vascular disease, unspecified: Secondary | ICD-10-CM | POA: Diagnosis not present

## 2020-07-08 DIAGNOSIS — Z853 Personal history of malignant neoplasm of breast: Secondary | ICD-10-CM | POA: Diagnosis not present

## 2020-07-08 DIAGNOSIS — F32A Depression, unspecified: Secondary | ICD-10-CM | POA: Diagnosis not present

## 2020-07-08 DIAGNOSIS — I70219 Atherosclerosis of native arteries of extremities with intermittent claudication, unspecified extremity: Secondary | ICD-10-CM

## 2020-07-08 DIAGNOSIS — Z09 Encounter for follow-up examination after completed treatment for conditions other than malignant neoplasm: Secondary | ICD-10-CM | POA: Diagnosis not present

## 2020-07-08 DIAGNOSIS — Z9181 History of falling: Secondary | ICD-10-CM | POA: Diagnosis not present

## 2020-07-08 DIAGNOSIS — Z95820 Peripheral vascular angioplasty status with implants and grafts: Secondary | ICD-10-CM

## 2020-07-08 DIAGNOSIS — C50919 Malignant neoplasm of unspecified site of unspecified female breast: Secondary | ICD-10-CM | POA: Diagnosis not present

## 2020-07-08 DIAGNOSIS — Z48812 Encounter for surgical aftercare following surgery on the circulatory system: Secondary | ICD-10-CM | POA: Diagnosis not present

## 2020-07-08 DIAGNOSIS — F329 Major depressive disorder, single episode, unspecified: Secondary | ICD-10-CM | POA: Diagnosis not present

## 2020-07-08 DIAGNOSIS — Z7902 Long term (current) use of antithrombotics/antiplatelets: Secondary | ICD-10-CM | POA: Diagnosis not present

## 2020-07-08 DIAGNOSIS — Z7982 Long term (current) use of aspirin: Secondary | ICD-10-CM | POA: Diagnosis not present

## 2020-07-08 DIAGNOSIS — Z87891 Personal history of nicotine dependence: Secondary | ICD-10-CM | POA: Diagnosis not present

## 2020-07-08 DIAGNOSIS — Z8673 Personal history of transient ischemic attack (TIA), and cerebral infarction without residual deficits: Secondary | ICD-10-CM | POA: Diagnosis not present

## 2020-07-09 DIAGNOSIS — Z48812 Encounter for surgical aftercare following surgery on the circulatory system: Secondary | ICD-10-CM | POA: Diagnosis not present

## 2020-07-09 DIAGNOSIS — F32A Depression, unspecified: Secondary | ICD-10-CM | POA: Diagnosis not present

## 2020-07-09 DIAGNOSIS — Z7902 Long term (current) use of antithrombotics/antiplatelets: Secondary | ICD-10-CM | POA: Diagnosis not present

## 2020-07-09 DIAGNOSIS — Z7982 Long term (current) use of aspirin: Secondary | ICD-10-CM | POA: Diagnosis not present

## 2020-07-09 DIAGNOSIS — F419 Anxiety disorder, unspecified: Secondary | ICD-10-CM | POA: Diagnosis not present

## 2020-07-09 DIAGNOSIS — Z9181 History of falling: Secondary | ICD-10-CM | POA: Diagnosis not present

## 2020-07-09 DIAGNOSIS — C50919 Malignant neoplasm of unspecified site of unspecified female breast: Secondary | ICD-10-CM | POA: Diagnosis not present

## 2020-07-09 DIAGNOSIS — I739 Peripheral vascular disease, unspecified: Secondary | ICD-10-CM | POA: Diagnosis not present

## 2020-07-09 DIAGNOSIS — Z8673 Personal history of transient ischemic attack (TIA), and cerebral infarction without residual deficits: Secondary | ICD-10-CM | POA: Diagnosis not present

## 2020-07-11 DIAGNOSIS — Z7902 Long term (current) use of antithrombotics/antiplatelets: Secondary | ICD-10-CM | POA: Diagnosis not present

## 2020-07-11 DIAGNOSIS — Z48812 Encounter for surgical aftercare following surgery on the circulatory system: Secondary | ICD-10-CM | POA: Diagnosis not present

## 2020-07-11 DIAGNOSIS — F419 Anxiety disorder, unspecified: Secondary | ICD-10-CM | POA: Diagnosis not present

## 2020-07-11 DIAGNOSIS — I739 Peripheral vascular disease, unspecified: Secondary | ICD-10-CM | POA: Diagnosis not present

## 2020-07-11 DIAGNOSIS — C50919 Malignant neoplasm of unspecified site of unspecified female breast: Secondary | ICD-10-CM | POA: Diagnosis not present

## 2020-07-11 DIAGNOSIS — F32A Depression, unspecified: Secondary | ICD-10-CM | POA: Diagnosis not present

## 2020-07-11 DIAGNOSIS — Z7982 Long term (current) use of aspirin: Secondary | ICD-10-CM | POA: Diagnosis not present

## 2020-07-12 ENCOUNTER — Encounter (INDEPENDENT_AMBULATORY_CARE_PROVIDER_SITE_OTHER): Payer: Self-pay | Admitting: Nurse Practitioner

## 2020-07-12 ENCOUNTER — Ambulatory Visit (INDEPENDENT_AMBULATORY_CARE_PROVIDER_SITE_OTHER): Payer: Medicare HMO | Admitting: Nurse Practitioner

## 2020-07-12 ENCOUNTER — Ambulatory Visit (INDEPENDENT_AMBULATORY_CARE_PROVIDER_SITE_OTHER): Payer: Medicare HMO

## 2020-07-12 ENCOUNTER — Other Ambulatory Visit: Payer: Self-pay

## 2020-07-12 VITALS — BP 148/87 | HR 70 | Resp 16 | Wt 155.8 lb

## 2020-07-12 DIAGNOSIS — I70219 Atherosclerosis of native arteries of extremities with intermittent claudication, unspecified extremity: Secondary | ICD-10-CM

## 2020-07-12 DIAGNOSIS — C50919 Malignant neoplasm of unspecified site of unspecified female breast: Secondary | ICD-10-CM | POA: Diagnosis not present

## 2020-07-12 DIAGNOSIS — I10 Essential (primary) hypertension: Secondary | ICD-10-CM

## 2020-07-12 DIAGNOSIS — I739 Peripheral vascular disease, unspecified: Secondary | ICD-10-CM | POA: Diagnosis not present

## 2020-07-12 DIAGNOSIS — Z9181 History of falling: Secondary | ICD-10-CM | POA: Diagnosis not present

## 2020-07-12 DIAGNOSIS — Z720 Tobacco use: Secondary | ICD-10-CM

## 2020-07-12 DIAGNOSIS — Z48812 Encounter for surgical aftercare following surgery on the circulatory system: Secondary | ICD-10-CM | POA: Diagnosis not present

## 2020-07-12 DIAGNOSIS — F419 Anxiety disorder, unspecified: Secondary | ICD-10-CM | POA: Diagnosis not present

## 2020-07-12 DIAGNOSIS — Z95820 Peripheral vascular angioplasty status with implants and grafts: Secondary | ICD-10-CM

## 2020-07-12 DIAGNOSIS — F32A Depression, unspecified: Secondary | ICD-10-CM | POA: Diagnosis not present

## 2020-07-12 DIAGNOSIS — Z7902 Long term (current) use of antithrombotics/antiplatelets: Secondary | ICD-10-CM | POA: Diagnosis not present

## 2020-07-12 DIAGNOSIS — Z8673 Personal history of transient ischemic attack (TIA), and cerebral infarction without residual deficits: Secondary | ICD-10-CM | POA: Diagnosis not present

## 2020-07-12 DIAGNOSIS — Z7982 Long term (current) use of aspirin: Secondary | ICD-10-CM | POA: Diagnosis not present

## 2020-07-12 MED ORDER — SILVER SULFADIAZINE 1 % EX CREA: 1.0000 "application " | TOPICAL_CREAM | Freq: Every day | CUTANEOUS | 0 refills | Status: DC

## 2020-07-13 DIAGNOSIS — Z7902 Long term (current) use of antithrombotics/antiplatelets: Secondary | ICD-10-CM | POA: Diagnosis not present

## 2020-07-13 DIAGNOSIS — C50919 Malignant neoplasm of unspecified site of unspecified female breast: Secondary | ICD-10-CM | POA: Diagnosis not present

## 2020-07-13 DIAGNOSIS — F419 Anxiety disorder, unspecified: Secondary | ICD-10-CM | POA: Diagnosis not present

## 2020-07-13 DIAGNOSIS — Z8673 Personal history of transient ischemic attack (TIA), and cerebral infarction without residual deficits: Secondary | ICD-10-CM | POA: Diagnosis not present

## 2020-07-13 DIAGNOSIS — I739 Peripheral vascular disease, unspecified: Secondary | ICD-10-CM | POA: Diagnosis not present

## 2020-07-13 DIAGNOSIS — F32A Depression, unspecified: Secondary | ICD-10-CM | POA: Diagnosis not present

## 2020-07-13 DIAGNOSIS — Z48812 Encounter for surgical aftercare following surgery on the circulatory system: Secondary | ICD-10-CM | POA: Diagnosis not present

## 2020-07-13 DIAGNOSIS — Z7982 Long term (current) use of aspirin: Secondary | ICD-10-CM | POA: Diagnosis not present

## 2020-07-13 DIAGNOSIS — Z9181 History of falling: Secondary | ICD-10-CM | POA: Diagnosis not present

## 2020-07-15 DIAGNOSIS — Z7902 Long term (current) use of antithrombotics/antiplatelets: Secondary | ICD-10-CM | POA: Diagnosis not present

## 2020-07-15 DIAGNOSIS — Z7982 Long term (current) use of aspirin: Secondary | ICD-10-CM | POA: Diagnosis not present

## 2020-07-15 DIAGNOSIS — I739 Peripheral vascular disease, unspecified: Secondary | ICD-10-CM | POA: Diagnosis not present

## 2020-07-15 DIAGNOSIS — Z9181 History of falling: Secondary | ICD-10-CM | POA: Diagnosis not present

## 2020-07-15 DIAGNOSIS — Z48812 Encounter for surgical aftercare following surgery on the circulatory system: Secondary | ICD-10-CM | POA: Diagnosis not present

## 2020-07-15 DIAGNOSIS — F32A Depression, unspecified: Secondary | ICD-10-CM | POA: Diagnosis not present

## 2020-07-15 DIAGNOSIS — Z8673 Personal history of transient ischemic attack (TIA), and cerebral infarction without residual deficits: Secondary | ICD-10-CM | POA: Diagnosis not present

## 2020-07-15 DIAGNOSIS — F419 Anxiety disorder, unspecified: Secondary | ICD-10-CM | POA: Diagnosis not present

## 2020-07-15 DIAGNOSIS — C50919 Malignant neoplasm of unspecified site of unspecified female breast: Secondary | ICD-10-CM | POA: Diagnosis not present

## 2020-07-25 ENCOUNTER — Encounter (INDEPENDENT_AMBULATORY_CARE_PROVIDER_SITE_OTHER): Payer: Self-pay | Admitting: Nurse Practitioner

## 2020-07-25 NOTE — Progress Notes (Signed)
Subjective:    Patient ID: Joan [name], female    DOB: 1950-11-13, 70 y.o.   MRN: [id number] Chief Complaint  Patient presents with   Wound Check    2week wound check    Joan [name] is a 70 year old female that presents today for follow-up evaluation after bilateral intervention including:  PROCEDURE: 1.   Left common femoral, profunda femoris, and superficial femoral artery endarterectomies 2.   Right common femoral, profunda femoris, and superficial femoral artery endarterectomies 3.  Catheter placement into the aorta from bilateral femoral approaches 4.  Aortogram and bilateral iliofemoral angiograms 5.  Kissing balloon stent placements bilateral common iliac arteries and the distal aorta with a 7 mm diameter by 26 mm length lifestream stent on the right 7 mm diameter by 58 mm length lifestream stent on the left 6.  Additional stent placement x3 to the right common iliac artery and external iliac artery with two 6 mm diameter by 38 mm length lifestream stents and an 8 mm diameter by 8 cm length life star stent 7.  Additional stent placement x2 to the left common iliac artery with a 7 mm diameter by 58 mm length lifestream stent into the left external iliac artery with a 7 mm diameter by 75 mm length Viabahn stent   She notes that she feels good and her wound healing is doing well.  There is a slight area of dehiscence noted.  Right groin.  She notes that her walking has improved.  She denies any fevers or chills since intervention.   Review of Systems  Cardiovascular:  Positive for leg swelling.  All other systems reviewed and are negative.     Objective:   Physical Exam Vitals reviewed.  HENT:     Head: Normocephalic.  Cardiovascular:     Rate and Rhythm: Normal rate.     Pulses: Normal pulses.  Pulmonary:     Effort: Pulmonary effort is normal.  Musculoskeletal:     Right lower leg: 1+ Edema present.     Left lower leg: 1+ Edema present.  Neurological:     Mental  Status: She is alert and oriented to person, place, and time.  Psychiatric:        Mood and Affect: Mood normal.        Behavior: Behavior normal.        Thought Content: Thought content normal.        Judgment: Judgment normal.    BP (!) 148/87 (BP Location: Right Arm)   Pulse 70   Resp 16   Wt 155 lb 12.8 oz (70.7 kg)   BMI 26.74 kg/m   Past Medical History:  Diagnosis Date   Angina at rest [facility])    Anxiety    Arthritis    Breast cancer, right breast ([place])    s/p lumpectomy and adjuvant XRT   Bronchitis    Cancer ([place])    Claudication, intermittent (HCC)    Depression    Dyspnea    Environmental and seasonal allergies    GERD (gastroesophageal reflux disease)    with spicy food   Hepatitis C antibody test positive    Hypertension    Peripheral vascular disease (HCC)    Personal history of radiation therapy 2002   RIGHT lumpectomy   TIA (transient ischemic attack)    no residual. Many years ago    Social History   Socioeconomic History   Marital status: Single    Spouse name: Not on file  Number of children: Not on file   Years of education: Not on file   Highest education level: Not on file  Occupational History   Occupation: build harnesses for boats , large items  Tobacco Use   Smoking status: Every Day    Packs/day: 0.50    Years: 50.00    Pack years: 25.00    Types: Cigarettes   Smokeless tobacco: Never   Tobacco comments:    trying to quit on her own  Vaping Use   Vaping Use: Never used  Substance and Sexual Activity   Alcohol use: Not Currently    Comment: Beer heavy everyday in past - last 10 years ago   Drug use: No   Sexual activity: Not on file  Other Topics Concern   Not on file  Social History Narrative   Patient lives with her partner (handicapped). Home care to help partner out.   Partner is bedridden.    Patient has people to help her out once home.   Social Determinants of Health   Financial Resource Strain: Not on file   Food Insecurity: Not on file  Transportation Needs: Not on file  Physical Activity: Not on file  Stress: Not on file  Social Connections: Not on file  Intimate Partner Violence: Not on file    Past Surgical History:  Procedure Laterality Date   APPENDECTOMY     BREAST EXCISIONAL BIOPSY Right    11/1999   BREAST LUMPECTOMY Right 2002   RIGHT lumpectomy w/ radiation 2002   BREAST SURGERY     CHOLECYSTECTOMY     ENDARTERECTOMY FEMORAL Bilateral 06/22/2020   Procedure: ENDARTERECTOMY FEMORAL;  Surgeon: Algernon Huxley, MD;  Location: [facility] ORS;  Service: Vascular;  Laterality: Bilateral;   INSERTION OF ILIAC STENT Bilateral 06/22/2020   Procedure: INSERTION OF ILIAC STENT;  Surgeon: Algernon Huxley, MD;  Location: [facility] ORS;  Service: Vascular;  Laterality: Bilateral;   LOWER EXTREMITY ANGIOGRAPHY Left 11/14/2017   Procedure: LOWER EXTREMITY ANGIOGRAPHY;  Surgeon: Algernon Huxley, MD;  Location: [place] CV LAB;  Service: Cardiovascular;  Laterality: Left;   LOWER EXTREMITY ANGIOGRAPHY Right 04/21/2020   Procedure: LOWER EXTREMITY ANGIOGRAPHY;  Surgeon: Algernon Huxley, MD;  Location: [place] CV LAB;  Service: Cardiovascular;  Laterality: Right;    Family History  Problem Relation Age of Onset   Breast cancer Mother 85   Breast cancer Maternal Aunt    Breast cancer Maternal Grandmother    Breast cancer Sister 39    Allergies  Allergen Reactions   Seasonal Ic [Cholestatin]     Seasonal allergies    CBC Latest Ref Rng & Units 06/25/2020 06/23/2020 06/23/2020  WBC 4.0 - 10.5 K/uL 13.4(H) 13.4(H) 13.2(H)  Hemoglobin 12.0 - 15.0 g/dL 11.7(L) 12.2 11.6(L)  Hematocrit 36.0 - 46.0 % 34.8(L) 36.5 34.7(L)  Platelets 150 - 400 K/uL 228 229 216      CMP     Component Value Date/Time   NA 135 06/25/2020 0457   K 3.6 06/25/2020 0457   CL 103 06/25/2020 0457   CO2 23 06/25/2020 0457   GLUCOSE 100 (H) 06/25/2020 0457   BUN 8 06/25/2020 0457   CREATININE 0.51 06/25/2020 0457   CALCIUM 8.4  (L) 06/25/2020 0457   PROT 7.7 07/14/2013 1803   ALBUMIN 4.1 07/14/2013 1803   AST 45 (H) 07/14/2013 1803   ALT 31 07/14/2013 1803   ALKPHOS 111 07/14/2013 1803   BILITOT 1.0 07/14/2013 1803   GFRNONAA >  60 06/25/2020 0457   GFRAA >60 11/12/2017 0948     VAS [place] ABI WITH/WO TBI  Result Date: 07/15/2020  LOWER EXTREMITY DOPPLER STUDY Patient Name:  OPHIA SHAMOON  Date of Exam:   07/12/2020 Medical Rec #: [id number]       Accession #:    [id number] Date of Birth: 1950/10/05       Patient Gender: F Patient Age:   69Y Exam Location:  [facility] Procedure:      VAS [place] ABI WITH/WO TBI Referring Phys: [id number] [place] --------------------------------------------------------------------------------  Indications: Peripheral artery disease, and S/P Angioplasty with stent.  Vascular Interventions: 11/14/17: Left CIA & SFA stents with left CFA &                         bilateral EIA PTAs;                          04/21/2020:Aortogram and Selective bilateral Lower                         Extremity Angiogram. Comparison Study: 03/15/2020 Performing Technologist: Almira Coaster RVS  Examination Guidelines: A complete evaluation includes at minimum, Doppler waveform signals and systolic blood pressure reading at the level of bilateral brachial, anterior tibial, and posterior tibial arteries, when vessel segments are accessible. Bilateral testing is considered an integral part of a complete examination. Photoelectric Plethysmograph (PPG) waveforms and toe systolic pressure readings are included as required and additional duplex testing as needed. Limited examinations for reoccurring indications may be performed as noted.  ABI Findings: +---------+------------------+-----+--------+--------+ Right    Rt Pressure (mmHg)IndexWaveformComment  +---------+------------------+-----+--------+--------+ Brachial 149                                     +---------+------------------+-----+--------+--------+  ATA      85                0.57                  +---------+------------------+-----+--------+--------+ PTA      101               0.68                  +---------+------------------+-----+--------+--------+ Great Toe91                0.61                  +---------+------------------+-----+--------+--------+ +---------+------------------+-----+--------+-------+ Left     Lt Pressure (mmHg)IndexWaveformComment +---------+------------------+-----+--------+-------+ Brachial 149                                    +---------+------------------+-----+--------+-------+ ATA      79                0.53                 +---------+------------------+-----+--------+-------+ PTA      87                0.58                 +---------+------------------+-----+--------+-------+ Alison Murray  0.36                 +---------+------------------+-----+--------+-------+ +-------+-----------+-----------+------------+------------+ ABI/TBIToday's ABIToday's TBIPrevious ABIPrevious TBI +-------+-----------+-----------+------------+------------+ Right  .68        .61        .79         .52          +-------+-----------+-----------+------------+------------+ Left   .58        .36        .81         .54          +-------+-----------+-----------+------------+------------+  Bilateral ABIs appear decreased compared to prior study on 03/15/2020. Right TBIs appear increased compared to prior study on 03/15/2020. Lt TBIs appear decreased compared to prior study on 03/15/2020.  Summary: Right: Resting right ankle-brachial index indicates moderate right lower extremity arterial disease. The right toe-brachial index is abnormal. Left: Resting left ankle-brachial index indicates moderate left lower extremity arterial disease. The left toe-brachial index is abnormal.  *See table(s) above for measurements and observations.  Electronically signed by [name] MD on 07/15/2020 at  11:46:03 AM.    Final        Assessment & Plan:   1. Atherosclerosis of native artery of lower extremity with intermittent claudication, unspecified laterality ([place]) The patient has a small area of dehiscence.  We will have her apply silver Silvadene to the wound.  Otherwise she is doing well.  We will update noninvasive studies and evaluation in 4 weeks  2. Benign essential hypertension Continue antihypertensive medications as already ordered, these medications have been reviewed and there are no changes at this time.   3. Tobacco user Smoking cessation was discussed, 3-10 minutes spent on this topic specifically    Current Outpatient Medications on File Prior to Visit  Medication Sig Dispense Refill   acetaminophen (TYLENOL) 325 MG tablet Take 1-2 tablets (325-650 mg total) by mouth every 4 (four) hours as needed for mild pain (or temp >/= 101 F). 25 tablet 1   ALPRAZolam (XANAX) 0.5 MG tablet Take 0.5 mg by mouth 2 (two) times daily as needed for anxiety.  0   aspirin EC 81 MG tablet Take 81 mg by mouth daily.     clopidogrel (PLAVIX) 75 MG tablet Take 1 tablet (75 mg total) by mouth daily. 30 tablet 6   lisinopril (PRINIVIL,ZESTRIL) 20 MG tablet Take 20 mg by mouth daily.     loratadine (CLARITIN) 10 MG tablet Take 10 mg by mouth daily as needed for allergies.     rosuvastatin (CRESTOR) 5 MG tablet Take 5 mg by mouth daily.     sertraline (ZOLOFT) 100 MG tablet Take 100 mg by mouth daily.     alum & mag hydroxide-simeth (MAALOX/MYLANTA) 200-200-20 MG/5ML suspension Take 15-30 mLs by mouth every 2 (two) hours as needed for indigestion. (Patient not taking: Reported on 07/12/2020) 355 mL 0   atorvastatin (LIPITOR) 10 MG tablet Take 1 tablet (10 mg total) by mouth daily. (Patient not taking: Reported on 06/03/2020) 30 tablet 11   docusate sodium (COLACE) 100 MG capsule Take 1 capsule (100 mg total) by mouth daily. (Patient not taking: Reported on 07/12/2020) 10 capsule 0    oxyCODONE-acetaminophen (PERCOCET/ROXICET) 5-325 MG tablet Take 1-2 tablets by mouth every 4 (four) hours as needed for moderate pain. (Patient not taking: Reported on 07/12/2020) 30 tablet 0   No current facility-administered medications on file prior to visit.    There are no Patient Instructions on  file for this visit. No follow-ups on file.   Kris Hartmann, NP

## 2020-07-28 DIAGNOSIS — F3340 Major depressive disorder, recurrent, in remission, unspecified: Secondary | ICD-10-CM | POA: Diagnosis not present

## 2020-07-28 DIAGNOSIS — Z20822 Contact with and (suspected) exposure to covid-19: Secondary | ICD-10-CM | POA: Diagnosis not present

## 2020-07-28 DIAGNOSIS — J209 Acute bronchitis, unspecified: Secondary | ICD-10-CM | POA: Diagnosis not present

## 2020-07-28 DIAGNOSIS — Z853 Personal history of malignant neoplasm of breast: Secondary | ICD-10-CM | POA: Diagnosis not present

## 2020-07-28 DIAGNOSIS — I10 Essential (primary) hypertension: Secondary | ICD-10-CM | POA: Diagnosis not present

## 2020-07-28 DIAGNOSIS — R311 Benign essential microscopic hematuria: Secondary | ICD-10-CM | POA: Diagnosis not present

## 2020-07-28 DIAGNOSIS — Z720 Tobacco use: Secondary | ICD-10-CM | POA: Diagnosis not present

## 2020-07-28 DIAGNOSIS — Z889 Allergy status to unspecified drugs, medicaments and biological substances status: Secondary | ICD-10-CM | POA: Diagnosis not present

## 2020-07-28 DIAGNOSIS — I739 Peripheral vascular disease, unspecified: Secondary | ICD-10-CM | POA: Diagnosis not present

## 2020-08-04 DIAGNOSIS — Z0000 Encounter for general adult medical examination without abnormal findings: Secondary | ICD-10-CM | POA: Diagnosis not present

## 2020-08-04 DIAGNOSIS — Z853 Personal history of malignant neoplasm of breast: Secondary | ICD-10-CM | POA: Diagnosis not present

## 2020-08-04 DIAGNOSIS — Z1389 Encounter for screening for other disorder: Secondary | ICD-10-CM | POA: Diagnosis not present

## 2020-08-04 DIAGNOSIS — F32A Depression, unspecified: Secondary | ICD-10-CM | POA: Diagnosis not present

## 2020-08-04 DIAGNOSIS — I739 Peripheral vascular disease, unspecified: Secondary | ICD-10-CM | POA: Diagnosis not present

## 2020-08-04 DIAGNOSIS — I10 Essential (primary) hypertension: Secondary | ICD-10-CM | POA: Diagnosis not present

## 2020-08-09 ENCOUNTER — Other Ambulatory Visit (INDEPENDENT_AMBULATORY_CARE_PROVIDER_SITE_OTHER): Payer: Self-pay | Admitting: Nurse Practitioner

## 2020-08-09 DIAGNOSIS — I739 Peripheral vascular disease, unspecified: Secondary | ICD-10-CM

## 2020-08-12 ENCOUNTER — Ambulatory Visit (INDEPENDENT_AMBULATORY_CARE_PROVIDER_SITE_OTHER): Payer: Medicare HMO

## 2020-08-12 ENCOUNTER — Ambulatory Visit (INDEPENDENT_AMBULATORY_CARE_PROVIDER_SITE_OTHER): Payer: Medicare HMO | Admitting: Nurse Practitioner

## 2020-08-12 ENCOUNTER — Encounter (INDEPENDENT_AMBULATORY_CARE_PROVIDER_SITE_OTHER): Payer: Self-pay | Admitting: Nurse Practitioner

## 2020-08-12 ENCOUNTER — Other Ambulatory Visit: Payer: Self-pay

## 2020-08-12 ENCOUNTER — Telehealth (INDEPENDENT_AMBULATORY_CARE_PROVIDER_SITE_OTHER): Payer: Self-pay

## 2020-08-12 VITALS — BP 125/78 | HR 71 | Ht 64.0 in | Wt 163.0 lb

## 2020-08-12 DIAGNOSIS — I10 Essential (primary) hypertension: Secondary | ICD-10-CM

## 2020-08-12 DIAGNOSIS — I739 Peripheral vascular disease, unspecified: Secondary | ICD-10-CM

## 2020-08-12 DIAGNOSIS — F17200 Nicotine dependence, unspecified, uncomplicated: Secondary | ICD-10-CM

## 2020-08-12 DIAGNOSIS — I70219 Atherosclerosis of native arteries of extremities with intermittent claudication, unspecified extremity: Secondary | ICD-10-CM

## 2020-08-12 NOTE — Telephone Encounter (Signed)
Spoke with the patient and she is scheduled with Dr. Lucky Cowboy for a LLE angio 08/15/20 with a 11:00 am arrival and 08/22/20 with a 6:45 am arrival to the MM. Pre-procedure instructions were discussed and instructions for the 08/22/20 procedure will be mailed. Patient understood.

## 2020-08-12 NOTE — H&P (View-Only) (Signed)
Subjective:    Patient ID: Joan [name], female    DOB: 1950-11-13, 70 y.o.   MRN: [id number] Chief Complaint  Patient presents with   Follow-up    4 wk BLE [place]    Joan [name] is a 70 year old female that returns to the office for followup and review of the noninvasive studies.  She recently underwent extensive intervention on June 22, 2020 including:  PROCEDURE: 1.   Left common femoral, profunda femoris, and superficial femoral artery endarterectomies 2.   Right common femoral, profunda femoris, and superficial femoral artery endarterectomies 3.  Catheter placement into the aorta from bilateral femoral approaches 4.  Aortogram and bilateral iliofemoral angiograms 5.  Kissing balloon stent placements bilateral common iliac arteries and the distal aorta with a 7 mm diameter by 26 mm length lifestream stent on the right 7 mm diameter by 58 mm length lifestream stent on the left 6.  Additional stent placement x3 to the right common iliac artery and external iliac artery with two 6 mm diameter by 38 mm length lifestream stents and an 8 mm diameter by 8 cm length life star stent 7.  Additional stent placement x2 to the left common iliac artery with a 7 mm diameter by 58 mm length lifestream stent into the left external iliac artery with a 7 mm diameter by 75 mm length Viabahn stent   There has been a significant deterioration in the lower extremity symptoms.  The patient notes interval shortening of their claudication distance but no development of mild rest pain symptoms. No new ulcers or wounds have occurred since the last visit.  There have been no significant changes to the patient's overall health care.  The patient denies amaurosis fugax or recent TIA symptoms. There are no recent neurological changes noted. The patient denies history of DVT, PE or superficial thrombophlebitis. The patient denies recent episodes of angina or shortness of breath.   ABI's Rt=0.68 and Lt=0.58 (previous  ABI's Rt=0.79 and Lt=0.81) Duplex US of the lower extremity arterial system shows monophasic waveforms at the distal common femoral artery in the right lower extremity with monophasic waveforms extending throughout the entire leg.  The proximal SFA is noted to be occluded.  The left lower extremity has biphasic waveforms at the distal common femoral artery however it transitions to monophasic at the profunda.  The proximal and mid SFA is occluded with monophasic waveforms through the rest of the lower extremity.  The right toe waveforms are dampened with nearly flat left toe waveforms.   Review of Systems     Objective:   Physical Exam  BP 125/78   Pulse 71   Ht '5\' 4"'$  (1.626 m)   Wt 163 lb (73.9 kg)   BMI 27.98 kg/m   Past Medical History:  Diagnosis Date   [facility]    Anxiety    Arthritis    Breast cancer, right breast ([place])    s/p lumpectomy and adjuvant XRT   Bronchitis    Cancer ([place])    Claudication, intermittent ([facility])    Depression    Dyspnea    Environmental and seasonal allergies    GERD (gastroesophageal reflux disease)    with spicy food   Hepatitis C antibody test positive    Hypertension    Peripheral vascular disease ([place])    Personal history of radiation therapy 2002   RIGHT lumpectomy   TIA (transient ischemic attack)    no residual. Many years ago  Social History   Socioeconomic History   Marital status: Single    Spouse name: Not on file   Number of children: Not on file   Years of education: Not on file   Highest education level: Not on file  Occupational History   Occupation: build harnesses for boats , large items  Tobacco Use   Smoking status: Every Day    Packs/day: 0.50    Years: 50.00    Pack years: 25.00    Types: Cigarettes   Smokeless tobacco: Never   Tobacco comments:    trying to quit on her own  Vaping Use   Vaping Use: Never used  Substance and Sexual Activity   Alcohol use: Not Currently    Comment: Beer  heavy everyday in past - last 10 years ago   Drug use: No   Sexual activity: Not on file  Other Topics Concern   Not on file  Social History Narrative   Patient lives with her partner (handicapped). Home care to help partner out.   Partner is bedridden.    Patient has people to help her out once home.   Social Determinants of Health   Financial Resource Strain: Not on file  Food Insecurity: Not on file  Transportation Needs: Not on file  Physical Activity: Not on file  Stress: Not on file  Social Connections: Not on file  Intimate Partner Violence: Not on file    Past Surgical History:  Procedure Laterality Date   APPENDECTOMY     BREAST EXCISIONAL BIOPSY Right    11/1999   BREAST LUMPECTOMY Right 2002   RIGHT lumpectomy w/ radiation 2002   BREAST SURGERY     CHOLECYSTECTOMY     ENDARTERECTOMY FEMORAL Bilateral 06/22/2020   Procedure: ENDARTERECTOMY FEMORAL;  Surgeon: Algernon Huxley, MD;  Location: [facility] ORS;  Service: Vascular;  Laterality: Bilateral;   INSERTION OF ILIAC STENT Bilateral 06/22/2020   Procedure: INSERTION OF ILIAC STENT;  Surgeon: Algernon Huxley, MD;  Location: [facility] OR[facility];  Service: Vascular;  Laterality: Bilateral;   LOWER EXTREMITY ANGIOGRAPHY Left 11/14/2017   Procedure: LOWER EXTREMITY ANGIOGRAPHY;  Surgeon: Algernon Huxley, MD;  Location: [place] [facility];  Service: Cardiovascular;  Laterality: Left;   LOWER EXTREMITY ANGIOGRAPHY Right 04/21/2020   Procedure: LOWER EXTREMITY ANGIOGRAPHY;  Surgeon: Algernon Huxley, MD;  Location: [place] [facility];  Service: Cardiovascular;  Laterality: Right;    Family History  Problem Relation Age of Onset   Breast cancer Mother 55   Breast cancer Maternal Aunt    Breast cancer Maternal Grandmother    Breast cancer Sister 29    Allergies  Allergen Reactions   Seasonal Ic [Cholestatin]     Seasonal allergies    CBC Latest Ref Rng & Units 06/25/2020 06/23/2020 06/23/2020  WBC 4.0 - 10.5 K/uL 13.4(H) 13.4(H) 13.2(H)   Hemoglobin 12.0 - 15.0 g/dL 11.7(L) 12.2 11.6(L)  Hematocrit 36.0 - 46.0 % 34.8(L) 36.5 34.7(L)  Platelets 150 - 400 K/uL 228 229 216      CMP     Component Value Date/Time   NA 135 06/25/2020 0457   K 3.6 06/25/2020 0457   CL 103 06/25/2020 0457   CO2 23 06/25/2020 0457   GLUCOSE 100 (H) 06/25/2020 0457   BUN 8 06/25/2020 0457   CREATININE 0.51 06/25/2020 0457   CALCIUM 8.4 (L) 06/25/2020 0457   PROT 7.7 07/14/2013 1803   ALBUMIN 4.1 07/14/2013 1803   AST 45 (H) 07/14/2013 1803  ALT 31 07/14/2013 1803   ALKPHOS 111 07/14/2013 1803   BILITOT 1.0 07/14/2013 1803   GFRNONAA >60 06/25/2020 0457   GFRAA >60 11/12/2017 0948     VAS [place] ABI WITH/WO TBI  Result Date: 07/15/2020  LOWER EXTREMITY DOPPLER STUDY Patient Name:  KIMANH ROSILES  Date of Exam:   07/12/2020 Medical Rec #: [id number]       Accession #:    [id number] Date of Birth: September 06, 1950       Patient Gender: F Patient Age:   69Y Exam Location:  [facility] Procedure:      VAS [place] ABI WITH/WO TBI Referring Phys: [id number] Erskine Squibb DEW --------------------------------------------------------------------------------  Indications: Peripheral artery disease, and S/P Angioplasty with stent.  Vascular Interventions: 11/14/17: Left CIA & SFA stents with left CFA &                         bilateral EIA PTAs;                          04/21/2020:Aortogram and Selective bilateral Lower                         Extremity Angiogram. Comparison Study: 03/15/2020 Performing Technologist: Almira Coaster RVS  Examination Guidelines: A complete evaluation includes at minimum, Doppler waveform signals and systolic blood pressure reading at the level of bilateral brachial, anterior tibial, and posterior tibial arteries, when vessel segments are accessible. Bilateral testing is considered an integral part of a complete examination. Photoelectric Plethysmograph (PPG) waveforms and toe systolic pressure readings are included as required and  additional duplex testing as needed. Limited examinations for reoccurring indications may be performed as noted.  ABI Findings: +---------+------------------+-----+--------+--------+ Right    Rt Pressure (mmHg)IndexWaveformComment  +---------+------------------+-----+--------+--------+ Brachial 149                                     +---------+------------------+-----+--------+--------+ ATA      85                0.57                  +---------+------------------+-----+--------+--------+ PTA      101               0.68                  +---------+------------------+-----+--------+--------+ Great Toe91                0.61                  +---------+------------------+-----+--------+--------+ +---------+------------------+-----+--------+-------+ Left     Lt Pressure (mmHg)IndexWaveformComment +---------+------------------+-----+--------+-------+ Brachial 149                                    +---------+------------------+-----+--------+-------+ ATA      79                0.53                 +---------+------------------+-----+--------+-------+ PTA      87                0.58                 +---------+------------------+-----+--------+-------+  Great Toe54                0.36                 +---------+------------------+-----+--------+-------+ +-------+-----------+-----------+------------+------------+ ABI/TBIToday's ABIToday's TBIPrevious ABIPrevious TBI +-------+-----------+-----------+------------+------------+ Right  .68        .61        .79         .52          +-------+-----------+-----------+------------+------------+ Left   .58        .36        .81         .54          +-------+-----------+-----------+------------+------------+  Bilateral ABIs appear decreased compared to prior study on 03/15/2020. Right TBIs appear increased compared to prior study on 03/15/2020. Lt TBIs appear decreased compared to prior study on 03/15/2020.   Summary: Right: Resting right ankle-brachial index indicates moderate right lower extremity arterial disease. The right toe-brachial index is abnormal. Left: Resting left ankle-brachial index indicates moderate left lower extremity arterial disease. The left toe-brachial index is abnormal.  *See table(s) above for measurements and observations.  Electronically signed by [name] MD on 07/15/2020 at 11:46:03 AM.    Final        Assessment & Plan:   1. 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   Given the severity of the patient's lower extremity symptoms the patient should undergo angiography and intervention.  Risk and benefits were reviewed the patient.  Indications for the procedure were reviewed.  All questions were answered, the patient agrees to proceed.   The patient should continue walking and begin a more formal exercise program.  The patient should continue antiplatelet therapy and aggressive treatment of the lipid abnormalities  The patient will follow up with me after the angiogram.    2. Benign essential hypertension Continue antihypertensive medications as already ordered, these medications have been reviewed and there are no changes at this time.   3. Tobacco use disorder Patient stopped the day of her surgery and continues to abstain.   Current Outpatient Medications on File Prior to Visit  Medication Sig Dispense Refill   acetaminophen (TYLENOL) 325 MG tablet Take 1-2 tablets (325-650 mg total) by mouth every 4 (four) hours as needed for mild pain (or temp >/= 101 F). 25 tablet 1   ALPRAZolam (XANAX) 0.5 MG tablet Take 0.5 mg by mouth 2 (two) times daily as needed for anxiety.  0   alum & mag hydroxide-simeth (MAALOX/MYLANTA) 200-200-20 MG/5ML suspension Take 15-30 mLs  by mouth every 2 (two) hours as needed for indigestion. 355 mL 0   aspirin EC 81 MG tablet Take 81 mg by mouth daily.     clopidogrel (PLAVIX) 75 MG tablet Take 1 tablet (75 mg total) by mouth daily. 30 tablet 6   docusate sodium (COLACE) 100 MG capsule Take 1 capsule (100 mg total) by mouth daily. 10 capsule 0   lisinopril (PRINIVIL,ZESTRIL) 20 MG tablet Take 20 mg by mouth daily.     loratadine (CLARITIN) 10 MG tablet Take 10 mg by mouth daily as needed for allergies.     oxyCODONE-acetaminophen (PERCOCET/ROXICET) 5-325 MG tablet Take 1-2 tablets by mouth every 4 (four) hours as needed for moderate pain. 30 tablet 0   rosuvastatin (CRESTOR) 5 MG tablet Take 5  mg by mouth daily.     sertraline (ZOLOFT) 100 MG tablet Take 100 mg by mouth daily.     silver sulfADIAZINE (SILVADENE) 1 % cream Apply 1 application topically daily. 50 g 0   atorvastatin (LIPITOR) 10 MG tablet Take 1 tablet (10 mg total) by mouth daily. (Patient not taking: Reported on 06/03/2020) 30 tablet 11   No current facility-administered medications on file prior to visit.    There are no Patient Instructions on file for this visit. No follow-ups on file.   Kris Hartmann, NP

## 2020-08-12 NOTE — Progress Notes (Signed)
Subjective:    Patient ID: Joan [name], female    DOB: [birth year], 70 y.o.   MRN: [id number] Chief Complaint  Patient presents with   Follow-up    4 wk BLE [place]    Joan [name] is a 70 year old female that returns to the office for followup and review of the noninvasive studies.  She recently underwent extensive intervention on June 22, 2020 including:  PROCEDURE: 1.   Left common femoral, profunda femoris, and superficial femoral artery endarterectomies 2.   Right common femoral, profunda femoris, and superficial femoral artery endarterectomies 3.  Catheter placement into the aorta from bilateral femoral approaches 4.  Aortogram and bilateral iliofemoral angiograms 5.  Kissing balloon stent placements bilateral common iliac arteries and the distal aorta with a 7 mm diameter by 26 mm length lifestream stent on the right 7 mm diameter by 58 mm length lifestream stent on the left 6.  Additional stent placement x3 to the right common iliac artery and external iliac artery with two 6 mm diameter by 38 mm length lifestream stents and an 8 mm diameter by 8 cm length life star stent 7.  Additional stent placement x2 to the left common iliac artery with a 7 mm diameter by 58 mm length lifestream stent into the left external iliac artery with a 7 mm diameter by 75 mm length Viabahn stent   There has been a significant deterioration in the lower extremity symptoms.  The patient notes interval shortening of their claudication distance but no development of mild rest pain symptoms. No new ulcers or wounds have occurred since the last visit.  There have been no significant changes to the patient's overall health care.  The patient denies amaurosis fugax or recent TIA symptoms. There are no recent neurological changes noted. The patient denies history of DVT, PE or superficial thrombophlebitis. The patient denies recent episodes of angina or shortness of breath.   ABI's Rt=0.68 and Lt=0.58 (previous  ABI's Rt=0.79 and Lt=0.81) Duplex US of the lower extremity arterial system shows monophasic waveforms at the distal common femoral artery in the right lower extremity with monophasic waveforms extending throughout the entire leg.  The proximal SFA is noted to be occluded.  The left lower extremity has biphasic waveforms at the distal common femoral artery however it transitions to monophasic at the profunda.  The proximal and mid SFA is occluded with monophasic waveforms through the rest of the lower extremity.  The right toe waveforms are dampened with nearly flat left toe waveforms.   Review of Systems     Objective:   Physical Exam  BP 125/78   Pulse 71   Ht '5\' 4"'$  (1.626 m)   Wt 163 lb (73.9 kg)   BMI 27.98 kg/m   Past Medical History:  Diagnosis Date   Angina at rest [facility])    Anxiety    Arthritis    Breast cancer, right breast ([place])    s/p lumpectomy and adjuvant XRT   Bronchitis    Cancer ([place])    Claudication, intermittent ([facility])    Depression    Dyspnea    Environmental and seasonal allergies    GERD (gastroesophageal reflux disease)    with spicy food   Hepatitis C antibody test positive    Hypertension    Peripheral vascular disease ([place])    Personal history of radiation therapy 2002   RIGHT lumpectomy   TIA (transient ischemic attack)    no residual. Many years ago  Social History   Socioeconomic History   Marital status: Single    Spouse name: Not on file   Number of children: Not on file   Years of education: Not on file   Highest education level: Not on file  Occupational History   Occupation: build harnesses for boats , large items  Tobacco Use   Smoking status: Every Day    Packs/day: 0.50    Years: 50.00    Pack years: 25.00    Types: Cigarettes   Smokeless tobacco: Never   Tobacco comments:    trying to quit on her own  Vaping Use   Vaping Use: Never used  Substance and Sexual Activity   Alcohol use: Not Currently    Comment: Beer  heavy everyday in past - last 10 years ago   Drug use: No   Sexual activity: Not on file  Other Topics Concern   Not on file  Social History Narrative   Patient lives with her partner (handicapped). Home care to help partner out.   Partner is bedridden.    Patient has people to help her out once home.   Social Determinants of Health   Financial Resource Strain: Not on file  Food Insecurity: Not on file  Transportation Needs: Not on file  Physical Activity: Not on file  Stress: Not on file  Social Connections: Not on file  Intimate Partner Violence: Not on file    Past Surgical History:  Procedure Laterality Date   APPENDECTOMY     BREAST EXCISIONAL BIOPSY Right    11/1999   BREAST LUMPECTOMY Right 2002   RIGHT lumpectomy w/ radiation 2002   BREAST SURGERY     CHOLECYSTECTOMY     ENDARTERECTOMY FEMORAL Bilateral 06/22/2020   Procedure: ENDARTERECTOMY FEMORAL;  Surgeon: Algernon Huxley, MD;  Location: [facility] ORS;  Service: Vascular;  Laterality: Bilateral;   INSERTION OF ILIAC STENT Bilateral 06/22/2020   Procedure: INSERTION OF ILIAC STENT;  Surgeon: Algernon Huxley, MD;  Location: [facility] ORS;  Service: Vascular;  Laterality: Bilateral;   LOWER EXTREMITY ANGIOGRAPHY Left 11/14/2017   Procedure: LOWER EXTREMITY ANGIOGRAPHY;  Surgeon: Algernon Huxley, MD;  Location: [place] [facility];  Service: Cardiovascular;  Laterality: Left;   LOWER EXTREMITY ANGIOGRAPHY Right 04/21/2020   Procedure: LOWER EXTREMITY ANGIOGRAPHY;  Surgeon: Algernon Huxley, MD;  Location: [place] [facility];  Service: Cardiovascular;  Laterality: Right;    Family History  Problem Relation Age of Onset   Breast cancer Mother 10   Breast cancer Maternal Aunt    Breast cancer Maternal Grandmother    Breast cancer Sister 47    Allergies  Allergen Reactions   Seasonal Ic [Cholestatin]     Seasonal allergies    CBC Latest Ref Rng & Units 06/25/2020 06/23/2020 06/23/2020  WBC 4.0 - 10.5 K/uL 13.4(H) 13.4(H) 13.2(H)   Hemoglobin 12.0 - 15.0 g/dL 11.7(L) 12.2 11.6(L)  Hematocrit 36.0 - 46.0 % 34.8(L) 36.5 34.7(L)  Platelets 150 - 400 K/uL 228 229 216      CMP     Component Value Date/Time   NA 135 06/25/2020 0457   K 3.6 06/25/2020 0457   CL 103 06/25/2020 0457   CO2 23 06/25/2020 0457   GLUCOSE 100 (H) 06/25/2020 0457   BUN 8 06/25/2020 0457   CREATININE 0.51 06/25/2020 0457   CALCIUM 8.4 (L) 06/25/2020 0457   PROT 7.7 07/14/2013 1803   ALBUMIN 4.1 07/14/2013 1803   AST 45 (H) 07/14/2013 1803  ALT 31 07/14/2013 1803   ALKPHOS 111 07/14/2013 1803   BILITOT 1.0 07/14/2013 1803   GFRNONAA >60 06/25/2020 0457   GFRAA >60 11/12/2017 0948     VAS [place] ABI WITH/WO TBI  Result Date: 07/15/2020  LOWER EXTREMITY DOPPLER STUDY Patient Name:  Joan [name]  Date of Exam:   07/12/2020 Medical Rec #: [id number]       Accession #:    [id number] Date of Birth: 1950-05-01       Patient Gender: F Patient Age:   69Y Exam Location:  [facility] Procedure:      VAS [place] ABI WITH/WO TBI Referring Phys: [id number] Erskine Squibb DEW --------------------------------------------------------------------------------  Indications: Peripheral artery disease, and S/P Angioplasty with stent.  Vascular Interventions: 11/14/17: Left CIA & SFA stents with left CFA &                         bilateral EIA PTAs;                          04/21/2020:Aortogram and Selective bilateral Lower                         Extremity Angiogram. Comparison Study: 03/15/2020 Performing Technologist: Almira Coaster RVS  Examination Guidelines: A complete evaluation includes at minimum, Doppler waveform signals and systolic blood pressure reading at the level of bilateral brachial, anterior tibial, and posterior tibial arteries, when vessel segments are accessible. Bilateral testing is considered an integral part of a complete examination. Photoelectric Plethysmograph (PPG) waveforms and toe systolic pressure readings are included as required and  additional duplex testing as needed. Limited examinations for reoccurring indications may be performed as noted.  ABI Findings: +---------+------------------+-----+--------+--------+ Right    Rt Pressure (mmHg)IndexWaveformComment  +---------+------------------+-----+--------+--------+ Brachial 149                                     +---------+------------------+-----+--------+--------+ ATA      85                0.57                  +---------+------------------+-----+--------+--------+ PTA      101               0.68                  +---------+------------------+-----+--------+--------+ Great Toe91                0.61                  +---------+------------------+-----+--------+--------+ +---------+------------------+-----+--------+-------+ Left     Lt Pressure (mmHg)IndexWaveformComment +---------+------------------+-----+--------+-------+ Brachial 149                                    +---------+------------------+-----+--------+-------+ ATA      79                0.53                 +---------+------------------+-----+--------+-------+ PTA      87                0.58                 +---------+------------------+-----+--------+-------+  Great Toe54                0.36                 +---------+------------------+-----+--------+-------+ +-------+-----------+-----------+------------+------------+ ABI/TBIToday's ABIToday's TBIPrevious ABIPrevious TBI +-------+-----------+-----------+------------+------------+ Right  .68        .61        .79         .52          +-------+-----------+-----------+------------+------------+ Left   .58        .36        .81         .54          +-------+-----------+-----------+------------+------------+  Bilateral ABIs appear decreased compared to prior study on 03/15/2020. Right TBIs appear increased compared to prior study on 03/15/2020. Lt TBIs appear decreased compared to prior study on 03/15/2020.   Summary: Right: Resting right ankle-brachial index indicates moderate right lower extremity arterial disease. The right toe-brachial index is abnormal. Left: Resting left ankle-brachial index indicates moderate left lower extremity arterial disease. The left toe-brachial index is abnormal.  *See table(s) above for measurements and observations.  Electronically signed by [name] MD on 07/15/2020 at 11:46:03 AM.    Final        Assessment & Plan:   1. 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   Given the severity of the patient's lower extremity symptoms the patient should undergo angiography and intervention.  Risk and benefits were reviewed the patient.  Indications for the procedure were reviewed.  All questions were answered, the patient agrees to proceed.   The patient should continue walking and begin a more formal exercise program.  The patient should continue antiplatelet therapy and aggressive treatment of the lipid abnormalities  The patient will follow up with me after the angiogram.    2. Benign essential hypertension Continue antihypertensive medications as already ordered, these medications have been reviewed and there are no changes at this time.   3. Tobacco use disorder Patient stopped the day of her surgery and continues to abstain.   Current Outpatient Medications on File Prior to Visit  Medication Sig Dispense Refill   acetaminophen (TYLENOL) 325 MG tablet Take 1-2 tablets (325-650 mg total) by mouth every 4 (four) hours as needed for mild pain (or temp >/= 101 F). 25 tablet 1   ALPRAZolam (XANAX) 0.5 MG tablet Take 0.5 mg by mouth 2 (two) times daily as needed for anxiety.  0   alum & mag hydroxide-simeth (MAALOX/MYLANTA) 200-200-20 MG/5ML suspension Take 15-30 mLs  by mouth every 2 (two) hours as needed for indigestion. 355 mL 0   aspirin EC 81 MG tablet Take 81 mg by mouth daily.     clopidogrel (PLAVIX) 75 MG tablet Take 1 tablet (75 mg total) by mouth daily. 30 tablet 6   docusate sodium (COLACE) 100 MG capsule Take 1 capsule (100 mg total) by mouth daily. 10 capsule 0   lisinopril (PRINIVIL,ZESTRIL) 20 MG tablet Take 20 mg by mouth daily.     loratadine (CLARITIN) 10 MG tablet Take 10 mg by mouth daily as needed for allergies.     oxyCODONE-acetaminophen (PERCOCET/ROXICET) 5-325 MG tablet Take 1-2 tablets by mouth every 4 (four) hours as needed for moderate pain. 30 tablet 0   rosuvastatin (CRESTOR) 5 MG tablet Take 5  mg by mouth daily.     sertraline (ZOLOFT) 100 MG tablet Take 100 mg by mouth daily.     silver sulfADIAZINE (SILVADENE) 1 % cream Apply 1 application topically daily. 50 g 0   atorvastatin (LIPITOR) 10 MG tablet Take 1 tablet (10 mg total) by mouth daily. (Patient not taking: Reported on 06/03/2020) 30 tablet 11   No current facility-administered medications on file prior to visit.    There are no Patient Instructions on file for this visit. No follow-ups on file.   Kris Hartmann, NP

## 2020-08-12 NOTE — H&P (View-Only) (Signed)
Subjective:    Patient ID: Joan [name], female    DOB: [birth year], 70 y.o.   MRN: [id number] Chief Complaint  Patient presents with   Follow-up    4 wk BLE [place]    Joan [name] is a 71 year old female that returns to the office for followup and review of the noninvasive studies.  She recently underwent extensive intervention on June 22, 2020 including:  PROCEDURE: 1.   Left common femoral, profunda femoris, and superficial femoral artery endarterectomies 2.   Right common femoral, profunda femoris, and superficial femoral artery endarterectomies 3.  Catheter placement into the aorta from bilateral femoral approaches 4.  Aortogram and bilateral iliofemoral angiograms 5.  Kissing balloon stent placements bilateral common iliac arteries and the distal aorta with a 7 mm diameter by 26 mm length lifestream stent on the right 7 mm diameter by 58 mm length lifestream stent on the left 6.  Additional stent placement x3 to the right common iliac artery and external iliac artery with two 6 mm diameter by 38 mm length lifestream stents and an 8 mm diameter by 8 cm length life star stent 7.  Additional stent placement x2 to the left common iliac artery with a 7 mm diameter by 58 mm length lifestream stent into the left external iliac artery with a 7 mm diameter by 75 mm length Viabahn stent   There has been a significant deterioration in the lower extremity symptoms.  The patient notes interval shortening of their claudication distance but no development of mild rest pain symptoms. No new ulcers or wounds have occurred since the last visit.  There have been no significant changes to the patient's overall health care.  The patient denies amaurosis fugax or recent TIA symptoms. There are no recent neurological changes noted. The patient denies history of DVT, PE or superficial thrombophlebitis. The patient denies recent episodes of angina or shortness of breath.   ABI's Rt=0.68 and Lt=0.58 (previous  ABI's Rt=0.79 and Lt=0.81) Duplex US of the lower extremity arterial system shows monophasic waveforms at the distal common femoral artery in the right lower extremity with monophasic waveforms extending throughout the entire leg.  The proximal SFA is noted to be occluded.  The left lower extremity has biphasic waveforms at the distal common femoral artery however it transitions to monophasic at the profunda.  The proximal and mid SFA is occluded with monophasic waveforms through the rest of the lower extremity.  The right toe waveforms are dampened with nearly flat left toe waveforms.   Review of Systems     Objective:   Physical Exam  BP 125/78   Pulse 71   Ht '5\' 4"'$  (1.626 m)   Wt 163 lb (73.9 kg)   BMI 27.98 kg/m   Past Medical History:  Diagnosis Date   Angina at rest [facility])    Anxiety    Arthritis    Breast cancer, right breast ([place])    s/p lumpectomy and adjuvant XRT   Bronchitis    Cancer ([place])    Claudication, intermittent ([facility])    Depression    Dyspnea    Environmental and seasonal allergies    GERD (gastroesophageal reflux disease)    with spicy food   Hepatitis C antibody test positive    Hypertension    Peripheral vascular disease ([place])    Personal history of radiation therapy 2002   RIGHT lumpectomy   TIA (transient ischemic attack)    no residual. Many years ago  Social History   Socioeconomic History   Marital status: Single    Spouse name: Not on file   Number of children: Not on file   Years of education: Not on file   Highest education level: Not on file  Occupational History   Occupation: build harnesses for boats , large items  Tobacco Use   Smoking status: Every Day    Packs/day: 0.50    Years: 50.00    Pack years: 25.00    Types: Cigarettes   Smokeless tobacco: Never   Tobacco comments:    trying to quit on her own  Vaping Use   Vaping Use: Never used  Substance and Sexual Activity   Alcohol use: Not Currently    Comment: Beer  heavy everyday in past - last 10 years ago   Drug use: No   Sexual activity: Not on file  Other Topics Concern   Not on file  Social History Narrative   Patient lives with her partner (handicapped). Home care to help partner out.   Partner is bedridden.    Patient has people to help her out once home.   Social Determinants of Health   Financial Resource Strain: Not on file  Food Insecurity: Not on file  Transportation Needs: Not on file  Physical Activity: Not on file  Stress: Not on file  Social Connections: Not on file  Intimate Partner Violence: Not on file    Past Surgical History:  Procedure Laterality Date   APPENDECTOMY     BREAST EXCISIONAL BIOPSY Right    11/1999   BREAST LUMPECTOMY Right 2002   RIGHT lumpectomy w/ radiation 2002   BREAST SURGERY     CHOLECYSTECTOMY     ENDARTERECTOMY FEMORAL Bilateral 06/22/2020   Procedure: ENDARTERECTOMY FEMORAL;  Surgeon: Algernon Huxley, MD;  Location: [facility] ORS;  Service: Vascular;  Laterality: Bilateral;   INSERTION OF ILIAC STENT Bilateral 06/22/2020   Procedure: INSERTION OF ILIAC STENT;  Surgeon: Algernon Huxley, MD;  Location: [facility] ORS;  Service: Vascular;  Laterality: Bilateral;   LOWER EXTREMITY ANGIOGRAPHY Left 11/14/2017   Procedure: LOWER EXTREMITY ANGIOGRAPHY;  Surgeon: Algernon Huxley, MD;  Location: [place] [facility];  Service: Cardiovascular;  Laterality: Left;   LOWER EXTREMITY ANGIOGRAPHY Right 04/21/2020   Procedure: LOWER EXTREMITY ANGIOGRAPHY;  Surgeon: Algernon Huxley, MD;  Location: [place] [facility];  Service: Cardiovascular;  Laterality: Right;    Family History  Problem Relation Age of Onset   Breast cancer Mother 50   Breast cancer Maternal Aunt    Breast cancer Maternal Grandmother    Breast cancer Sister 69    Allergies  Allergen Reactions   Seasonal Ic [Cholestatin]     Seasonal allergies    CBC Latest Ref Rng & Units 06/25/2020 06/23/2020 06/23/2020  WBC 4.0 - 10.5 K/uL 13.4(H) 13.4(H) 13.2(H)   Hemoglobin 12.0 - 15.0 g/dL 11.7(L) 12.2 11.6(L)  Hematocrit 36.0 - 46.0 % 34.8(L) 36.5 34.7(L)  Platelets 150 - 400 K/uL 228 229 216      CMP     Component Value Date/Time   NA 135 06/25/2020 0457   K 3.6 06/25/2020 0457   CL 103 06/25/2020 0457   CO2 23 06/25/2020 0457   GLUCOSE 100 (H) 06/25/2020 0457   BUN 8 06/25/2020 0457   CREATININE 0.51 06/25/2020 0457   CALCIUM 8.4 (L) 06/25/2020 0457   PROT 7.7 07/14/2013 1803   ALBUMIN 4.1 07/14/2013 1803   AST 45 (H) 07/14/2013 1803  ALT 31 07/14/2013 1803   ALKPHOS 111 07/14/2013 1803   BILITOT 1.0 07/14/2013 1803   GFRNONAA >60 06/25/2020 0457   GFRAA >60 11/12/2017 0948     VAS [place] ABI WITH/WO TBI  Result Date: 07/15/2020  LOWER EXTREMITY DOPPLER STUDY Patient Name:  WILLEAN HORNBURG  Date of Exam:   07/12/2020 Medical Rec #: [id number]       Accession #:    [id number] Date of Birth: Oct 20, 1950       Patient Gender: F Patient Age:   69Y Exam Location:  [facility] Procedure:      VAS [place] ABI WITH/WO TBI Referring Phys: [id number] Erskine Squibb DEW --------------------------------------------------------------------------------  Indications: Peripheral artery disease, and S/P Angioplasty with stent.  Vascular Interventions: 11/14/17: Left CIA & SFA stents with left CFA &                         bilateral EIA PTAs;                          04/21/2020:Aortogram and Selective bilateral Lower                         Extremity Angiogram. Comparison Study: 03/15/2020 Performing Technologist: Almira Coaster RVS  Examination Guidelines: A complete evaluation includes at minimum, Doppler waveform signals and systolic blood pressure reading at the level of bilateral brachial, anterior tibial, and posterior tibial arteries, when vessel segments are accessible. Bilateral testing is considered an integral part of a complete examination. Photoelectric Plethysmograph (PPG) waveforms and toe systolic pressure readings are included as required and  additional duplex testing as needed. Limited examinations for reoccurring indications may be performed as noted.  ABI Findings: +---------+------------------+-----+--------+--------+ Right    Rt Pressure (mmHg)IndexWaveformComment  +---------+------------------+-----+--------+--------+ Brachial 149                                     +---------+------------------+-----+--------+--------+ ATA      85                0.57                  +---------+------------------+-----+--------+--------+ PTA      101               0.68                  +---------+------------------+-----+--------+--------+ Great Toe91                0.61                  +---------+------------------+-----+--------+--------+ +---------+------------------+-----+--------+-------+ Left     Lt Pressure (mmHg)IndexWaveformComment +---------+------------------+-----+--------+-------+ Brachial 149                                    +---------+------------------+-----+--------+-------+ ATA      79                0.53                 +---------+------------------+-----+--------+-------+ PTA      87                0.58                 +---------+------------------+-----+--------+-------+  Great Toe54                0.36                 +---------+------------------+-----+--------+-------+ +-------+-----------+-----------+------------+------------+ ABI/TBIToday's ABIToday's TBIPrevious ABIPrevious TBI +-------+-----------+-----------+------------+------------+ Right  .68        .61        .79         .52          +-------+-----------+-----------+------------+------------+ Left   .58        .36        .81         .54          +-------+-----------+-----------+------------+------------+  Bilateral ABIs appear decreased compared to prior study on 03/15/2020. Right TBIs appear increased compared to prior study on 03/15/2020. Lt TBIs appear decreased compared to prior study on 03/15/2020.   Summary: Right: Resting right ankle-brachial index indicates moderate right lower extremity arterial disease. The right toe-brachial index is abnormal. Left: Resting left ankle-brachial index indicates moderate left lower extremity arterial disease. The left toe-brachial index is abnormal.  *See table(s) above for measurements and observations.  Electronically signed by [name] MD on 07/15/2020 at 11:46:03 AM.    Final        Assessment & Plan:   1. 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   Given the severity of the patient's lower extremity symptoms the patient should undergo angiography and intervention.  Risk and benefits were reviewed the patient.  Indications for the procedure were reviewed.  All questions were answered, the patient agrees to proceed.   The patient should continue walking and begin a more formal exercise program.  The patient should continue antiplatelet therapy and aggressive treatment of the lipid abnormalities  The patient will follow up with me after the angiogram.    2. Benign essential hypertension Continue antihypertensive medications as already ordered, these medications have been reviewed and there are no changes at this time.   3. Tobacco use disorder Patient stopped the day of her surgery and continues to abstain.   Current Outpatient Medications on File Prior to Visit  Medication Sig Dispense Refill   acetaminophen (TYLENOL) 325 MG tablet Take 1-2 tablets (325-650 mg total) by mouth every 4 (four) hours as needed for mild pain (or temp >/= 101 F). 25 tablet 1   ALPRAZolam (XANAX) 0.5 MG tablet Take 0.5 mg by mouth 2 (two) times daily as needed for anxiety.  0   alum & mag hydroxide-simeth (MAALOX/MYLANTA) 200-200-20 MG/5ML suspension Take 15-30 mLs  by mouth every 2 (two) hours as needed for indigestion. 355 mL 0   aspirin EC 81 MG tablet Take 81 mg by mouth daily.     clopidogrel (PLAVIX) 75 MG tablet Take 1 tablet (75 mg total) by mouth daily. 30 tablet 6   docusate sodium (COLACE) 100 MG capsule Take 1 capsule (100 mg total) by mouth daily. 10 capsule 0   lisinopril (PRINIVIL,ZESTRIL) 20 MG tablet Take 20 mg by mouth daily.     loratadine (CLARITIN) 10 MG tablet Take 10 mg by mouth daily as needed for allergies.     oxyCODONE-acetaminophen (PERCOCET/ROXICET) 5-325 MG tablet Take 1-2 tablets by mouth every 4 (four) hours as needed for moderate pain. 30 tablet 0   rosuvastatin (CRESTOR) 5 MG tablet Take 5  mg by mouth daily.     sertraline (ZOLOFT) 100 MG tablet Take 100 mg by mouth daily.     silver sulfADIAZINE (SILVADENE) 1 % cream Apply 1 application topically daily. 50 g 0   atorvastatin (LIPITOR) 10 MG tablet Take 1 tablet (10 mg total) by mouth daily. (Patient not taking: Reported on 06/03/2020) 30 tablet 11   No current facility-administered medications on file prior to visit.    There are no Patient Instructions on file for this visit. No follow-ups on file.   Kris Hartmann, NP

## 2020-08-15 ENCOUNTER — Other Ambulatory Visit: Payer: Self-pay

## 2020-08-15 ENCOUNTER — Ambulatory Visit
Admission: RE | Admit: 2020-08-15 | Discharge: 2020-08-15 | Disposition: A | Discharge status: Home / Self Care | Payer: Medicare HMO | Attending: Vascular Surgery | Admitting: Vascular Surgery

## 2020-08-15 ENCOUNTER — Other Ambulatory Visit (INDEPENDENT_AMBULATORY_CARE_PROVIDER_SITE_OTHER): Payer: Self-pay | Admitting: Nurse Practitioner

## 2020-08-15 ENCOUNTER — Encounter
Admission: RE | Disposition: A | Discharge status: Home / Self Care | Payer: Self-pay | Source: Home / Self Care | Attending: Vascular Surgery

## 2020-08-15 DIAGNOSIS — I10 Essential (primary) hypertension: Secondary | ICD-10-CM | POA: Diagnosis not present

## 2020-08-15 DIAGNOSIS — Z803 Family history of malignant neoplasm of breast: Secondary | ICD-10-CM | POA: Insufficient documentation

## 2020-08-15 DIAGNOSIS — I70223 Atherosclerosis of native arteries of extremities with rest pain, bilateral legs: Secondary | ICD-10-CM | POA: Insufficient documentation

## 2020-08-15 DIAGNOSIS — Z8673 Personal history of transient ischemic attack (TIA), and cerebral infarction without residual deficits: Secondary | ICD-10-CM | POA: Diagnosis not present

## 2020-08-15 DIAGNOSIS — F17210 Nicotine dependence, cigarettes, uncomplicated: Secondary | ICD-10-CM | POA: Insufficient documentation

## 2020-08-15 DIAGNOSIS — Z7902 Long term (current) use of antithrombotics/antiplatelets: Secondary | ICD-10-CM | POA: Insufficient documentation

## 2020-08-15 DIAGNOSIS — I70219 Atherosclerosis of native arteries of extremities with intermittent claudication, unspecified extremity: Secondary | ICD-10-CM

## 2020-08-15 DIAGNOSIS — Z9049 Acquired absence of other specified parts of digestive tract: Secondary | ICD-10-CM | POA: Diagnosis not present

## 2020-08-15 DIAGNOSIS — Z79899 Other long term (current) drug therapy: Secondary | ICD-10-CM | POA: Insufficient documentation

## 2020-08-15 DIAGNOSIS — Z853 Personal history of malignant neoplasm of breast: Secondary | ICD-10-CM | POA: Diagnosis not present

## 2020-08-15 DIAGNOSIS — Z7982 Long term (current) use of aspirin: Secondary | ICD-10-CM | POA: Insufficient documentation

## 2020-08-15 DIAGNOSIS — I70222 Atherosclerosis of native arteries of extremities with rest pain, left leg: Secondary | ICD-10-CM | POA: Diagnosis not present

## 2020-08-15 HISTORY — PX: LOWER EXTREMITY ANGIOGRAPHY: CATH118251

## 2020-08-15 LAB — CREATININE, SERUM
Creatinine, Ser: 0.88 mg/dL (ref 0.44–1.00)
GFR, Estimated: >60 mL/min (ref >60)

## 2020-08-15 LAB — BUN: BUN: 14 mg/dL (ref 8–23)

## 2020-08-15 SURGERY — LOWER EXTREMITY ANGIOGRAPHY
Anesthesia: Moderate Sedation | Site: Leg Lower | Laterality: Left

## 2020-08-15 MED ORDER — DIPHENHYDRAMINE HCL 50 MG/ML IJ SOLN: 50.0000 mg | Freq: Once | INTRAMUSCULAR | Status: DC | PRN

## 2020-08-15 MED ORDER — HYDROMORPHONE HCL 1 MG/ML IJ SOLN: 1.0000 mg | Freq: Once | INTRAMUSCULAR | Status: DC | PRN

## 2020-08-15 MED ORDER — CEFAZOLIN SODIUM-DEXTROSE 2-4 GM/100ML-% IV SOLN
2.0000 g | Freq: Once | INTRAVENOUS | Status: AC
  Administered 2020-08-15: 2 g via INTRAVENOUS

## 2020-08-15 MED ORDER — FAMOTIDINE 20 MG PO TABS: 40.0000 mg | ORAL_TABLET | Freq: Once | ORAL | Status: DC | PRN

## 2020-08-15 MED ORDER — FENTANYL CITRATE (PF) 100 MCG/2ML IJ SOLN
INTRAMUSCULAR | Status: DC | PRN
  Administered 2020-08-15: 50 ug via INTRAVENOUS
  Administered 2020-08-15 (×2): 25 ug via INTRAVENOUS

## 2020-08-15 MED ORDER — IODIXANOL 320 MG/ML IV SOLN
INTRAVENOUS | Status: DC | PRN
  Administered 2020-08-15: 65 mL via INTRA_ARTERIAL

## 2020-08-15 MED ORDER — HEPARIN SODIUM (PORCINE) 1000 UNIT/ML IJ SOLN
INTRAMUSCULAR | Status: AC
  Filled 2020-08-15: qty 1

## 2020-08-15 MED ORDER — FENTANYL CITRATE (PF) 100 MCG/2ML IJ SOLN
INTRAMUSCULAR | Status: AC
  Filled 2020-08-15: qty 2

## 2020-08-15 MED ORDER — SODIUM CHLORIDE 0.9 % IV SOLN: INTRAVENOUS | Status: DC

## 2020-08-15 MED ORDER — ONDANSETRON HCL 4 MG/2ML IJ SOLN: 4.0000 mg | Freq: Four times a day (QID) | INTRAMUSCULAR | Status: DC | PRN

## 2020-08-15 MED ORDER — MIDAZOLAM HCL 2 MG/2ML IJ SOLN
INTRAMUSCULAR | Status: DC | PRN
  Administered 2020-08-15 (×2): 1 mg via INTRAVENOUS
  Administered 2020-08-15: 2 mg via INTRAVENOUS

## 2020-08-15 MED ORDER — MIDAZOLAM HCL 2 MG/ML PO SYRP: 8.0000 mg | ORAL_SOLUTION | Freq: Once | ORAL | Status: DC | PRN

## 2020-08-15 MED ORDER — MIDAZOLAM HCL 5 MG/5ML IJ SOLN
INTRAMUSCULAR | Status: AC
  Filled 2020-08-15: qty 5

## 2020-08-15 MED ORDER — HEPARIN SODIUM (PORCINE) 1000 UNIT/ML IJ SOLN
INTRAMUSCULAR | Status: DC | PRN
  Administered 2020-08-15: 5000 [IU] via INTRAVENOUS

## 2020-08-15 MED ORDER — METHYLPREDNISOLONE SODIUM SUCC 125 MG IJ SOLR: 125.0000 mg | Freq: Once | INTRAMUSCULAR | Status: DC | PRN

## 2020-08-15 SURGICAL SUPPLY — 28 items
BALLN DORADO 5X200X135 (BALLOONS) ×2
BALLN LUTONIX 018 4X150X130 (BALLOONS) ×2
BALLN LUTONIX 018 5X100X130 (BALLOONS) ×2
BALLN LUTONIX 018 5X300X130 (BALLOONS) ×2
BALLOON DORADO 5X200X135 (BALLOONS) ×1 IMPLANT
BALLOON LUTONIX 018 4X150X130 (BALLOONS) ×1 IMPLANT
BALLOON LUTONIX 018 5X100X130 (BALLOONS) ×1 IMPLANT
BALLOON LUTONIX 018 5X300X130 (BALLOONS) ×1 IMPLANT
CANNULA 5F STIFF (CANNULA) ×2 IMPLANT
CATH ANGIO 5F PIGTAIL 65CM (CATHETERS) ×2 IMPLANT
CATH BEACON 5 .035 65 KMP TIP (CATHETERS) ×2 IMPLANT
CATH ROTAREX 135 6FR (CATHETERS) ×2 IMPLANT
CATH TEMPO 5F RIM 65CM (CATHETERS) ×2 IMPLANT
CATH VERT 5X100 (CATHETERS) ×2 IMPLANT
CATH VS15FR (CATHETERS) ×2 IMPLANT
COVER PROBE U/S 5X48 (MISCELLANEOUS) ×2 IMPLANT
DEVICE STARCLOSE SE CLOSURE (Vascular Products) ×2 IMPLANT
GLIDEWIRE ADV .035X260CM (WIRE) ×2 IMPLANT
KIT ENCORE 26 ADVANTAGE (KITS) ×2 IMPLANT
PACK ANGIOGRAPHY (CUSTOM PROCEDURE TRAY) ×2 IMPLANT
SHEATH ANL2 6FRX45 HC (SHEATH) ×2 IMPLANT
SHEATH BRITE TIP 5FRX11 (SHEATH) ×2 IMPLANT
STENT VIABAHN 6X100X120 (Permanent Stent) ×2 IMPLANT
STENT VIABAHN 6X250X120 (Permanent Stent) ×2 IMPLANT
SYR MEDRAD MARK 7 150ML (SYRINGE) ×2 IMPLANT
TUBING CONTRAST HIGH PRESS 72 (TUBING) ×2 IMPLANT
WIRE G V18X300CM (WIRE) ×2 IMPLANT
WIRE GUIDERIGHT .035X150 (WIRE) ×2 IMPLANT

## 2020-08-15 NOTE — Interval H&P Note (Signed)
History and Physical Interval Note:  08/15/2020 11:10 AM  Joan [name]  has presented today for surgery, with the diagnosis of LLE Angio   BARD   ASO w claudication.  The various methods of treatment have been discussed with the patient and family. After consideration of risks, benefits and other options for treatment, the patient has consented to  Procedure(s): LOWER EXTREMITY ANGIOGRAPHY (Left) as a surgical intervention.  The patient's history has been reviewed, patient examined, no change in status, stable for surgery.  I have reviewed the patient's chart and labs.  Questions were answered to the patient's satisfaction.     [name]

## 2020-08-15 NOTE — Op Note (Signed)
[facility]  Percutaneous Study/Intervention Procedural Note   Date of Surgery: 08/15/2020  Surgeon(s):Bryson Gavia    Assistants:none  Pre-operative Diagnosis: PAD with rest pain BLE  Post-operative diagnosis:  Same  Procedure(s) Performed:             1.  Ultrasound guidance for vascular access right femoral artery             2.  Catheter placement into left common femoral artery from right femoral approach             3.  Aortogram and selective left lower extremity angiogram             4.  Mechanical thrombectomy with the [place] Rex device in the left SFA and popliteal arteries             5.  Percutaneous transluminal angioplasty of the left SFA and popliteal arteries with 5 mm diameter by 30 cm length Lutonix drug-coated angioplasty balloon  6.  Percutaneous transluminal angioplasty of the left distal SFA and tibioperoneal trunk with 4 mm diameter by 15 cm length Lutonix drug-coated angioplasty balloon  7.  Viabahn stent placement x2 to the left SFA and popliteal arteries with 6 mm diameter by 10 cm length and 6 mm diameter by 25 cm length Viabahn stents             8.  StarClose closure device right femoral artery  EBL: 5 cc  Contrast: 65 cc  Fluoro Time: 19.7 minutes  Moderate Conscious Sedation Time: approximately 66 minutes using 4 mg of Versed and 100 mcg of Fentanyl              Indications:  Patient is a 70 y.o.female with severe peripheral arterial disease and multiple previous interventions.  She had femoral endarterectomies and iliac stents placed several weeks ago, but still has significant pain. The patient has noninvasive study showing reduced ABIs.  There was concern there could be issues with her iliac interventions, her femoral endarterectomy, or recurrent SFA occlusions. The patient is brought in for angiography for further evaluation and potential treatment.  Due to the limb threatening nature of the situation, angiogram was performed for  attempted limb salvage. The patient is aware that if the procedure fails, amputation would be expected.  The patient also understands that even with successful revascularization, amputation may still be required due to the severity of the situation.  Risks and benefits are discussed and informed consent is obtained.   Procedure:  The patient was identified and appropriate procedural time out was performed.  The patient was then placed supine on the table and prepped and draped in the usual sterile fashion. Moderate conscious sedation was administered during a face to face encounter with the patient throughout the procedure with my supervision of the RN administering medicines and monitoring the patient's vital signs, pulse oximetry, telemetry and mental status throughout from the start of the procedure until the patient was taken to the recovery room. Ultrasound was used to evaluate the right common femoral artery.  It was patent .  A digital ultrasound image was acquired.  A Seldinger needle was used to access the right common femoral artery under direct ultrasound guidance and a permanent image was performed.  A 0.035 J wire was advanced without resistance and a 5Fr sheath was placed.  Pigtail catheter was placed into the aorta and an AP aortogram was performed. This demonstrated normal renal arteries and normal aorta and iliac  segments without significant stenosis after previous stenting there were now widely patent. I then crossed the aortic bifurcation with a V S1 catheter and an advantage wire and advanced to the left femoral head. Selective left lower extremity angiogram was then performed. This demonstrated that the common femoral artery and profunda femoris artery were widely patent.  The SFA had about a 1 cm stump and then occluded abruptly a couple centimeters above the previously placed stent.  The above-knee popliteal artery reconstituted, but was diseased in the midsegment as well as the distal  popliteal artery and proximal tibioperoneal trunk with both areas having greater than 60% stenosis.  The anterior tibial artery was the best runoff distally.  The peroneal artery provided a good second runoff vessel.  The posterior tibial artery was small and did not really continue to the foot. It was felt that it was in the patient's best interest to proceed with intervention after these images to avoid a second procedure and a larger amount of contrast and fluoroscopy based off of the findings from the initial angiogram. The patient was systemically heparinized and a 6 [place] Ansell sheath was then placed over the [employer] wire. I then used a Kumpe catheter and the advantage wire to navigate through the SFA and popliteal disease and down into the tibioperoneal trunk and then exchanged for a V 18 wire which was parked in the posterior tibial artery.  I began by using the [place] Rex device performing mechanical thrombectomy to debulk the chronic thrombus in the SFA and above-knee popliteal artery largely within the previously placed stents.  Following passes with the [place] Rex device, there is no channel of blood flow but there remained marked narrowing throughout.  A 5 mm diameter by 30 cm length Lutonix drug-coated angioplasty balloon was inflated from the origin of the SFA down to the mid popliteal artery and inflated to 8 atm for 1 minute.  A 4 mm diameter by 15 cm length Lutonix drug-coated angioplasty balloon was inflated in the below-knee popliteal artery and tibioperoneal trunk and taken up to 10 atm for 1 minute.  Completion imaging showed significant residual disease below the previously placed stent, in the midportion of the previously placed stent, and in the proximal SFA just above the previously placed stent with greater than 50% stenosis in the areas.  I elected to place stents.  A 6 mm diameter by 10 cm length Viabahn stent was deployed from the origin of the SFA down into the previously placed  stents and then a 6 mm diameter by 25 cm length Viabahn stent was deployed bridging from the stent down to the above-knee popliteal artery.  These were postdilated with 5 mm balloon with excellent angiographic completion result and less than 10% residual stenosis.  There is also less than 20% residual stenosis in the below-knee popliteal artery and tibioperoneal trunk and the areas treated with angioplasty. I elected to terminate the procedure. The sheath was removed and StarClose closure device was deployed in the right femoral artery with excellent hemostatic result. The patient was taken to the recovery room in stable condition having tolerated the procedure well.  Findings:               Aortogram:  This demonstrated normal renal arteries and normal aorta and iliac segments without significant stenosis after previous stenting there were now widely patent.             Left Lower Extremity: The common femoral artery and profunda  femoris artery were widely patent.  The SFA had about a 1 cm stump and then occluded abruptly a couple centimeters above the previously placed stent.  The above-knee popliteal artery reconstituted, but was diseased in the midsegment as well as the distal popliteal artery and proximal tibioperoneal trunk with both areas having greater than 60% stenosis.  The anterior tibial artery was the best runoff distally.  The peroneal artery provided a good second runoff vessel.  The posterior tibial artery was small and did not really continue to the foot.   Disposition: Patient was taken to the recovery room in stable condition having tolerated the procedure well.  Complications: None  [name] 08/15/2020 1:52 PM   This note was created with Dragon Medical transcription system. Any errors in dictation are purely unintentional.

## 2020-08-16 ENCOUNTER — Encounter: Payer: Self-pay | Admitting: Vascular Surgery

## 2020-08-18 NOTE — Interval H&P Note (Signed)
History and Physical Interval Note:  08/18/2020 3:06 PM  Joan [name]  has presented today for surgery, with the diagnosis of LLE Angio   BARD   ASO w claudication.  The various methods of treatment have been discussed with the patient and family. After consideration of risks, benefits and other options for treatment, the patient has consented to  Procedure(s): LOWER EXTREMITY ANGIOGRAPHY (Left) as a surgical intervention.  The patient's history has been reviewed, patient examined, no change in status, stable for surgery.  I have reviewed the patient's chart and labs.  Questions were answered to the patient's satisfaction.   Heart RRR Lungs CTAB ROS Positive for leg pain, arthritis, SOB with exertion Other systems reviewed and negative   [name]

## 2020-08-22 ENCOUNTER — Other Ambulatory Visit: Payer: Self-pay

## 2020-08-22 ENCOUNTER — Other Ambulatory Visit (INDEPENDENT_AMBULATORY_CARE_PROVIDER_SITE_OTHER): Payer: Self-pay | Admitting: Nurse Practitioner

## 2020-08-22 ENCOUNTER — Encounter: Payer: Self-pay | Admitting: Vascular Surgery

## 2020-08-22 ENCOUNTER — Encounter
Admission: RE | Disposition: A | Discharge status: Home / Self Care | Payer: Self-pay | Source: Home / Self Care | Attending: Vascular Surgery

## 2020-08-22 ENCOUNTER — Ambulatory Visit
Admission: RE | Admit: 2020-08-22 | Discharge: 2020-08-22 | Disposition: A | Discharge status: Home / Self Care | Payer: Medicare HMO | Attending: Vascular Surgery | Admitting: Vascular Surgery

## 2020-08-22 DIAGNOSIS — Z7982 Long term (current) use of aspirin: Secondary | ICD-10-CM | POA: Diagnosis not present

## 2020-08-22 DIAGNOSIS — F17210 Nicotine dependence, cigarettes, uncomplicated: Secondary | ICD-10-CM | POA: Insufficient documentation

## 2020-08-22 DIAGNOSIS — I70221 Atherosclerosis of native arteries of extremities with rest pain, right leg: Secondary | ICD-10-CM | POA: Insufficient documentation

## 2020-08-22 DIAGNOSIS — Z79899 Other long term (current) drug therapy: Secondary | ICD-10-CM | POA: Diagnosis not present

## 2020-08-22 DIAGNOSIS — I10 Essential (primary) hypertension: Secondary | ICD-10-CM | POA: Diagnosis not present

## 2020-08-22 DIAGNOSIS — I70211 Atherosclerosis of native arteries of extremities with intermittent claudication, right leg: Secondary | ICD-10-CM | POA: Diagnosis not present

## 2020-08-22 DIAGNOSIS — Z7902 Long term (current) use of antithrombotics/antiplatelets: Secondary | ICD-10-CM | POA: Diagnosis not present

## 2020-08-22 DIAGNOSIS — I70219 Atherosclerosis of native arteries of extremities with intermittent claudication, unspecified extremity: Secondary | ICD-10-CM

## 2020-08-22 HISTORY — PX: LOWER EXTREMITY ANGIOGRAPHY: CATH118251

## 2020-08-22 LAB — CREATININE, SERUM
Creatinine, Ser: 0.7 mg/dL (ref 0.44–1.00)
GFR, Estimated: >60 mL/min (ref >60)

## 2020-08-22 LAB — BUN: BUN: 11 mg/dL (ref 8–23)

## 2020-08-22 SURGERY — LOWER EXTREMITY ANGIOGRAPHY
Anesthesia: Moderate Sedation | Site: Leg Lower | Laterality: Right

## 2020-08-22 MED ORDER — MIDAZOLAM HCL 2 MG/2ML IJ SOLN
INTRAMUSCULAR | Status: DC | PRN
  Administered 2020-08-22 (×2): 2 mg via INTRAVENOUS
  Administered 2020-08-22: 1 mg via INTRAVENOUS

## 2020-08-22 MED ORDER — CEFAZOLIN SODIUM-DEXTROSE 2-4 GM/100ML-% IV SOLN
2.0000 g | Freq: Once | INTRAVENOUS | Status: AC
  Administered 2020-08-22: 2 g via INTRAVENOUS

## 2020-08-22 MED ORDER — FAMOTIDINE 20 MG PO TABS: 40.0000 mg | ORAL_TABLET | Freq: Once | ORAL | Status: DC | PRN

## 2020-08-22 MED ORDER — HEPARIN SODIUM (PORCINE) 1000 UNIT/ML IJ SOLN
INTRAMUSCULAR | Status: DC | PRN
  Administered 2020-08-22: 5000 [IU] via INTRAVENOUS

## 2020-08-22 MED ORDER — METHYLPREDNISOLONE SODIUM SUCC 125 MG IJ SOLR: 125.0000 mg | Freq: Once | INTRAMUSCULAR | Status: DC | PRN

## 2020-08-22 MED ORDER — FENTANYL CITRATE (PF) 100 MCG/2ML IJ SOLN
INTRAMUSCULAR | Status: DC | PRN
  Administered 2020-08-22 (×2): 25 ug via INTRAVENOUS
  Administered 2020-08-22: 50 ug via INTRAVENOUS

## 2020-08-22 MED ORDER — FENTANYL CITRATE (PF) 100 MCG/2ML IJ SOLN
INTRAMUSCULAR | Status: AC
  Filled 2020-08-22: qty 2

## 2020-08-22 MED ORDER — MIDAZOLAM HCL 5 MG/5ML IJ SOLN
INTRAMUSCULAR | Status: AC
  Filled 2020-08-22: qty 5

## 2020-08-22 MED ORDER — HEPARIN SODIUM (PORCINE) 1000 UNIT/ML IJ SOLN
INTRAMUSCULAR | Status: AC
  Filled 2020-08-22: qty 1

## 2020-08-22 MED ORDER — ONDANSETRON HCL 4 MG/2ML IJ SOLN: 4.0000 mg | Freq: Four times a day (QID) | INTRAMUSCULAR | Status: DC | PRN

## 2020-08-22 MED ORDER — HYDROMORPHONE HCL 1 MG/ML IJ SOLN: 1.0000 mg | Freq: Once | INTRAMUSCULAR | Status: DC | PRN

## 2020-08-22 MED ORDER — MIDAZOLAM HCL 2 MG/ML PO SYRP: 8.0000 mg | ORAL_SOLUTION | Freq: Once | ORAL | Status: DC | PRN

## 2020-08-22 MED ORDER — IODIXANOL 320 MG/ML IV SOLN
INTRAVENOUS | Status: DC | PRN
  Administered 2020-08-22: 80 mL via INTRA_ARTERIAL

## 2020-08-22 MED ORDER — DIPHENHYDRAMINE HCL 50 MG/ML IJ SOLN: 50.0000 mg | Freq: Once | INTRAMUSCULAR | Status: DC | PRN

## 2020-08-22 MED ORDER — SODIUM CHLORIDE 0.9 % IV SOLN: INTRAVENOUS | Status: DC

## 2020-08-22 SURGICAL SUPPLY — 32 items
BALLN LUTONIX 018 4X220X130 (BALLOONS) ×2
BALLN LUTONIX 018 5X300X130 (BALLOONS) ×2
BALLN LUTONIX 018 5X60X130 (BALLOONS) ×2
BALLN LUTONIX 5X220X130 (BALLOONS) ×2
BALLN LUTONIX DCB 5X40X130 (BALLOONS) ×2
BALLN ULTRVRSE 3X80X150 (BALLOONS) ×1
BALLN ULTRVRSE 3X80X150 OTW (BALLOONS) ×1
BALLOON LUTONIX 018 4X220X130 (BALLOONS) ×1 IMPLANT
BALLOON LUTONIX 018 5X300X130 (BALLOONS) ×1 IMPLANT
BALLOON LUTONIX 018 5X60X130 (BALLOONS) ×1 IMPLANT
BALLOON LUTONIX 5X220X130 (BALLOONS) ×1 IMPLANT
BALLOON LUTONIX DCB 5X40X130 (BALLOONS) ×1 IMPLANT
BALLOON ULTRVRSE 3X80X150 OTW (BALLOONS) ×1 IMPLANT
CATH ANGIO 5F PIGTAIL 65CM (CATHETERS) ×2 IMPLANT
CATH BEACON 5 .035 65 KMP TIP (CATHETERS) ×2 IMPLANT
CATH BEACON 5 .035 65 RIM TIP (CATHETERS) ×2 IMPLANT
CATH VERT 5X100 (CATHETERS) ×2 IMPLANT
COVER PROBE U/S 5X48 (MISCELLANEOUS) ×2 IMPLANT
DEVICE STARCLOSE SE CLOSURE (Vascular Products) ×2 IMPLANT
DEVICE TORQUE (MISCELLANEOUS) ×2 IMPLANT
GLIDEWIRE ADV .035X260CM (WIRE) ×2 IMPLANT
KIT ENCORE 26 ADVANTAGE (KITS) ×2 IMPLANT
PACK ANGIOGRAPHY (CUSTOM PROCEDURE TRAY) ×2 IMPLANT
SHEATH ANL2 6FRX45 HC (SHEATH) ×2 IMPLANT
SHEATH BRITE TIP 5FRX11 (SHEATH) ×2 IMPLANT
STENT VIABAHN 6X250X120 (Permanent Stent) ×2 IMPLANT
STENT VIABAHN 6X50X120 (Permanent Stent) ×2 IMPLANT
STENT VIABAHN 6X7.5X120 (Permanent Stent) ×2 IMPLANT
SYR MEDRAD MARK 7 150ML (SYRINGE) ×2 IMPLANT
TUBING CONTRAST HIGH PRESS 72 (TUBING) ×2 IMPLANT
WIRE G V18X300CM (WIRE) ×2 IMPLANT
WIRE GUIDERIGHT .035X150 (WIRE) ×2 IMPLANT

## 2020-08-22 NOTE — Interval H&P Note (Signed)
History and Physical Interval Note:  08/22/2020 9:50 AM  Joan [name]  has presented today for surgery, with the diagnosis of RLE Angio   BARD   ASO w claudication.  The various methods of treatment have been discussed with the patient and family. After consideration of risks, benefits and other options for treatment, the patient has consented to  Procedure(s): LOWER EXTREMITY ANGIOGRAPHY (Right) as a surgical intervention.  The patient's history has been reviewed, patient examined, no change in status, stable for surgery.  I have reviewed the patient's chart and labs.  Questions were answered to the patient's satisfaction.     [name]

## 2020-08-22 NOTE — Op Note (Signed)
[name]  Percutaneous Study/Intervention Procedural Note   Date of Surgery: 08/22/2020  Surgeon(s):Jamoni Hewes    Assistants:none  Pre-operative Diagnosis: PAD with claudication BLE  Post-operative diagnosis:  Same  Procedure(s) Performed:             1.  Ultrasound guidance for vascular access left femoral artery             2.  Catheter placement into right common femoral artery from left femoral approach             3.  Aortogram and selective right lower extremity angiogram             4.  Percutaneous transluminal angioplasty of right profunda femoris artery with 5 mm diameter Lutonix drug-coated angioplasty balloon             5.  Percutaneous transluminal angioplasty of right anterior tibial artery with 3 mm diameter angioplasty balloon  6.  Percutaneous transluminal angioplasty of the entire SFA and popliteal artery to the mid segment with 4 mm diameter Lutonix drug-coated angioplasty balloon distally and 5 mm diameter Lutonix drug-coated angioplasty balloon proximally  7.  Stent placement x3 to the right SFA and popliteal arteries using 6 mm diameter by 25 cm length Viabahn stent, 6 mm diameter by 7.5 cm length Viabahn stent, and a 6 mm diameter by 5 cm length Viabahn stent             8.  StarClose closure device left femoral artery  EBL: 10 cc  Contrast: 80 cc  Fluoro Time: 18.1 minutes  Moderate Conscious Sedation Time: approximately 87 minutes using 5 mg of Versed and 100 mcg of Fentanyl              Indications:  Patient is a 70 y.o.female with claudication symptoms status post multiple previous interventions. The patient has noninvasive study showing reduced ABIs bilaterally.  She has already undergone left lower extremity revascularization with good results. The patient is brought in for angiography for further evaluation and potential treatment. Risks and benefits are discussed and informed consent is obtained.   Procedure:  The patient was  identified and appropriate procedural time out was performed.  The patient was then placed supine on the table and prepped and draped in the usual sterile fashion. Moderate conscious sedation was administered during a face to face encounter with the patient throughout the procedure with my supervision of the RN administering medicines and monitoring the patient's vital signs, pulse oximetry, telemetry and mental status throughout from the start of the procedure until the patient was taken to the recovery room. Ultrasound was used to evaluate the left common femoral artery.  It was patent .  A digital ultrasound image was acquired.  A Seldinger needle was used to access the left common femoral artery under direct ultrasound guidance and a permanent image was performed.  A 0.035 J wire was advanced without resistance and a 5Fr sheath was placed.  Pigtail catheter was placed into the aorta and an AP aortogram was performed. This demonstrated normal renal arteries and normal aorta and iliac segments without significant stenosis. I then crossed the aortic bifurcation and advanced to the right femoral head. Selective right lower extremity angiogram was then performed. This demonstrated right common femoral artery status post endarterectomy was widely patent, but there was about an 80% stenosis in the profunda femoris artery and a flush occlusion of the SFA.  There was reconstitution of the distal SFA/above-knee  popliteal artery at Hunter's canal.  There was then a typical tibial trifurcation with moderate disease of the proximal portion of the anterior tibial artery in the 60 to 70% range.  Other than that, the tibial vessels were continuous and patent distally. It was felt that it was in the patient's best interest to proceed with intervention after these images to avoid a second procedure and a larger amount of contrast and fluoroscopy based off of the findings from the initial angiogram. The patient was systemically  heparinized and a 6 [place] Ansell sheath was then placed over the [employer] wire. I then used a Kumpe catheter and the advantage wire to first get across the stenosis in the profunda femoris artery to help his collateral flow.  A 5 mm diameter by 4 cm length Lutonix drug-coated angioplasty balloon was inflated to 10 atm for 1 minute.  Completion imaging showed about a 20 to 30% residual stenosis.  I then turned my attention to the flush SFA occlusion.  Tediously, I was able to gain access to the SFA occlusion using a rim catheter and an advantage wire.  With a mild amount of difficulty, I was able to cross the occlusion and confirm intraluminal flow in the popliteal artery.  I then used the advantage wire and the Kumpe catheter was able to get across the anterior tibial artery in the proximal segment and exchanged for a V 18 wire.  A 4 cm diameter by 22 cm length Lutonix drug-coated angioplasty balloon was inflated from the mid popliteal artery up to the mid SFA.  A 5 mm diameter by 22 cm length Lutonix drug-coated angioplasty balloon was inflated up to 6 atm for 1 minute in the proximal to mid SFA.  Completion imaging showed diffuse residual disease throughout the SFA and popliteal arteries of greater than 50% residual stenosis.  I started with a 6 mm diameter by 25 cm length Viabahn stent and deployed this from the proximal popliteal artery up to the proximal SFA.  Proximally, up to the origin of the SFA I used a 6 mm diameter by 7.5 cm length Viabahn stent to the origin of the SFA.  An additional 6 mm diameter by 5 cm Viabahn stent was required in the popliteal artery above the main.  I postdilated this with a 5 mm diameter Lutonix drug-coated angioplasty balloon with less than 10% residual stenosis.  I then addressed the anterior tibial lesion with a 3 mm diameter by 8 cm length angioplasty balloon inflated to 5 atm for 1 minute.  Completion imaging there showed only about 15 to 20% residual stenosis. I  elected to terminate the procedure. The sheath was removed and StarClose closure device was deployed in the left femoral artery with excellent hemostatic result. The patient was taken to the recovery room in stable condition having tolerated the procedure well.  Findings:               Aortogram: Renal arteries are normal.  The aorta and iliac arteries were now patent with multiple stents in the iliac arteries.             Right lower Extremity: Right common femoral artery status post endarterectomy was widely patent, but there was about an 80% stenosis in the profunda femoris artery and a flush occlusion of the SFA.  There was reconstitution of the distal SFA/above-knee popliteal artery at Hunter's canal.  There was then a typical tibial trifurcation with moderate disease of the proximal portion of  the anterior tibial artery in the 60 to 70% range.  Other than that, the tibial vessels were continuous and patent distally.   Disposition: Patient was taken to the recovery room in stable condition having tolerated the procedure well.  Complications: None  Joan [name] 08/22/2020 9:53 AM   This note was created with Dragon Medical transcription system. Any errors in dictation are purely unintentional.

## 2020-08-24 ENCOUNTER — Telehealth (INDEPENDENT_AMBULATORY_CARE_PROVIDER_SITE_OTHER): Payer: Self-pay | Admitting: Vascular Surgery

## 2020-08-24 ENCOUNTER — Other Ambulatory Visit (INDEPENDENT_AMBULATORY_CARE_PROVIDER_SITE_OTHER): Payer: Self-pay | Admitting: Nurse Practitioner

## 2020-08-24 MED ORDER — OXYCODONE-ACETAMINOPHEN 5-325 MG PO TABS: 1.0000 | ORAL_TABLET | Freq: Four times a day (QID) | ORAL | 0 refills | Status: DC | PRN

## 2020-08-24 NOTE — Telephone Encounter (Signed)
Called to request pain medication. Patient states right le is in pain and tylenol is not helping. I spoke with FB and she agreed to send over pain medication to her pharmacy ([facility] [address]) and advised me to make patient aware that if pain hasn't subsided in 3-4 days to give [place] a call so that we can get her in with studies. Patient agreed.   This note is for documentation purposes only .

## 2020-08-29 NOTE — Telephone Encounter (Signed)
Called to inform us that she has been taking the medication prescribed and is still in pain. Per phone note I scheduled patient to come in this week with studies per FB.  This note is for documentation purposes.

## 2020-08-30 ENCOUNTER — Other Ambulatory Visit (INDEPENDENT_AMBULATORY_CARE_PROVIDER_SITE_OTHER): Payer: Self-pay | Admitting: Nurse Practitioner

## 2020-08-30 DIAGNOSIS — M79604 Pain in right leg: Secondary | ICD-10-CM

## 2020-08-30 DIAGNOSIS — I70213 Atherosclerosis of native arteries of extremities with intermittent claudication, bilateral legs: Secondary | ICD-10-CM

## 2020-08-30 DIAGNOSIS — Z95820 Peripheral vascular angioplasty status with implants and grafts: Secondary | ICD-10-CM

## 2020-08-31 ENCOUNTER — Ambulatory Visit (INDEPENDENT_AMBULATORY_CARE_PROVIDER_SITE_OTHER): Payer: Medicare HMO

## 2020-08-31 ENCOUNTER — Encounter (INDEPENDENT_AMBULATORY_CARE_PROVIDER_SITE_OTHER): Payer: Self-pay | Admitting: Nurse Practitioner

## 2020-08-31 ENCOUNTER — Other Ambulatory Visit: Payer: Self-pay

## 2020-08-31 ENCOUNTER — Ambulatory Visit (INDEPENDENT_AMBULATORY_CARE_PROVIDER_SITE_OTHER): Payer: Medicare HMO | Admitting: Nurse Practitioner

## 2020-08-31 VITALS — BP 129/78 | HR 66 | Resp 16 | Wt 159.6 lb

## 2020-08-31 DIAGNOSIS — I70213 Atherosclerosis of native arteries of extremities with intermittent claudication, bilateral legs: Secondary | ICD-10-CM

## 2020-08-31 DIAGNOSIS — Z95820 Peripheral vascular angioplasty status with implants and grafts: Secondary | ICD-10-CM

## 2020-08-31 DIAGNOSIS — M79604 Pain in right leg: Secondary | ICD-10-CM

## 2020-08-31 DIAGNOSIS — I10 Essential (primary) hypertension: Secondary | ICD-10-CM

## 2020-08-31 DIAGNOSIS — I739 Peripheral vascular disease, unspecified: Secondary | ICD-10-CM

## 2020-09-05 ENCOUNTER — Encounter (INDEPENDENT_AMBULATORY_CARE_PROVIDER_SITE_OTHER): Payer: Self-pay | Admitting: Nurse Practitioner

## 2020-09-05 NOTE — Progress Notes (Signed)
Subjective:    Patient ID: Joan [name], female    DOB: [birth year], 70 y.o.   MRN: [id number] Chief Complaint  Patient presents with   Follow-up    Patient still in pain from le angio    Joan [name] is a 70 year old female that returns today for follow-up evaluation after recent right lower extremity angiogram.  The patient has had extensive intervention as of lately.  Following bilateral endarterectomies the patient had some restenosis and occlusion of her SFA.  The patient underwent intervention including:    Procedure(s) Performed:             1.  Ultrasound guidance for vascular access left femoral artery             2.  Catheter placement into right common femoral artery from left femoral approach             3.  Aortogram and selective right lower extremity angiogram             4.  Percutaneous transluminal angioplasty of right profunda femoris artery with 5 mm diameter Lutonix drug-coated angioplasty balloon             5.  Percutaneous transluminal angioplasty of right anterior tibial artery with 3 mm diameter angioplasty balloon             6.  Percutaneous transluminal angioplasty of the entire SFA and popliteal artery to the mid segment with 4 mm diameter Lutonix drug-coated angioplasty balloon distally and 5 mm diameter Lutonix drug-coated angioplasty balloon proximally             7.  Stent placement x3 to the right SFA and popliteal arteries using 6 mm diameter by 25 cm length Viabahn stent, 6 mm diameter by 7.5 cm length Viabahn stent, and a 6 mm diameter by 5 cm length Viabahn stent             8.  StarClose closure device left femoral artery  The patient had severe pain following intervention.  This is despite use of pain medication.  Because of the continued pain the patient had there was concern for early reocclusion.  The patient notes that today however the pain has subsided greatly and is now tolerable.  The patient's bilateral groin wounds from her endarterectomies  are also healed.  Today noninvasive studies show an ABI of 0.95 on the right and zero 8.5 on the left.  The previous ABI was 0.70 on the right and 0.63 on the left.  The patient has TBI of 0.46 on the right and 0.72 on the left previously was 0.30 on the right and 0.33 on the left.  The patient has monophasic tibial artery waveforms with dampened toe waveforms on the right however the left has triphasic waveforms with good toe waveforms.   Review of Systems  Cardiovascular:  Positive for leg swelling.  Musculoskeletal:  Positive for myalgias.  All other systems reviewed and are negative.     Objective:   Physical Exam Vitals reviewed.  HENT:     Head: Normocephalic.  Cardiovascular:     Rate and Rhythm: Normal rate.     Pulses: Decreased pulses.  Pulmonary:     Effort: Pulmonary effort is normal.  Skin:    General: Skin is warm and dry.  Neurological:     Mental Status: She is alert and oriented to person, place, and time.  Psychiatric:        Mood and  Affect: Mood normal.        Behavior: Behavior normal.        Thought Content: Thought content normal.        Judgment: Judgment normal.    BP 129/78 (BP Location: Right Arm)   Pulse 66   Resp 16   Wt 159 lb 9.6 oz (72.4 kg)   BMI 27.40 kg/m   Past Medical History:  Diagnosis Date   Angina at rest [facility])    Anxiety    Arthritis    Breast cancer, right breast ([place])    s/p lumpectomy and adjuvant XRT   Bronchitis    Cancer ([place])    Claudication, intermittent (HCC)    Depression    Dyspnea    Environmental and seasonal allergies    GERD (gastroesophageal reflux disease)    with spicy food   Hepatitis C antibody test positive    Hypertension    Peripheral vascular disease (HCC)    Personal history of radiation therapy 2002   RIGHT lumpectomy   TIA (transient ischemic attack)    no residual. Many years ago    Social History   Socioeconomic History   Marital status: Single    Spouse name: Not on file    Number of children: Not on file   Years of education: Not on file   Highest education level: Not on file  Occupational History   Occupation: build harnesses for boats , large items  Tobacco Use   Smoking status: Former    Packs/day: 0.50    Years: 50.00    Pack years: 25.00    Types: Cigarettes   Smokeless tobacco: Never   Tobacco comments:    trying to quit on her own  Vaping Use   Vaping Use: Never used  Substance and Sexual Activity   Alcohol use: Not Currently    Comment: Beer heavy everyday in past - last 10 years ago   Drug use: No   Sexual activity: Not on file  Other Topics Concern   Not on file  Social History Narrative   Patient lives with her partner (handicapped). Home care to help partner out.   Partner is bedridden.    Patient has people to help her out once home.   Social Determinants of Health   Financial Resource Strain: Not on file  Food Insecurity: Not on file  Transportation Needs: Not on file  Physical Activity: Not on file  Stress: Not on file  Social Connections: Not on file  Intimate Partner Violence: Not on file    Past Surgical History:  Procedure Laterality Date   APPENDECTOMY     BREAST EXCISIONAL BIOPSY Right    11/1999   BREAST LUMPECTOMY Right 2002   RIGHT lumpectomy w/ radiation 2002   BREAST SURGERY     CHOLECYSTECTOMY     ENDARTERECTOMY FEMORAL Bilateral 06/22/2020   Procedure: ENDARTERECTOMY FEMORAL;  Surgeon: Algernon Huxley, MD;  Location: [facility] ORS;  Service: Vascular;  Laterality: Bilateral;   INSERTION OF ILIAC STENT Bilateral 06/22/2020   Procedure: INSERTION OF ILIAC STENT;  Surgeon: Algernon Huxley, MD;  Location: [facility] OR[facility];  Service: Vascular;  Laterality: Bilateral;   LOWER EXTREMITY ANGIOGRAPHY Left 11/14/2017   Procedure: LOWER EXTREMITY ANGIOGRAPHY;  Surgeon: Algernon Huxley, MD;  Location: [place] [facility];  Service: Cardiovascular;  Laterality: Left;   LOWER EXTREMITY ANGIOGRAPHY Right 04/21/2020   Procedure: LOWER EXTREMITY  ANGIOGRAPHY;  Surgeon: Algernon Huxley, MD;  Location: [place]  [facility];  Service: Cardiovascular;  Laterality: Right;   LOWER EXTREMITY ANGIOGRAPHY Left 08/15/2020   Procedure: LOWER EXTREMITY ANGIOGRAPHY;  Surgeon: Algernon Huxley, MD;  Location: [place] [facility];  Service: Cardiovascular;  Laterality: Left;   LOWER EXTREMITY ANGIOGRAPHY Right 08/22/2020   Procedure: LOWER EXTREMITY ANGIOGRAPHY;  Surgeon: Algernon Huxley, MD;  Location: [place] [facility];  Service: Cardiovascular;  Laterality: Right;    Family History  Problem Relation Age of Onset   Breast cancer Mother 52   Breast cancer Maternal Aunt    Breast cancer Maternal Grandmother    Breast cancer Sister 90    Allergies  Allergen Reactions   Seasonal Ic [Cholestatin]     Seasonal allergies    CBC Latest Ref Rng & Units 06/25/2020 06/23/2020 06/23/2020  WBC 4.0 - 10.5 K/uL 13.4(H) 13.4(H) 13.2(H)  Hemoglobin 12.0 - 15.0 g/dL 11.7(L) 12.2 11.6(L)  Hematocrit 36.0 - 46.0 % 34.8(L) 36.5 34.7(L)  Platelets 150 - 400 K/uL 228 229 216      CMP     Component Value Date/Time   NA 135 06/25/2020 0457   K 3.6 06/25/2020 0457   CL 103 06/25/2020 0457   CO2 23 06/25/2020 0457   GLUCOSE 100 (H) 06/25/2020 0457   BUN 11 08/22/2020 0719   CREATININE 0.70 08/22/2020 0719   CALCIUM 8.4 (L) 06/25/2020 0457   PROT 7.7 07/14/2013 1803   ALBUMIN 4.1 07/14/2013 1803   AST 45 (H) 07/14/2013 1803   ALT 31 07/14/2013 1803   ALKPHOS 111 07/14/2013 1803   BILITOT 1.0 07/14/2013 1803   GFRNONAA >60 08/22/2020 0719   GFRAA >60 11/12/2017 0948     VAS [place] ABI WITH/WO TBI  Result Date: 08/16/2020  LOWER EXTREMITY DOPPLER STUDY Patient Name:  LARKYN RABIN  Date of Exam:   08/12/2020 Medical Rec #: [id number]       Accession #:    [id number] Date of Birth: 11-12-1950       Patient Gender: F Patient Age:   69Y Exam Location:  [facility] Procedure:      VAS [place] ABI WITH/WO TBI Referring Phys: [id number] Lucretia Roers Khaden Gater  --------------------------------------------------------------------------------  Indications: Peripheral artery disease, and S/P Angioplasty with stent.  Vascular Interventions: 11/14/17: Left CIA & SFA stents with left CFA &                         bilateral EIA PTAs;                          04/21/2020:Aortogram and Selective bilateral Lower                         Extremity Angiogram. Comparison Study: 07/12/2020 Performing Technologist: Almira Coaster RVS  Examination Guidelines: A complete evaluation includes at minimum, Doppler waveform signals and systolic blood pressure reading at the level of bilateral brachial, anterior tibial, and posterior tibial arteries, when vessel segments are accessible. Bilateral testing is considered an integral part of a complete examination. Photoelectric Plethysmograph (PPG) waveforms and toe systolic pressure readings are included as required and additional duplex testing as needed. Limited examinations for reoccurring indications may be performed as noted.  ABI Findings: +---------+------------------+-----+----------+--------+ Right    Rt Pressure (mmHg)IndexWaveform  Comment  +---------+------------------+-----+----------+--------+ Brachial 116                                       +---------+------------------+-----+----------+--------+  ATA      77                0.61 monophasic         +---------+------------------+-----+----------+--------+ PTA      88                0.70 monophasic         +---------+------------------+-----+----------+--------+ Great Toe38                0.30 Abnormal           +---------+------------------+-----+----------+--------+ +---------+------------------+-----+----------+-------+ Left     Lt Pressure (mmHg)IndexWaveform  Comment +---------+------------------+-----+----------+-------+ Brachial 126                                      +---------+------------------+-----+----------+-------+ ATA      66                 0.52 monophasic        +---------+------------------+-----+----------+-------+ PTA      80                0.63 monophasic        +---------+------------------+-----+----------+-------+ Great Toe42                0.33 Abnormal          +---------+------------------+-----+----------+-------+ +-------+-----------+-----------+------------+------------+ ABI/TBIToday's ABIToday's TBIPrevious ABIPrevious TBI +-------+-----------+-----------+------------+------------+ Right  .70        .30        .68         .61          +-------+-----------+-----------+------------+------------+ Left   .63        .33        .58         .36          +-------+-----------+-----------+------------+------------+  Bilateral ABIs appear increased compared to prior study on 07/12/2020. Bilateral TBIs appear decreased compared to prior study on 07/12/2020.  Summary: Right: Resting right ankle-brachial index indicates moderate right lower extremity arterial disease. The right toe-brachial index is abnormal. Left: Resting left ankle-brachial index indicates moderate left lower extremity arterial disease. The left toe-brachial index is abnormal.  *See table(s) above for measurements and observations.  Electronically signed by [name] MD on 08/16/2020 at 5:02:27 PM.    Final    VAS [place] ABI WITH/WO TBI  Result Date: 07/15/2020  LOWER EXTREMITY DOPPLER STUDY Patient Name:  ELINE CHAPARRO  Date of Exam:   07/12/2020 Medical Rec #: [id number]       Accession #:    [id number] Date of Birth: [birth year]       Patient Gender: F Patient Age:   69Y Exam Location:  [facility] Procedure:      VAS [place] ABI WITH/WO TBI Referring Phys: [id number] [place] --------------------------------------------------------------------------------  Indications: Peripheral artery disease, and S/P Angioplasty with stent.  Vascular Interventions: 11/14/17: Left CIA & SFA stents with left CFA &                          bilateral EIA PTAs;                          04/21/2020:Aortogram and Selective bilateral Lower  Extremity Angiogram. Comparison Study: 03/15/2020 Performing Technologist: Almira Coaster RVS  Examination Guidelines: A complete evaluation includes at minimum, Doppler waveform signals and systolic blood pressure reading at the level of bilateral brachial, anterior tibial, and posterior tibial arteries, when vessel segments are accessible. Bilateral testing is considered an integral part of a complete examination. Photoelectric Plethysmograph (PPG) waveforms and toe systolic pressure readings are included as required and additional duplex testing as needed. Limited examinations for reoccurring indications may be performed as noted.  ABI Findings: +---------+------------------+-----+--------+--------+ Right    Rt Pressure (mmHg)IndexWaveformComment  +---------+------------------+-----+--------+--------+ Brachial 149                                     +---------+------------------+-----+--------+--------+ ATA      85                0.57                  +---------+------------------+-----+--------+--------+ PTA      101               0.68                  +---------+------------------+-----+--------+--------+ Great Toe91                0.61                  +---------+------------------+-----+--------+--------+ +---------+------------------+-----+--------+-------+ Left     Lt Pressure (mmHg)IndexWaveformComment +---------+------------------+-----+--------+-------+ Brachial 149                                    +---------+------------------+-----+--------+-------+ ATA      79                0.53                 +---------+------------------+-----+--------+-------+ PTA      87                0.58                 +---------+------------------+-----+--------+-------+ Great Toe54                0.36                  +---------+------------------+-----+--------+-------+ +-------+-----------+-----------+------------+------------+ ABI/TBIToday's ABIToday's TBIPrevious ABIPrevious TBI +-------+-----------+-----------+------------+------------+ Right  .68        .61        .79         .52          +-------+-----------+-----------+------------+------------+ Left   .58        .36        .81         .54          +-------+-----------+-----------+------------+------------+  Bilateral ABIs appear decreased compared to prior study on 03/15/2020. Right TBIs appear increased compared to prior study on 03/15/2020. Lt TBIs appear decreased compared to prior study on 03/15/2020.  Summary: Right: Resting right ankle-brachial index indicates moderate right lower extremity arterial disease. The right toe-brachial index is abnormal. Left: Resting left ankle-brachial index indicates moderate left lower extremity arterial disease. The left toe-brachial index is abnormal.  *See table(s) above for measurements and observations.  Electronically signed by [name] MD on 07/15/2020 at 11:46:03 AM.    Final  Assessment & Plan:   1. PAD (peripheral artery disease) (HCC)  Recommend:  The patient has evidence of atherosclerosis of the lower extremities with claudication.  The patient does not voice lifestyle limiting changes at this point in time.  Noninvasive studies do not suggest clinically significant change.  No invasive studies, angiography or surgery at this time The patient should continue walking and begin a more formal exercise program.  The patient should continue antiplatelet therapy and aggressive treatment of the lipid abnormalities  No changes in the patient's medications at this time  The patient should continue wearing graduated compression socks 10-15 mmHg strength to control the mild edema.    2. Benign essential hypertension Continue antihypertensive medications as already ordered, these  medications have been reviewed and there are no changes at this time.    Current Outpatient Medications on File Prior to Visit  Medication Sig Dispense Refill   acetaminophen (TYLENOL) 325 MG tablet Take 1-2 tablets (325-650 mg total) by mouth every 4 (four) hours as needed for mild pain (or temp >/= 101 F). 25 tablet 1   ALPRAZolam (XANAX) 0.5 MG tablet Take 0.5 mg by mouth 2 (two) times daily as needed for anxiety.  0   aspirin EC 81 MG tablet Take 81 mg by mouth daily.     clopidogrel (PLAVIX) 75 MG tablet Take 1 tablet (75 mg total) by mouth daily. 30 tablet 6   lisinopril (PRINIVIL,ZESTRIL) 20 MG tablet Take 20 mg by mouth daily.     loratadine (CLARITIN) 10 MG tablet Take 10 mg by mouth daily as needed for allergies.     oxyCODONE-acetaminophen (PERCOCET/ROXICET) 5-325 MG tablet Take 1 tablet by mouth every 6 (six) hours as needed for moderate pain. 30 tablet 0   rosuvastatin (CRESTOR) 5 MG tablet Take 5 mg by mouth daily.     sertraline (ZOLOFT) 100 MG tablet Take 100 mg by mouth daily.     alum & mag hydroxide-simeth (MAALOX/MYLANTA) 200-200-20 MG/5ML suspension Take 15-30 mLs by mouth every 2 (two) hours as needed for indigestion. (Patient not taking: No sig reported) 355 mL 0   atorvastatin (LIPITOR) 10 MG tablet Take 1 tablet (10 mg total) by mouth daily. (Patient not taking: Reported on 06/03/2020) 30 tablet 11   docusate sodium (COLACE) 100 MG capsule Take 1 capsule (100 mg total) by mouth daily. (Patient not taking: No sig reported) 10 capsule 0   silver sulfADIAZINE (SILVADENE) 1 % cream Apply 1 application topically daily. (Patient not taking: No sig reported) 50 g 0   No current facility-administered medications on file prior to visit.    There are no Patient Instructions on file for this visit. No follow-ups on file.   Kris Hartmann, NP

## 2020-09-13 ENCOUNTER — Encounter (INDEPENDENT_AMBULATORY_CARE_PROVIDER_SITE_OTHER): Payer: Medicare HMO

## 2020-09-13 ENCOUNTER — Ambulatory Visit (INDEPENDENT_AMBULATORY_CARE_PROVIDER_SITE_OTHER): Payer: Medicare HMO | Admitting: Vascular Surgery

## 2020-09-13 ENCOUNTER — Encounter (INDEPENDENT_AMBULATORY_CARE_PROVIDER_SITE_OTHER): Payer: Self-pay

## 2020-10-04 ENCOUNTER — Ambulatory Visit (INDEPENDENT_AMBULATORY_CARE_PROVIDER_SITE_OTHER): Payer: Medicare HMO | Admitting: Vascular Surgery

## 2020-10-04 ENCOUNTER — Encounter (INDEPENDENT_AMBULATORY_CARE_PROVIDER_SITE_OTHER): Payer: Medicare HMO

## 2020-11-14 DIAGNOSIS — J189 Pneumonia, unspecified organism: Secondary | ICD-10-CM | POA: Diagnosis not present

## 2020-11-14 DIAGNOSIS — F32A Depression, unspecified: Secondary | ICD-10-CM | POA: Diagnosis not present

## 2020-11-14 DIAGNOSIS — J209 Acute bronchitis, unspecified: Secondary | ICD-10-CM | POA: Diagnosis not present

## 2020-11-14 DIAGNOSIS — Z853 Personal history of malignant neoplasm of breast: Secondary | ICD-10-CM | POA: Diagnosis not present

## 2020-11-14 DIAGNOSIS — I10 Essential (primary) hypertension: Secondary | ICD-10-CM | POA: Diagnosis not present

## 2020-11-14 DIAGNOSIS — I739 Peripheral vascular disease, unspecified: Secondary | ICD-10-CM | POA: Diagnosis not present

## 2020-11-28 ENCOUNTER — Other Ambulatory Visit (INDEPENDENT_AMBULATORY_CARE_PROVIDER_SITE_OTHER): Payer: Self-pay | Admitting: Vascular Surgery

## 2020-11-28 DIAGNOSIS — Z9889 Other specified postprocedural states: Secondary | ICD-10-CM

## 2020-12-02 ENCOUNTER — Ambulatory Visit (INDEPENDENT_AMBULATORY_CARE_PROVIDER_SITE_OTHER): Payer: Medicare HMO | Admitting: Vascular Surgery

## 2020-12-02 ENCOUNTER — Encounter (INDEPENDENT_AMBULATORY_CARE_PROVIDER_SITE_OTHER): Payer: Medicare HMO

## 2020-12-26 DIAGNOSIS — I739 Peripheral vascular disease, unspecified: Secondary | ICD-10-CM | POA: Diagnosis not present

## 2020-12-26 DIAGNOSIS — J019 Acute sinusitis, unspecified: Secondary | ICD-10-CM | POA: Diagnosis not present

## 2020-12-26 DIAGNOSIS — Z853 Personal history of malignant neoplasm of breast: Secondary | ICD-10-CM | POA: Diagnosis not present

## 2020-12-26 DIAGNOSIS — I10 Essential (primary) hypertension: Secondary | ICD-10-CM | POA: Diagnosis not present

## 2020-12-26 DIAGNOSIS — J309 Allergic rhinitis, unspecified: Secondary | ICD-10-CM | POA: Diagnosis not present

## 2020-12-26 DIAGNOSIS — J069 Acute upper respiratory infection, unspecified: Secondary | ICD-10-CM | POA: Diagnosis not present

## 2021-01-03 ENCOUNTER — Ambulatory Visit (INDEPENDENT_AMBULATORY_CARE_PROVIDER_SITE_OTHER): Payer: Medicare HMO

## 2021-01-03 ENCOUNTER — Other Ambulatory Visit: Payer: Self-pay

## 2021-01-03 ENCOUNTER — Encounter (INDEPENDENT_AMBULATORY_CARE_PROVIDER_SITE_OTHER): Payer: Self-pay | Admitting: Vascular Surgery

## 2021-01-03 ENCOUNTER — Ambulatory Visit (INDEPENDENT_AMBULATORY_CARE_PROVIDER_SITE_OTHER): Payer: Medicare HMO | Admitting: Vascular Surgery

## 2021-01-03 VITALS — BP 136/78 | HR 76 | Resp 16 | Wt 171.0 lb

## 2021-01-03 DIAGNOSIS — I70213 Atherosclerosis of native arteries of extremities with intermittent claudication, bilateral legs: Secondary | ICD-10-CM

## 2021-01-03 DIAGNOSIS — I739 Peripheral vascular disease, unspecified: Secondary | ICD-10-CM

## 2021-01-03 DIAGNOSIS — Z9889 Other specified postprocedural states: Secondary | ICD-10-CM

## 2021-01-03 DIAGNOSIS — I10 Essential (primary) hypertension: Secondary | ICD-10-CM

## 2021-01-03 NOTE — Assessment & Plan Note (Signed)
ABIs today are 1.03 bilaterally with multiphasic waveforms and normal digital pressures consistent with no arterial insufficiency after multiple previous surgeries and interventions.  Doing well.  Continue current medical regimen.  She has stopped smoking and is congratulated on this.  Recheck in 6 months.

## 2021-01-03 NOTE — Assessment & Plan Note (Signed)
blood pressure control important in reducing the progression of atherosclerotic disease. On appropriate oral medications.  

## 2021-01-03 NOTE — Progress Notes (Signed)
MRN : [id number]  Joan [name] is a 70 y.o. (04/11/1950) female who presents with chief complaint of  Chief Complaint  Patient presents with   Follow-up    Ultrasound follow up  .  History of Present Illness: Patient returns today in follow up of her PAD.  She has undergone previous interventions and surgeries to both lower extremities to improve disabling claudication and rest pain symptoms.  She is doing well.  She stopped smoking.  She has no ulceration, infection, rest pain, or lifestyle limiting claudication at this point. ABIs today are 1.03 bilaterally with multiphasic waveforms and normal digital pressures consistent with no arterial insufficiency after multiple previous surgeries and interventions.  Current Outpatient Medications  Medication Sig Dispense Refill   acetaminophen (TYLENOL) 325 MG tablet Take 1-2 tablets (325-650 mg total) by mouth every 4 (four) hours as needed for mild pain (or temp >/= 101 F). 25 tablet 1   ALPRAZolam (XANAX) 0.5 MG tablet Take 0.5 mg by mouth 2 (two) times daily as needed for anxiety.  0   aspirin EC 81 MG tablet Take 81 mg by mouth daily.     clopidogrel (PLAVIX) 75 MG tablet Take 1 tablet (75 mg total) by mouth daily. 30 tablet 6   lisinopril (PRINIVIL,ZESTRIL) 20 MG tablet Take 20 mg by mouth daily.     loratadine (CLARITIN) 10 MG tablet Take 10 mg by mouth daily as needed for allergies.     oxyCODONE-acetaminophen (PERCOCET/ROXICET) 5-325 MG tablet Take 1 tablet by mouth every 6 (six) hours as needed for moderate pain. 30 tablet 0   rosuvastatin (CRESTOR) 5 MG tablet Take 5 mg by mouth daily.     sertraline (ZOLOFT) 100 MG tablet Take 100 mg by mouth daily.     alum & mag hydroxide-simeth (MAALOX/MYLANTA) 200-200-20 MG/5ML suspension Take 15-30 mLs by mouth every 2 (two) hours as needed for indigestion. (Patient not taking: Reported on 08/15/2020) 355 mL 0   atorvastatin (LIPITOR) 10 MG tablet Take 1 tablet (10 mg total) by mouth daily.  (Patient not taking: Reported on 06/03/2020) 30 tablet 11   docusate sodium (COLACE) 100 MG capsule Take 1 capsule (100 mg total) by mouth daily. (Patient not taking: Reported on 08/15/2020) 10 capsule 0   silver sulfADIAZINE (SILVADENE) 1 % cream Apply 1 application topically daily. (Patient not taking: Reported on 08/15/2020) 50 g 0   No current facility-administered medications for this visit.    Past Medical History:  Diagnosis Date   Angina at rest [facility])    Anxiety    Arthritis    Breast cancer, right breast ([place])    s/p lumpectomy and adjuvant XRT   Bronchitis    Cancer ([place])    Claudication, intermittent ([place])    Depression    Dyspnea    Environmental and seasonal allergies    GERD (gastroesophageal reflux disease)    with spicy food   Hepatitis C antibody test positive    Hypertension    Peripheral vascular disease ([place])    Personal history of radiation therapy 2002   RIGHT lumpectomy   TIA (transient ischemic attack)    no residual. Many years ago    Past Surgical History:  Procedure Laterality Date   APPENDECTOMY     BREAST EXCISIONAL BIOPSY Right    11/1999   BREAST LUMPECTOMY Right 2002   RIGHT lumpectomy w/ radiation 2002   BREAST SURGERY     CHOLECYSTECTOMY     ENDARTERECTOMY FEMORAL Bilateral  06/22/2020   Procedure: ENDARTERECTOMY FEMORAL;  Surgeon: Algernon Huxley, MD;  Location: [facility] ORS;  Service: Vascular;  Laterality: Bilateral;   INSERTION OF ILIAC STENT Bilateral 06/22/2020   Procedure: INSERTION OF ILIAC STENT;  Surgeon: Algernon Huxley, MD;  Location: [facility] ORS;  Service: Vascular;  Laterality: Bilateral;   LOWER EXTREMITY ANGIOGRAPHY Left 11/14/2017   Procedure: LOWER EXTREMITY ANGIOGRAPHY;  Surgeon: Algernon Huxley, MD;  Location: [place] [facility];  Service: Cardiovascular;  Laterality: Left;   LOWER EXTREMITY ANGIOGRAPHY Right 04/21/2020   Procedure: LOWER EXTREMITY ANGIOGRAPHY;  Surgeon: Algernon Huxley, MD;  Location: [place] [facility];  Service:  Cardiovascular;  Laterality: Right;   LOWER EXTREMITY ANGIOGRAPHY Left 08/15/2020   Procedure: LOWER EXTREMITY ANGIOGRAPHY;  Surgeon: Algernon Huxley, MD;  Location: [place] [facility];  Service: Cardiovascular;  Laterality: Left;   LOWER EXTREMITY ANGIOGRAPHY Right 08/22/2020   Procedure: LOWER EXTREMITY ANGIOGRAPHY;  Surgeon: Algernon Huxley, MD;  Location: [place] [facility];  Service: Cardiovascular;  Laterality: Right;     Social History   Tobacco Use   Smoking status: Former    Packs/day: 0.50    Years: 50.00    Pack years: 25.00    Types: Cigarettes   Smokeless tobacco: Never   Tobacco comments:    trying to quit on her own  Vaping Use   Vaping Use: Never used  Substance Use Topics   Alcohol use: Not Currently    Comment: Beer heavy everyday in past - last 10 years ago   Drug use: No      Family History  Problem Relation Age of Onset   Breast cancer Mother 48   Breast cancer Maternal Aunt    Breast cancer Maternal Grandmother    Breast cancer Sister 43     Allergies  Allergen Reactions   Seasonal Ic [Cholestatin]     Seasonal allergies     REVIEW OF SYSTEMS (Negative unless checked)  Constitutional: [] Weight loss  [] Fever  [] Chills Cardiac: [] Chest pain   [] Chest pressure   [] Palpitations   [] Shortness of breath when laying flat   [] Shortness of breath at rest   [] Shortness of breath with exertion. Vascular:  [x] Pain in legs with walking   [] Pain in legs at rest   [] Pain in legs when laying flat   [x] Claudication   [] Pain in feet when walking  [] Pain in feet at rest  [] Pain in feet when laying flat   [] History of DVT   [] Phlebitis   [x] Swelling in legs   [] Varicose veins   [] Non-healing ulcers Pulmonary:   [] Uses home oxygen   [] Productive cough   [] Hemoptysis   [] Wheeze  [] COPD   [] Asthma Neurologic:  [] Dizziness  [] Blackouts   [] Seizures   [] History of stroke   [] History of TIA  [] Aphasia   [] Temporary blindness   [] Dysphagia   [] Weakness or numbness in arms    [] Weakness or numbness in legs Musculoskeletal:  [x] Arthritis   [] Joint swelling   [] Joint pain   [] Low back pain Hematologic:  [] Easy bruising  [] Easy bleeding   [] Hypercoagulable state   [] Anemic   Gastrointestinal:  [] Blood in stool   [] Vomiting blood  [] Gastroesophageal reflux/heartburn   [] Abdominal pain Genitourinary:  [] Chronic kidney disease   [] Difficult urination  [] Frequent urination  [] Burning with urination   [] Hematuria Skin:  [] Rashes   [] Ulcers   [] Wounds Psychological:  [] History of anxiety   []  History of major depression.  Physical Examination  BP  136/78 (BP Location: Right Arm)    Pulse 76    Resp 16    Wt 171 lb (77.6 kg)    BMI 29.35 kg/m  Gen:  WD/WN, NAD Head: [place]/AT, No temporalis wasting. Ear/Nose/Throat: Hearing grossly intact, nares w/o erythema or drainage Eyes: Conjunctiva clear. Sclera non-icteric Neck: Supple.  Trachea midline Pulmonary:  Good air movement, no use of accessory muscles.  Cardiac: RRR, no JVD Vascular:  Vessel Right Left  Radial Palpable Palpable                          PT Palpable Palpable  DP Palpable Palpable   Musculoskeletal: M/S 5/5 throughout.  No deformity or atrophy.  No significant lower extremity edema. Neurologic: Sensation grossly intact in extremities.  Symmetrical.  Speech is fluent.  Psychiatric: Judgment intact, Mood & affect appropriate for pt's clinical situation. Dermatologic: No rashes or ulcers noted.  No cellulitis or open wounds.      Labs No results found for this or any previous visit (from the past 2160 hour(s)).  Radiology No results found.  Assessment/Plan  Benign essential hypertension blood pressure control important in reducing the progression of atherosclerotic disease. On appropriate oral medications.   Atherosclerosis of native arteries of extremity with intermittent claudication (HCC) ABIs today are 1.03 bilaterally with multiphasic waveforms and normal digital pressures consistent  with no arterial insufficiency after multiple previous surgeries and interventions.  Doing well.  Continue current medical regimen.  She has stopped smoking and is congratulated on this.  Recheck in 6 months.    [name], MD  01/03/2021 10:43 AM    This note was created with Dragon medical transcription system.  Any errors from dictation are purely unintentional

## 2021-01-26 DIAGNOSIS — Z853 Personal history of malignant neoplasm of breast: Secondary | ICD-10-CM | POA: Diagnosis not present

## 2021-01-26 DIAGNOSIS — I10 Essential (primary) hypertension: Secondary | ICD-10-CM | POA: Diagnosis not present

## 2021-01-26 DIAGNOSIS — I739 Peripheral vascular disease, unspecified: Secondary | ICD-10-CM | POA: Diagnosis not present

## 2021-01-26 DIAGNOSIS — F3340 Major depressive disorder, recurrent, in remission, unspecified: Secondary | ICD-10-CM | POA: Diagnosis not present

## 2021-01-26 DIAGNOSIS — Z0000 Encounter for general adult medical examination without abnormal findings: Secondary | ICD-10-CM | POA: Diagnosis not present

## 2021-03-16 IMAGING — MG DIGITAL DIAGNOSTIC BILAT W/ TOMO W/ CAD
6 of 10 series · 6 of 30 positions shown · non-contrast
Comparison: Previous exam(s).

CLINICAL DATA: Delayed follow-up of a probable benign mass in the
left breast. The mass was originally seen on screening mammogram
dated 04/18/2017. History of right breast cancer 4114.

EXAM:
DIGITAL DIAGNOSTIC BILATERAL MAMMOGRAM WITH CAD AND TOMO
ULTRASOUND LEFT BREAST

[R MLO synth-2D]
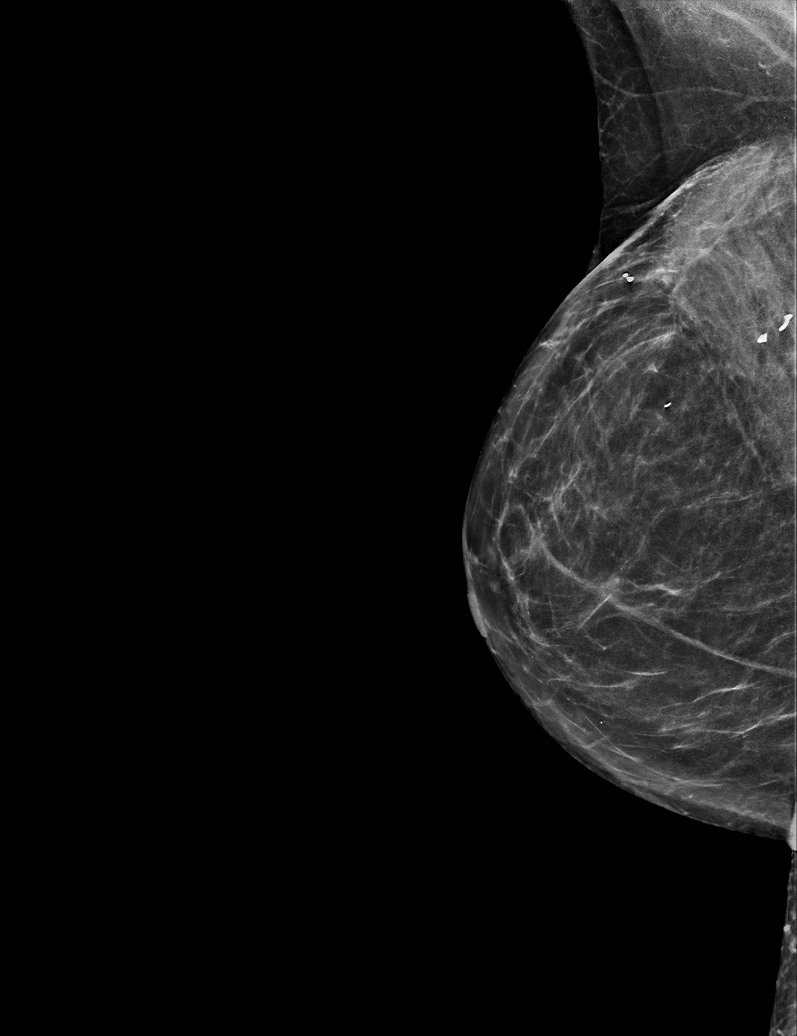

[R XCCL synth-2D]
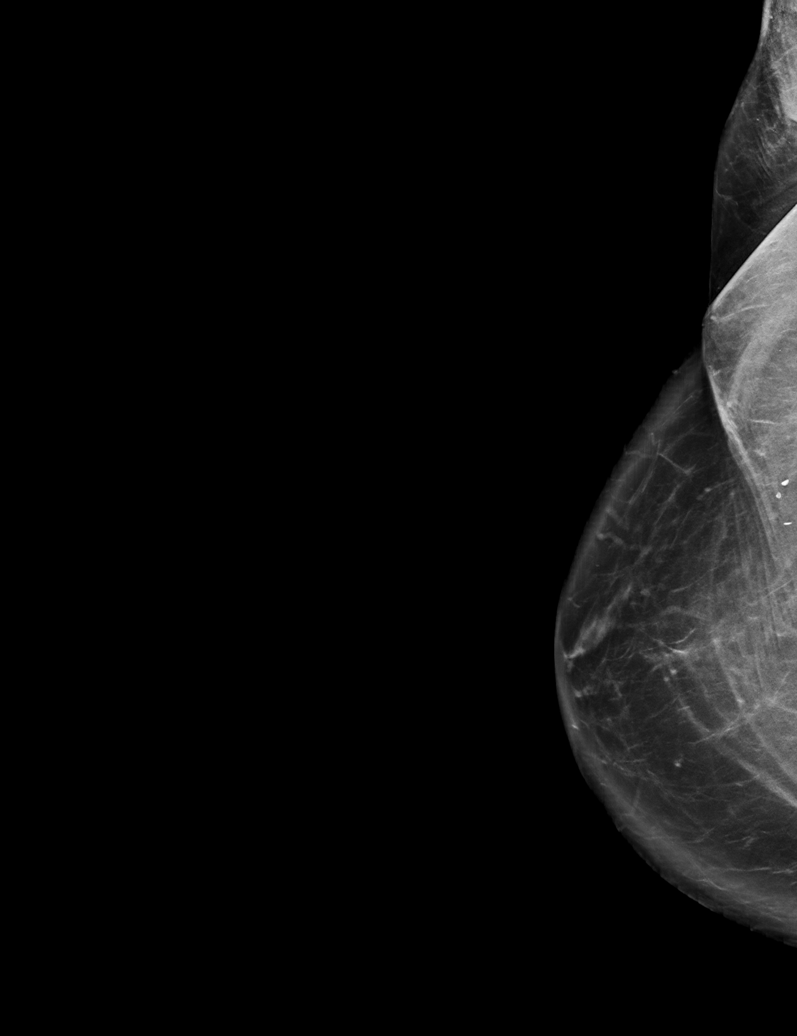

[L MLO synth-2D]
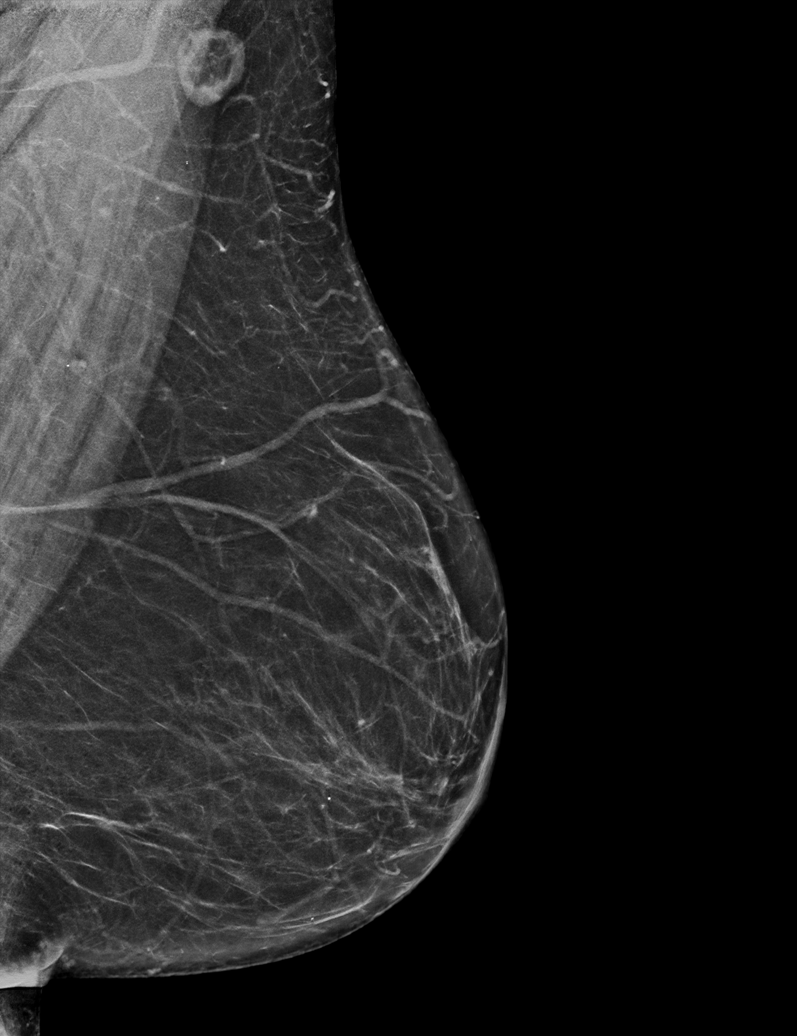

[L CC synth-2D]
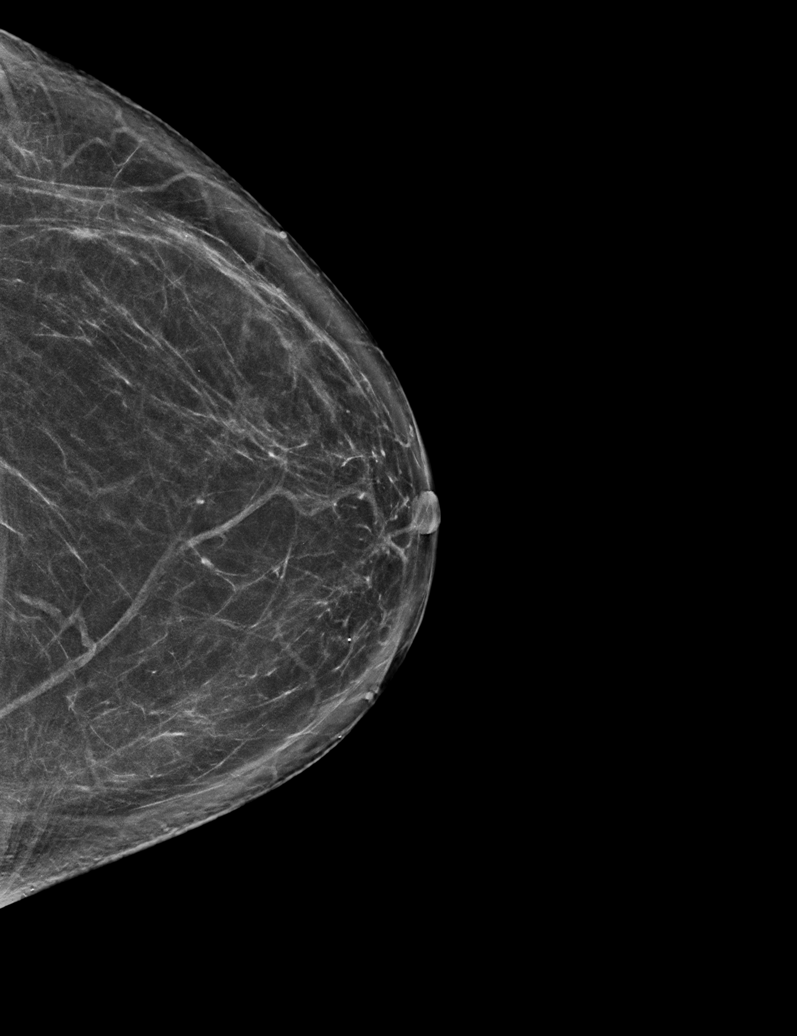

[R CC synth-2D]
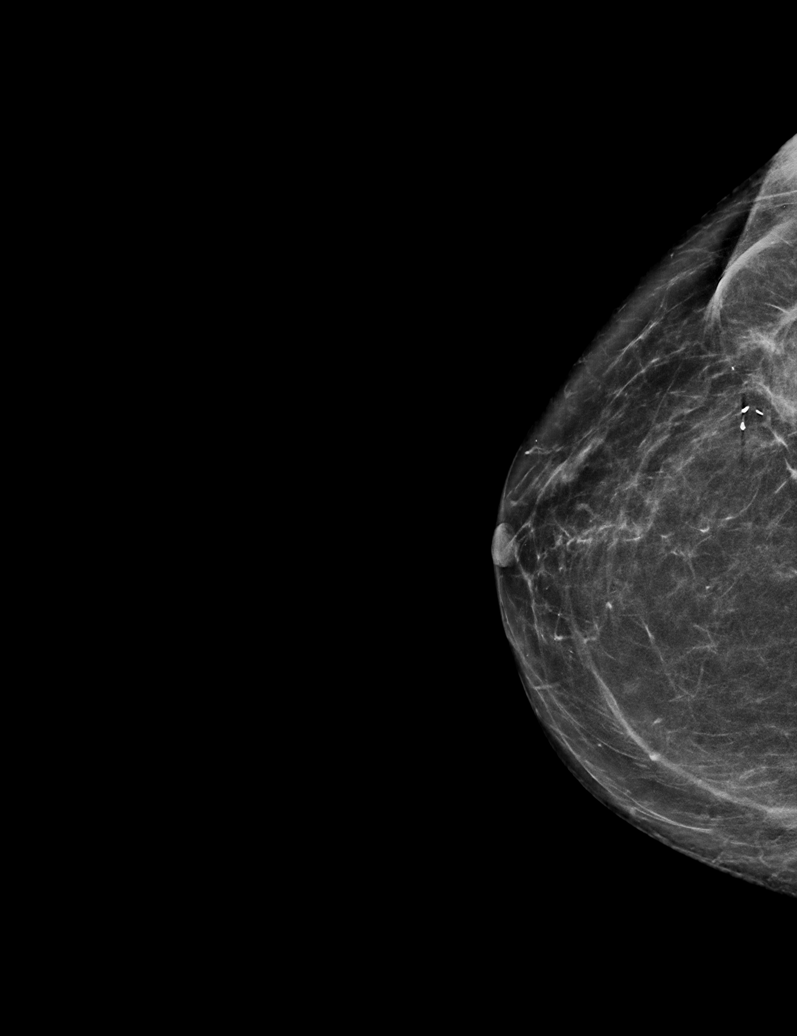

[L MLO tomo · tomo slice 33/65.0]
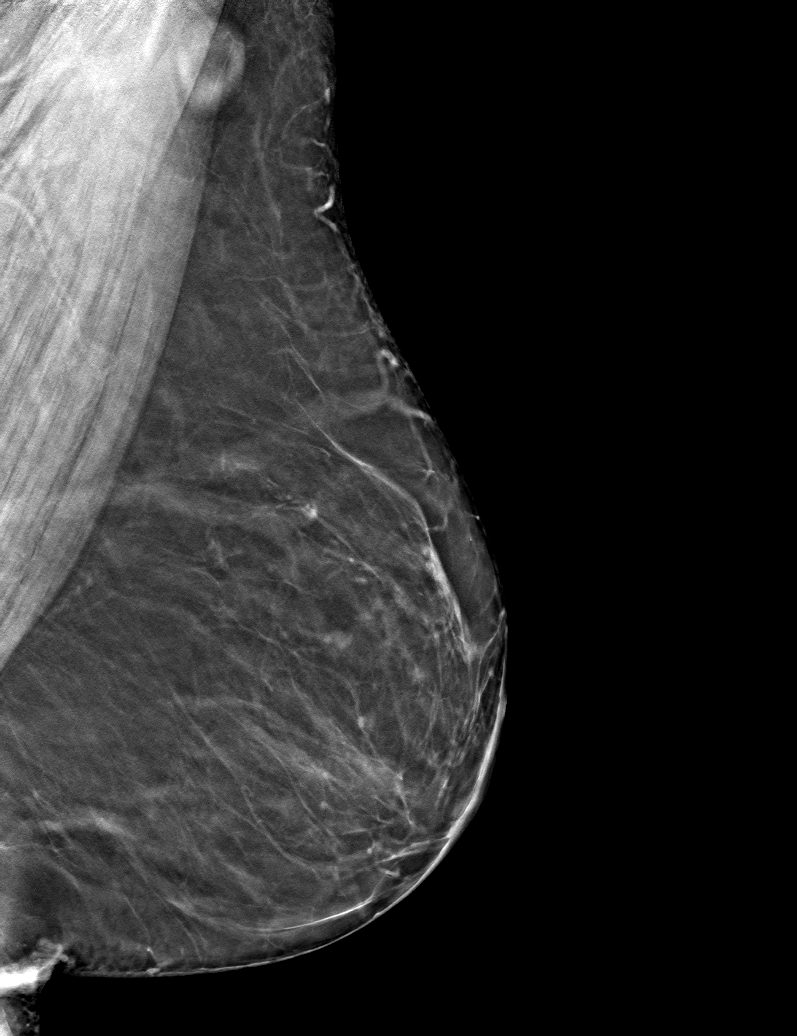

[6 of 30 positions shown; findings below may reference images not displayed]

ACR Breast Density Category b: There are scattered areas of
fibroglandular density.
FINDINGS: The mass in the lateral aspect of the left breast is unchanged
compared to prior exams dated 04/19/2017 and 10/31/2017. Stable
lumpectomy changes are seen in the right breast. No suspicious mass
or malignant type microcalcifications identified in either breast.

Mammographic images were processed with CAD.

Targeted ultrasound is performed, showing a stable hypoechoic mass
in left breast at 3 o'clock 7 cm from the nipple measuring 4 x 2 x 4
mm. On the prior ultrasound dated 04/30/2017 it measured 5 x 2 x 4
mm.
IMPRESSION: Stable benign-appearing mass in the left breast. No evidence of
malignancy in either breast.

RECOMMENDATION:
Bilateral screening mammogram in 1 year is recommended.

I have discussed the findings and recommendations with the patient.
If applicable, a reminder letter will be sent to the patient
regarding the next appointment.

BI-RADS CATEGORY  Choose

## 2021-03-16 IMAGING — US US BREAST*L* LIMITED INC AXILLA
1 series · 7 of 7 positions shown · non-contrast
Comparison: Previous exam(s).

CLINICAL DATA: Delayed follow-up of a probable benign mass in the
left breast. The mass was originally seen on screening mammogram
dated 04/18/2017. History of right breast cancer 4114.

EXAM:
DIGITAL DIAGNOSTIC BILATERAL MAMMOGRAM WITH CAD AND TOMO
ULTRASOUND LEFT BREAST

[Series 1: us breast*left* limited inc axilla · 0.04mm/px · 7 of 7 slices shown]
[im 1/7]
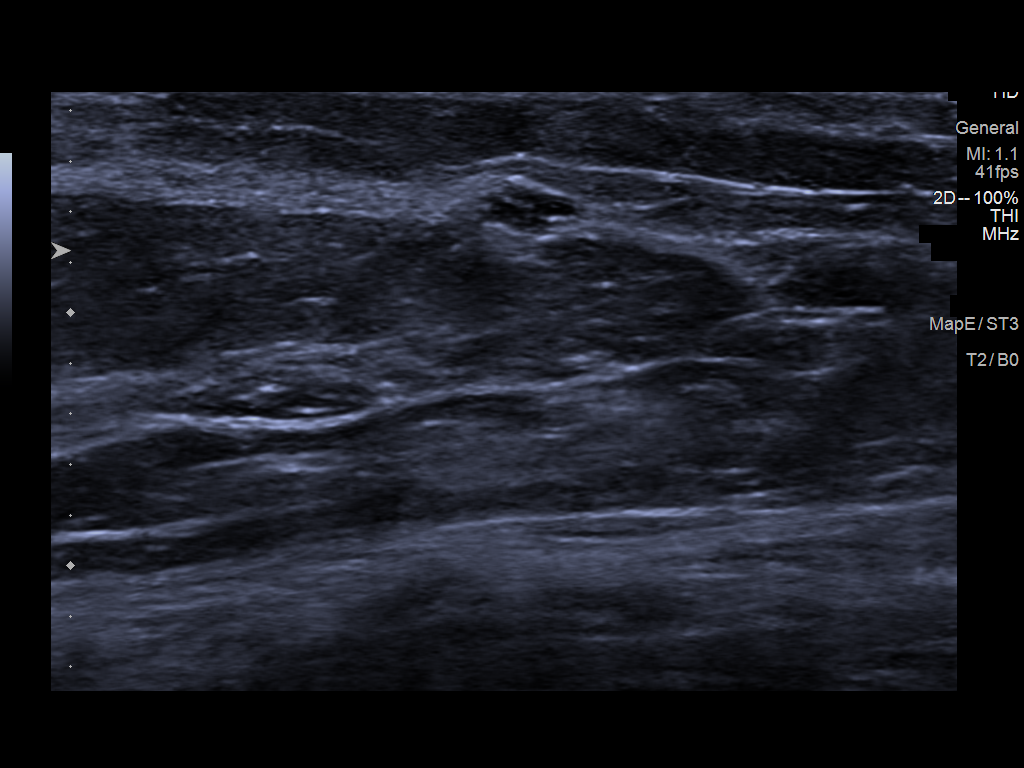
[im 2/7]
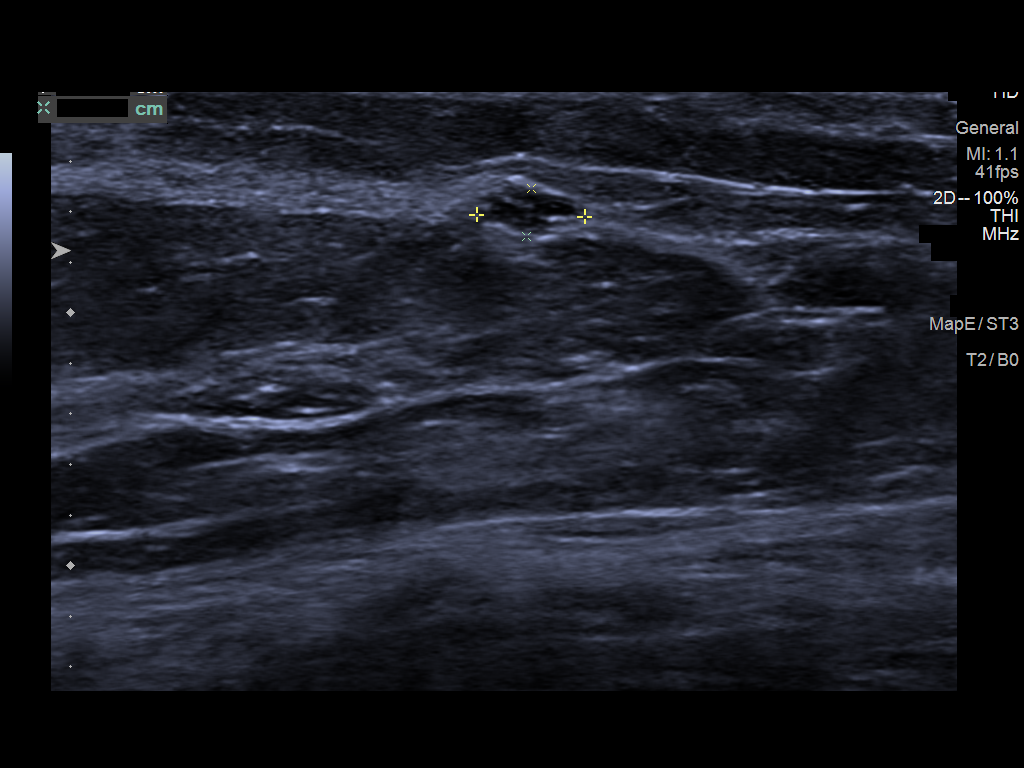
[im 3/7]
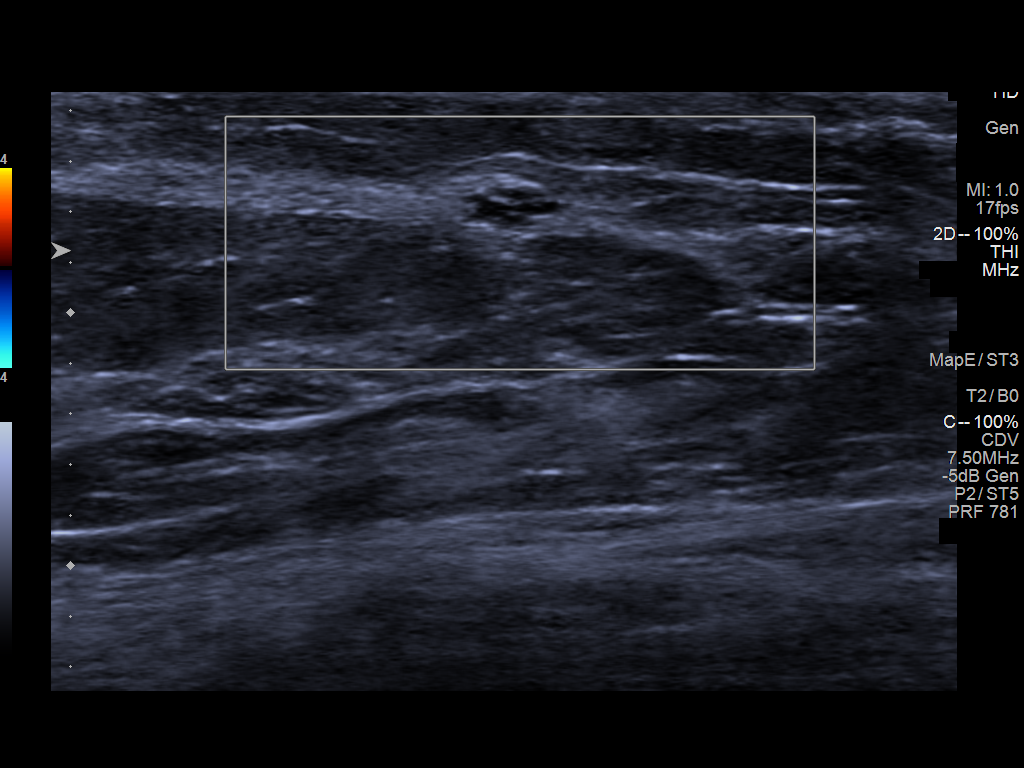
[im 4/7]
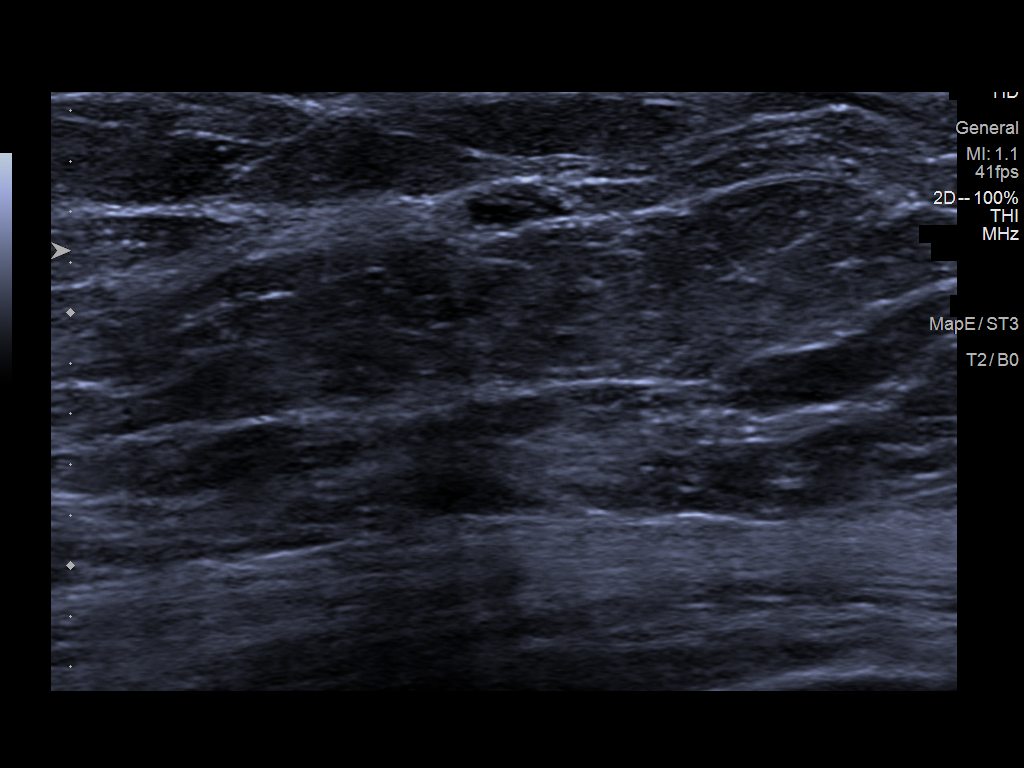
[im 5/7]
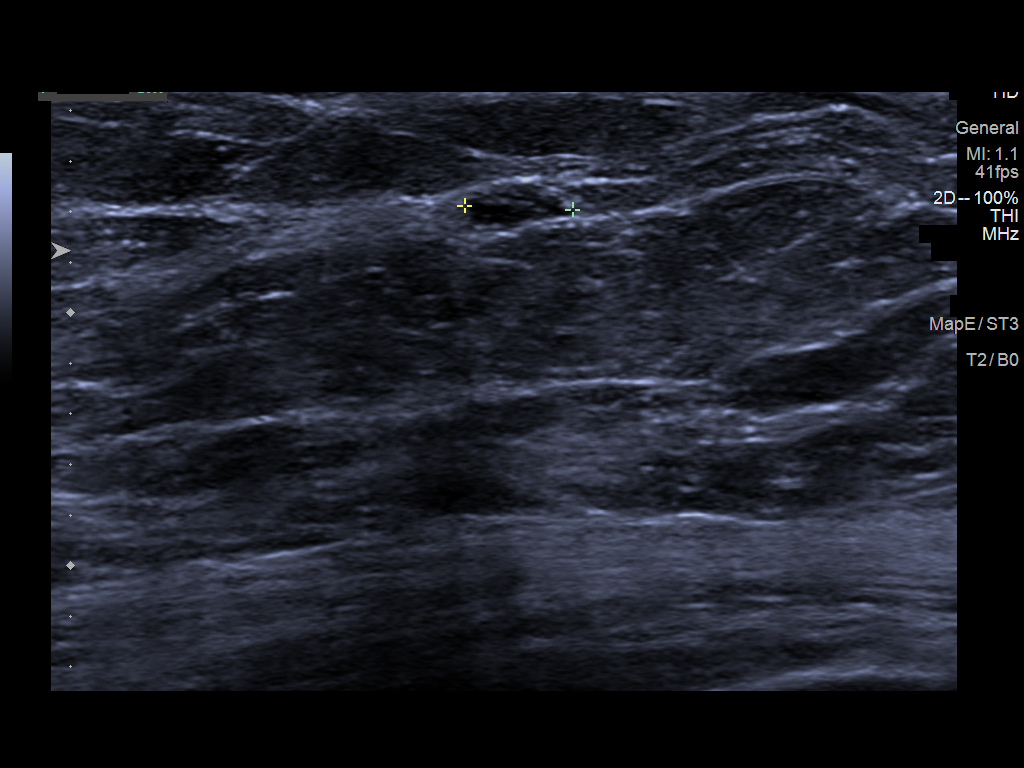
[im 6/7]
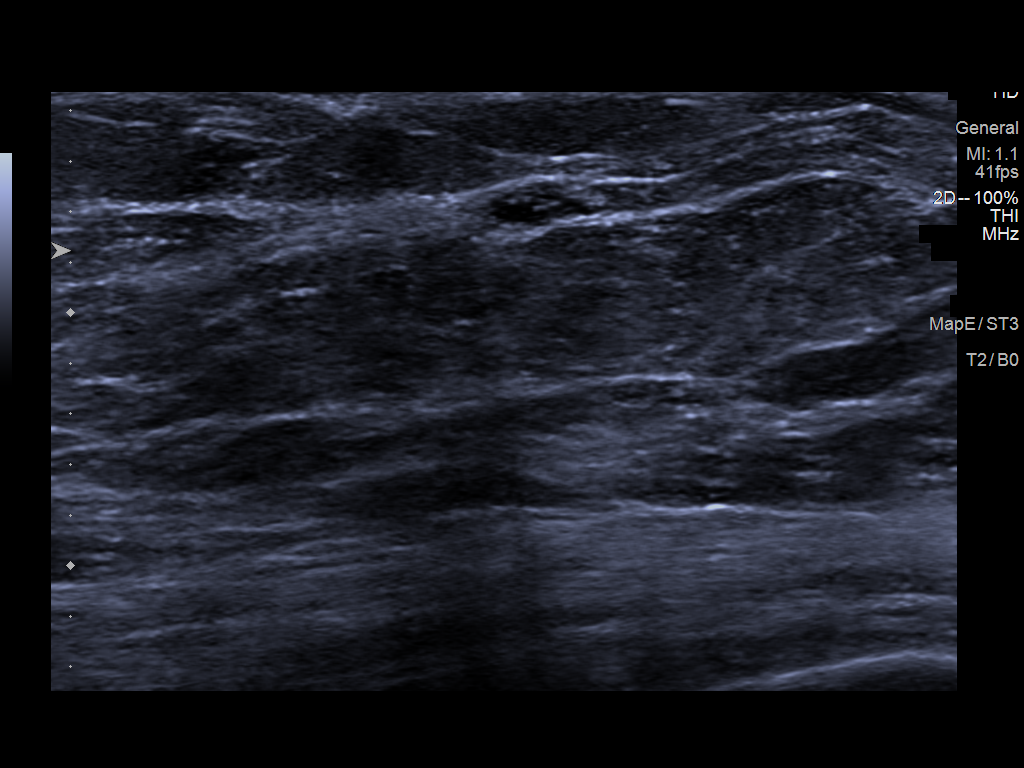
[im 7/7]
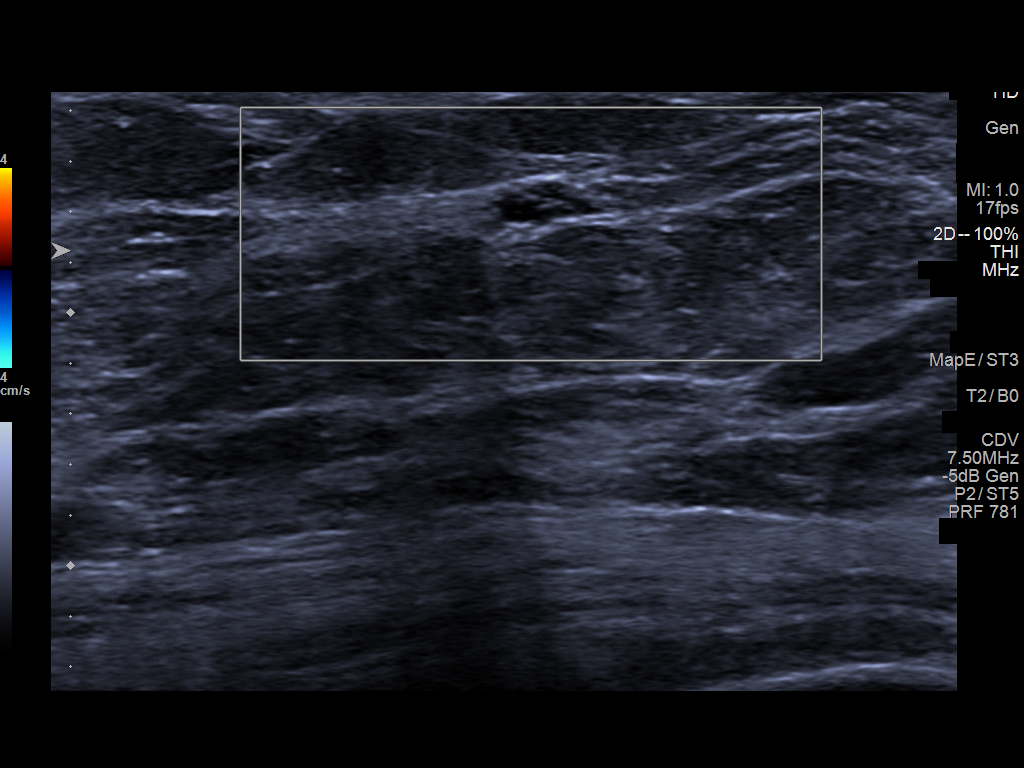

[7 of 7 positions shown; findings below may reference images not displayed]

ACR Breast Density Category b: There are scattered areas of
fibroglandular density.
FINDINGS: The mass in the lateral aspect of the left breast is unchanged
compared to prior exams dated 04/19/2017 and 10/31/2017. Stable
lumpectomy changes are seen in the right breast. No suspicious mass
or malignant type microcalcifications identified in either breast.

Mammographic images were processed with CAD.

Targeted ultrasound is performed, showing a stable hypoechoic mass
in left breast at 3 o'clock 7 cm from the nipple measuring 4 x 2 x 4
mm. On the prior ultrasound dated 04/30/2017 it measured 5 x 2 x 4
mm.
IMPRESSION: Stable benign-appearing mass in the left breast. No evidence of
malignancy in either breast.

RECOMMENDATION:
Bilateral screening mammogram in 1 year is recommended.

I have discussed the findings and recommendations with the patient.
If applicable, a reminder letter will be sent to the patient
regarding the next appointment.

BI-RADS CATEGORY  Choose

## 2021-03-24 ENCOUNTER — Other Ambulatory Visit: Payer: Self-pay | Admitting: Internal Medicine

## 2021-03-24 DIAGNOSIS — I739 Peripheral vascular disease, unspecified: Secondary | ICD-10-CM | POA: Diagnosis not present

## 2021-03-24 DIAGNOSIS — F419 Anxiety disorder, unspecified: Secondary | ICD-10-CM | POA: Diagnosis not present

## 2021-03-24 DIAGNOSIS — R3129 Other microscopic hematuria: Secondary | ICD-10-CM | POA: Diagnosis not present

## 2021-03-24 DIAGNOSIS — I10 Essential (primary) hypertension: Secondary | ICD-10-CM | POA: Diagnosis not present

## 2021-03-24 DIAGNOSIS — K219 Gastro-esophageal reflux disease without esophagitis: Secondary | ICD-10-CM | POA: Diagnosis not present

## 2021-03-24 DIAGNOSIS — Z853 Personal history of malignant neoplasm of breast: Secondary | ICD-10-CM | POA: Diagnosis not present

## 2021-03-24 DIAGNOSIS — Z1231 Encounter for screening mammogram for malignant neoplasm of breast: Secondary | ICD-10-CM

## 2021-03-24 DIAGNOSIS — F32A Depression, unspecified: Secondary | ICD-10-CM | POA: Diagnosis not present

## 2021-03-24 DIAGNOSIS — J309 Allergic rhinitis, unspecified: Secondary | ICD-10-CM | POA: Diagnosis not present

## 2021-04-10 ENCOUNTER — Ambulatory Visit: Payer: Medicare HMO | Admitting: Urology

## 2021-04-10 ENCOUNTER — Encounter: Payer: Self-pay | Admitting: Urology

## 2021-04-10 ENCOUNTER — Other Ambulatory Visit: Payer: Self-pay

## 2021-04-10 VITALS — BP 166/91 | HR 74 | Ht 64.0 in | Wt 173.0 lb

## 2021-04-10 DIAGNOSIS — R3129 Other microscopic hematuria: Secondary | ICD-10-CM | POA: Diagnosis not present

## 2021-04-10 DIAGNOSIS — R310 Gross hematuria: Secondary | ICD-10-CM | POA: Diagnosis not present

## 2021-04-10 NOTE — Progress Notes (Signed)
? ?04/10/2021 ?9:42 AM  ? ?Joan [name] ?1950-12-25 ?[id number] ? ?Referring provider: Tracie Harrier, MD ?[place] ?[facility] ?[place],  [place] [address] ? ?Chief Complaint  ?Patient presents with  ? Hematuria  ? ? ?HPI: ?Joan [name] is a 71 y.o. female referred for microscopic hematuria. ? ?UA 12/2019 with 4-10 RBC ?Recent UA 01/2021 with 10-50 RBC ?Denies gross hematuria ?No bothersome LUTS ?Denies flank, abdominal or pelvic pain ?On Plavix for peripheral vascular disease ?Prior tobacco history started around age 22 and quit 1 year ago.  Average 1 pack/day ? ?PMH: ?Past Medical History:  ?Diagnosis Date  ? Angina at rest [facility])   ? Anxiety   ? Arthritis   ? Breast cancer, right breast [facility])   ? s/p lumpectomy and adjuvant XRT  ? Bronchitis   ? Cancer [facility])   ? Claudication, intermittent ([place])   ? Depression   ? Dyspnea   ? Environmental and seasonal allergies   ? GERD (gastroesophageal reflux disease)   ? with spicy food  ? Hepatitis C antibody test positive   ? Hypertension   ? Peripheral vascular disease ([place])   ? Personal history of radiation therapy 2002  ? RIGHT lumpectomy  ? TIA (transient ischemic attack)   ? no residual. Many years ago  ? ? ?Surgical History: ?Past Surgical History:  ?Procedure Laterality Date  ? APPENDECTOMY    ? BREAST EXCISIONAL BIOPSY Right   ? 11/1999  ? BREAST LUMPECTOMY Right 2002  ? RIGHT lumpectomy w/ radiation 2002  ? BREAST SURGERY    ? CHOLECYSTECTOMY    ? ENDARTERECTOMY FEMORAL Bilateral 06/22/2020  ? Procedure: ENDARTERECTOMY FEMORAL;  Surgeon: Algernon Huxley, MD;  Location: [facility] ORS;  Service: Vascular;  Laterality: Bilateral;  ? INSERTION OF ILIAC STENT Bilateral 06/22/2020  ? Procedure: INSERTION OF ILIAC STENT;  Surgeon: Algernon Huxley, MD;  Location: [facility] ORS;  Service: Vascular;  Laterality: Bilateral;  ? LOWER EXTREMITY ANGIOGRAPHY Left 11/14/2017  ? Procedure: LOWER EXTREMITY ANGIOGRAPHY;  Surgeon: Algernon Huxley, MD;  Location: [place] [facility];   Service: Cardiovascular;  Laterality: Left;  ? LOWER EXTREMITY ANGIOGRAPHY Right 04/21/2020  ? Procedure: LOWER EXTREMITY ANGIOGRAPHY;  Surgeon: Algernon Huxley, MD;  Location: [place] [facility];  Service: Cardiovascular;  Laterality: Right;  ? LOWER EXTREMITY ANGIOGRAPHY Left 08/15/2020  ? Procedure: LOWER EXTREMITY ANGIOGRAPHY;  Surgeon: Algernon Huxley, MD;  Location: [place] [facility];  Service: Cardiovascular;  Laterality: Left;  ? LOWER EXTREMITY ANGIOGRAPHY Right 08/22/2020  ? Procedure: LOWER EXTREMITY ANGIOGRAPHY;  Surgeon: Algernon Huxley, MD;  Location: [place] [facility];  Service: Cardiovascular;  Laterality: Right;  ? ? ?Home Medications:  ?Allergies as of 04/10/2021   ? ?   Reactions  ? Seasonal Ic [cholestatin]   ? Seasonal allergies  ? ?  ? ?  ?Medication List  ?  ? ?  ? Accurate as of April 10, 2021  9:42 AM. If you have any questions, ask your nurse or doctor.  ?  ?  ? ?  ? ?STOP taking these medications   ? ?alum & mag hydroxide-simeth 200-200-20 MG/5ML suspension ?Commonly known as: MAALOX/MYLANTA ?Stopped by: Abbie Sons, MD ?  ?atorvastatin 10 MG tablet ?Commonly known as: Lipitor ?Stopped by: Abbie Sons, MD ?  ?docusate sodium 100 MG capsule ?Commonly known as: COLACE ?Stopped by: Abbie Sons, MD ?  ?lisinopril 20 MG tablet ?Commonly known as: ZESTRIL ?Stopped by: Nicki Reaper  Mariana Arn, MD ?  ?oxyCODONE-acetaminophen 5-325 MG tablet ?Commonly known as: PERCOCET/ROXICET ?Stopped by: Abbie Sons, MD ?  ?rosuvastatin 5 MG tablet ?Commonly known as: CRESTOR ?Stopped by: Abbie Sons, MD ?  ?silver sulfADIAZINE 1 % cream ?Commonly known as: SILVADENE ?Stopped by: Abbie Sons, MD ?  ? ?  ? ?TAKE these medications   ? ?acetaminophen 325 MG tablet ?Commonly known as: TYLENOL ?Take 1-2 tablets (325-650 mg total) by mouth every 4 (four) hours as needed for mild pain (or temp >/= 101 F). ?  ?ALPRAZolam 0.5 MG tablet ?Commonly known as: Duanne Moron ?Take 0.5 mg by mouth 2 (two) times daily as  needed for anxiety. ?  ?aspirin EC 81 MG tablet ?Take 81 mg by mouth daily. ?  ?clopidogrel 75 MG tablet ?Commonly known as: Plavix ?Take 1 tablet (75 mg total) by mouth daily. ?  ?loratadine 10 MG tablet ?Commonly known as: CLARITIN ?Take 10 mg by mouth daily as needed for allergies. ?  ?losartan 50 MG tablet ?Commonly known as: COZAAR ?Take by mouth. ?  ?pantoprazole 40 MG tablet ?Commonly known as: PROTONIX ?Take by mouth. ?  ?sertraline 100 MG tablet ?Commonly known as: ZOLOFT ?Take 100 mg by mouth daily. ?  ? ?  ? ? ?Allergies:  ?Allergies  ?Allergen Reactions  ? Seasonal Ic [Cholestatin]   ?  Seasonal allergies  ? ? ?Family History: ?Family History  ?Problem Relation Age of Onset  ? Breast cancer Mother 21  ? Breast cancer Maternal Aunt   ? Breast cancer Maternal Grandmother   ? Breast cancer Sister 64  ? ? ?Social History:  reports that she has quit smoking. Her smoking use included cigarettes. She has a 25.00 pack-year smoking history. She has never used smokeless tobacco. She reports that she does not currently use alcohol. She reports that she does not use drugs. ? ? ?Physical Exam: ?BP (!) 166/91   Pulse 74   Ht '5\' 4"'$  (1.626 m)   Wt 173 lb (78.5 kg)   BMI 29.70 kg/m?   ?Constitutional:  Alert and oriented, No acute distress. ?HEENT: [place] AT, moist mucus membranes.  Trachea midline, no masses. ?Cardiovascular: No clubbing, cyanosis, or edema. ?Respiratory: Normal respiratory effort, no increased work of breathing. ?Psychiatric: Normal mood and affect. ? ?Laboratory Data: ? ?Urinalysis ?Dipstick/microscopy negative ? ? ?Assessment & Plan:   ? ?1.  Microhematuria ?AUA hematuria risk stratification: High ?We discussed the recommended evaluation for high risk hematuria which includes CT urogram and cystoscopy ?Each procedure was discussed in detail ?She desires to proceed with further evaluation.  CT order placed and cystoscopy scheduled ? ? ?Abbie Sons, MD ? ?[place] ?[address], [address] ?[place], [place] [address] ?[phone] ?

## 2021-04-11 LAB — URINALYSIS, COMPLETE
Bilirubin, UA: NEGATIVE
Glucose, UA: NEGATIVE
Ketones, UA: NEGATIVE
Leukocytes,UA: NEGATIVE
Nitrite, UA: NEGATIVE
Protein,UA: NEGATIVE
RBC, UA: NEGATIVE
Specific Gravity, UA: 1.015 (ref 1.005–1.030)
Urobilinogen, Ur: 0.2 mg/dL (ref 0.2–1.0)
pH, UA: 5.5 (ref 5.0–7.5)

## 2021-04-11 LAB — MICROSCOPIC EXAMINATION: Bacteria, UA: NONE SEEN

## 2021-04-13 DIAGNOSIS — E119 Type 2 diabetes mellitus without complications: Secondary | ICD-10-CM | POA: Diagnosis not present

## 2021-04-13 DIAGNOSIS — H35361 Drusen (degenerative) of macula, right eye: Secondary | ICD-10-CM | POA: Diagnosis not present

## 2021-04-13 DIAGNOSIS — H35362 Drusen (degenerative) of macula, left eye: Secondary | ICD-10-CM | POA: Diagnosis not present

## 2021-05-03 ENCOUNTER — Ambulatory Visit
Admission: RE | Admit: 2021-05-03 | Discharge: 2021-05-03 | Disposition: A | Discharge status: Home / Self Care | Payer: Medicare HMO | Source: Ambulatory Visit | Attending: Internal Medicine | Admitting: Internal Medicine

## 2021-05-03 DIAGNOSIS — Z1231 Encounter for screening mammogram for malignant neoplasm of breast: Secondary | ICD-10-CM | POA: Insufficient documentation

## 2021-05-15 ENCOUNTER — Ambulatory Visit: Admission: RE | Admit: 2021-05-15 | Payer: Medicare HMO | Source: Ambulatory Visit

## 2021-05-17 ENCOUNTER — Ambulatory Visit
Admission: RE | Admit: 2021-05-17 | Discharge: 2021-05-17 | Disposition: A | Discharge status: Home / Self Care | Payer: Medicare HMO | Source: Ambulatory Visit | Attending: Urology | Admitting: Urology

## 2021-05-17 DIAGNOSIS — N281 Cyst of kidney, acquired: Secondary | ICD-10-CM | POA: Diagnosis not present

## 2021-05-17 DIAGNOSIS — R3129 Other microscopic hematuria: Secondary | ICD-10-CM | POA: Diagnosis not present

## 2021-05-17 DIAGNOSIS — K5730 Diverticulosis of large intestine without perforation or abscess without bleeding: Secondary | ICD-10-CM | POA: Diagnosis not present

## 2021-05-17 DIAGNOSIS — N200 Calculus of kidney: Secondary | ICD-10-CM | POA: Diagnosis not present

## 2021-05-17 LAB — POCT I-STAT CREATININE: Creatinine, Ser: 0.7 mg/dL (ref 0.44–1.00)

## 2021-05-17 MED ORDER — IOHEXOL 300 MG/ML  SOLN
125.0000 mL | Freq: Once | INTRAMUSCULAR | Status: AC | PRN
  Administered 2021-05-17: 125 mL via INTRAVENOUS

## 2021-05-25 ENCOUNTER — Encounter: Payer: Self-pay | Admitting: Urology

## 2021-05-25 ENCOUNTER — Ambulatory Visit: Payer: Medicare HMO | Admitting: Urology

## 2021-05-25 VITALS — BP 176/82 | HR 81 | Ht 67.0 in | Wt 182.0 lb

## 2021-05-25 DIAGNOSIS — E278 Other specified disorders of adrenal gland: Secondary | ICD-10-CM

## 2021-05-25 DIAGNOSIS — R3129 Other microscopic hematuria: Secondary | ICD-10-CM

## 2021-05-25 LAB — MICROSCOPIC EXAMINATION
Bacteria, UA: NONE SEEN
WBC, UA: NONE SEEN /hpf (ref 0–5)

## 2021-05-25 LAB — URINALYSIS, COMPLETE
Bilirubin, UA: NEGATIVE
Glucose, UA: NEGATIVE
Ketones, UA: NEGATIVE
Leukocytes,UA: NEGATIVE
Nitrite, UA: NEGATIVE
Protein,UA: NEGATIVE
Specific Gravity, UA: 1.015 (ref 1.005–1.030)
Urobilinogen, Ur: 0.2 mg/dL (ref 0.2–1.0)
pH, UA: 5.5 (ref 5.0–7.5)

## 2021-05-25 NOTE — Progress Notes (Signed)
? ?  05/25/21 ? ?CC:  ?Chief Complaint  ?Patient presents with  ? Cysto  ? ?Indications: Microhematuria; AUA high risk ? ?HPI: No complaints today.  UA clear. CTU with small, bilateral nonobstructing renal calculi-largest 3 mm.  Right lower pole simple cyst.  Small bilateral adrenal nodules measuring 10/12 mm ? ?Blood pressure (!) 176/82, pulse 81, height '5\' 7"'$  (1.702 m), weight 182 lb (82.6 kg). ?NED. A&Ox3.   ?No respiratory distress   ?Abd soft, NT, ND ?Normal external genitalia with patent urethral meatus ? ?Cystoscopy Procedure Note ? ?Patient identification was confirmed, informed consent was obtained, and patient was prepped using Betadine solution.  Lidocaine jelly was administered per urethral meatus.   ? ?Procedure: ?- Flexible cystoscope introduced, without any difficulty.   ?- Thorough search of the bladder revealed: ?   normal urethral meatus ?   normal urothelium ?   no stones ?   no ulcers  ?   no tumors ?   no urethral polyps ?   no trabeculation ? ?- Ureteral orifices were normal in position and appearance. ? ?Post-Procedure: ?- Patient tolerated the procedure well ? ?Assessment/ Plan: ?No bladder mucosal abnormalities ?Small, nonobstructing bilateral renal calculi ?Small adrenal masses-radiology recommended adrenal mass protocol CT or MRI  ? ? ? ?Abbie Sons, MD ? ?

## 2021-05-28 ENCOUNTER — Encounter: Payer: Self-pay | Admitting: Urology

## 2021-05-31 ENCOUNTER — Telehealth: Payer: Self-pay | Admitting: Urology

## 2021-05-31 NOTE — Telephone Encounter (Signed)
1 year f/u appt ?APPT MADE AND MAILED ?

## 2021-07-04 ENCOUNTER — Other Ambulatory Visit (INDEPENDENT_AMBULATORY_CARE_PROVIDER_SITE_OTHER): Payer: Self-pay | Admitting: Vascular Surgery

## 2021-07-04 ENCOUNTER — Ambulatory Visit (INDEPENDENT_AMBULATORY_CARE_PROVIDER_SITE_OTHER): Payer: Medicare HMO

## 2021-07-04 ENCOUNTER — Ambulatory Visit (INDEPENDENT_AMBULATORY_CARE_PROVIDER_SITE_OTHER): Payer: Medicare HMO | Admitting: Vascular Surgery

## 2021-07-04 ENCOUNTER — Encounter (INDEPENDENT_AMBULATORY_CARE_PROVIDER_SITE_OTHER): Payer: Self-pay | Admitting: Vascular Surgery

## 2021-07-04 VITALS — BP 144/79 | HR 73 | Resp 17 | Ht 64.0 in | Wt 186.0 lb

## 2021-07-04 DIAGNOSIS — I10 Essential (primary) hypertension: Secondary | ICD-10-CM | POA: Diagnosis not present

## 2021-07-04 DIAGNOSIS — I739 Peripheral vascular disease, unspecified: Secondary | ICD-10-CM

## 2021-07-04 DIAGNOSIS — I70213 Atherosclerosis of native arteries of extremities with intermittent claudication, bilateral legs: Secondary | ICD-10-CM | POA: Diagnosis not present

## 2021-07-04 NOTE — Assessment & Plan Note (Signed)
ABIs today are 0.93 on the right and have dropped a bit to 0.86 on the left.  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 We discussed the situation in detail today.  The patient has had some recurrent stenosis below previous interventions.  Her symptoms are currently fairly mild.  At this point, I have offered her intervention to address the left popliteal stenosis versus a shortened interval follow-up with continued medical regimen.  She would prefer the latter and with mild symptoms we will plan on a 47-monthfollow-up interval.  If she has worsening symptoms she will contact our office.  She is again congratulated on smoking cessation.

## 2021-07-04 NOTE — Progress Notes (Signed)
MRN : [id number]  Joan [name] is a 71 y.o. (Mar 15, 1950) female who presents with chief complaint of  Chief Complaint  Patient presents with   Follow-up    ultrasound  .  History of Present Illness: Patient returns today in follow up of her PAD.  She has undergone extensive bilateral lower extremity revascularizations and is generally done well.  She has noticed some claudication symptoms on the left leg that have worsened since her last visit, they remain fairly mild.  No current right leg symptoms.  No rest pain or ulceration. ABIs today are 0.93 on the right and have dropped a bit to 0.86 on the left.  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  Current Outpatient Medications  Medication Sig Dispense Refill   acetaminophen (TYLENOL) 325 MG tablet Take 1-2 tablets (325-650 mg total) by mouth every 4 (four) hours as needed for mild pain (or temp >/= 101 F). 25 tablet 1   ALPRAZolam (XANAX) 0.5 MG tablet Take 0.5 mg by mouth 2 (two) times daily as needed for anxiety.  0   aspirin EC 81 MG tablet Take 81 mg by mouth daily.     clopidogrel (PLAVIX) 75 MG tablet Take 1 tablet (75 mg total) by mouth daily. 30 tablet 6   loratadine (CLARITIN) 10 MG tablet Take 10 mg by mouth daily as needed for allergies.     losartan (COZAAR) 50 MG tablet Take by mouth.     pantoprazole (PROTONIX) 40 MG tablet Take by mouth.     sertraline (ZOLOFT) 100 MG tablet Take 100 mg by mouth daily.     No current facility-administered medications for this visit.    Past Medical History:  Diagnosis Date   Angina at rest [facility])    Anxiety    Arthritis    Breast cancer, right breast ([place])    s/p lumpectomy and adjuvant XRT   Bronchitis    Cancer ([place])    Claudication, intermittent ([place])    Depression    Dyspnea    Environmental and seasonal allergies    GERD (gastroesophageal reflux disease)     with spicy food   Hepatitis C antibody test positive    Hypertension    Peripheral vascular disease ([place])    Personal history of radiation therapy 2002   RIGHT lumpectomy   TIA (transient ischemic attack)    no residual. Many years ago    Past Surgical History:  Procedure Laterality Date   APPENDECTOMY     BREAST EXCISIONAL BIOPSY Right    11/1999   BREAST LUMPECTOMY Right 2002   RIGHT lumpectomy w/ radiation 2002   BREAST SURGERY     CHOLECYSTECTOMY     ENDARTERECTOMY FEMORAL Bilateral 06/22/2020   Procedure: ENDARTERECTOMY FEMORAL;  Surgeon: Algernon Huxley, MD;  Location: [facility] ORS;  Service: Vascular;  Laterality: Bilateral;   INSERTION OF ILIAC STENT Bilateral 06/22/2020   Procedure: INSERTION OF ILIAC STENT;  Surgeon: Algernon Huxley, MD;  Location: [facility] ORS;  Service: Vascular;  Laterality: Bilateral;   LOWER EXTREMITY ANGIOGRAPHY Left 11/14/2017   Procedure: LOWER EXTREMITY ANGIOGRAPHY;  Surgeon: Algernon Huxley, MD;  Location: [place] [facility];  Service: Cardiovascular;  Laterality: Left;   LOWER EXTREMITY ANGIOGRAPHY Right 04/21/2020   Procedure: LOWER EXTREMITY ANGIOGRAPHY;  Surgeon: Algernon Huxley, MD;  Location: [place] CV LAB;  Service: Cardiovascular;  Laterality: Right;   LOWER EXTREMITY ANGIOGRAPHY Left 08/15/2020   Procedure: LOWER EXTREMITY ANGIOGRAPHY;  Surgeon: Algernon Huxley, MD;  Location: [place] [facility];  Service: Cardiovascular;  Laterality: Left;   LOWER EXTREMITY ANGIOGRAPHY Right 08/22/2020   Procedure: LOWER EXTREMITY ANGIOGRAPHY;  Surgeon: Algernon Huxley, MD;  Location: [place] CV LAB;  Service: Cardiovascular;  Laterality: Right;     Social History   Tobacco Use   Smoking status: Former    Packs/day: 0.50    Years: 50.00    Total pack years: 25.00    Types: Cigarettes   Smokeless tobacco: Never   Tobacco comments:    trying to quit on her own  Vaping Use   Vaping Use: Never used  Substance Use Topics   Alcohol use: Not Currently     Comment: Beer heavy everyday in past - last 10 years ago   Drug use: No      Family History  Problem Relation Age of Onset   Breast cancer Mother 49   Breast cancer Maternal Aunt    Breast cancer Maternal Grandmother    Breast cancer Sister 66     Allergies  Allergen Reactions   Seasonal Ic [Cholestatin]     Seasonal allergies   REVIEW OF SYSTEMS (Negative unless checked)   Constitutional: '[]'$ Weight loss  '[]'$ Fever  '[]'$ Chills Cardiac: '[]'$ Chest pain   '[]'$ Chest pressure   '[]'$ Palpitations   '[]'$ Shortness of breath when laying flat   '[]'$ Shortness of breath at rest   '[]'$ Shortness of breath with exertion. Vascular:  '[x]'$ Pain in legs with walking   '[]'$ Pain in legs at rest   '[]'$ Pain in legs when laying flat   '[x]'$ Claudication   '[]'$ Pain in feet when walking  '[]'$ Pain in feet at rest  '[]'$ Pain in feet when laying flat   '[]'$ History of DVT   '[]'$ Phlebitis   '[x]'$ Swelling in legs   '[]'$ Varicose veins   '[]'$ Non-healing ulcers Pulmonary:   '[]'$ Uses home oxygen   '[]'$ Productive cough   '[]'$ Hemoptysis   '[]'$ Wheeze  '[]'$ COPD   '[]'$ Asthma Neurologic:  '[]'$ Dizziness  '[]'$ Blackouts   '[]'$ Seizures   '[]'$ History of stroke   '[]'$ History of TIA  '[]'$ Aphasia   '[]'$ Temporary blindness   '[]'$ Dysphagia   '[]'$ Weakness or numbness in arms   '[]'$ Weakness or numbness in legs Musculoskeletal:  '[x]'$ Arthritis   '[]'$ Joint swelling   '[]'$ Joint pain   '[]'$ Low back pain Hematologic:  '[]'$ Easy bruising  '[]'$ Easy bleeding   '[]'$ Hypercoagulable state   '[]'$ Anemic   Gastrointestinal:  '[]'$ Blood in stool   '[]'$ Vomiting blood  '[]'$ Gastroesophageal reflux/heartburn   '[]'$ Abdominal pain Genitourinary:  '[]'$ Chronic kidney disease   '[]'$ Difficult urination  '[]'$ Frequent urination  '[]'$ Burning with urination   '[]'$ Hematuria Skin:  '[]'$ Rashes   '[]'$ Ulcers   '[]'$ Wounds Psychological:  '[]'$ History of anxiety   '[]'$  History of major depression.  Physical Examination  BP (!) 144/79 (BP Location: Left Arm)   Pulse 73   Resp 17   Ht '5\' 4"'$  (1.626 m)   Wt 186 lb (84.4 kg)   BMI 31.93 kg/m  Gen:  WD/WN, NAD Head: [place]/AT, No temporalis  wasting. Ear/Nose/Throat: Hearing grossly intact, nares w/o erythema or drainage Eyes: Conjunctiva clear. Sclera non-icteric Neck: Supple.  Trachea midline Pulmonary:  Good air movement, no use of accessory muscles.  Cardiac: RRR, no JVD Vascular:  Vessel Right Left  Radial Palpable Palpable                          PT 1+  palpable 1+ palpable  DP 2+ palpable 1+ palpable   Gastrointestinal: soft, non-tender/non-distended. No guarding/reflex.  Musculoskeletal: M/S 5/5 throughout.  No deformity or atrophy.  No significant lower extremity edema. Neurologic: Sensation grossly intact in extremities.  Symmetrical.  Speech is fluent.  Psychiatric: Judgment intact, Mood & affect appropriate for pt's clinical situation. Dermatologic: No rashes or ulcers noted.  No cellulitis or open wounds.      Labs Recent Results (from the past 2160 hour(s))  Urinalysis, Complete     Status: None   Collection Time: 04/10/21  9:10 AM  Result Value Ref Range   Specific Gravity, UA 1.015 1.005 - 1.030   pH, UA 5.5 5.0 - 7.5   Color, UA Yellow Yellow   Appearance Ur Clear Clear   Leukocytes,UA Negative Negative   Protein,UA Negative Negative/Trace   Glucose, UA Negative Negative   Ketones, UA Negative Negative   RBC, UA Negative Negative   Bilirubin, UA Negative Negative   Urobilinogen, Ur 0.2 0.2 - 1.0 mg/dL   Nitrite, UA Negative Negative   Microscopic Examination See below:   Microscopic Examination     Status: None   Collection Time: 04/10/21  9:10 AM   Urine  Result Value Ref Range   WBC, UA 0-5 0 - 5 /hpf   RBC 0-2 0 - 2 /hpf   Epithelial Cells (non renal) 0-10 0 - 10 /hpf   Bacteria, UA None seen None seen/Few  I-STAT creatinine     Status: None   Collection Time: 05/17/21  9:09 AM  Result Value Ref Range   Creatinine, Ser 0.70 0.44 - 1.00 mg/dL  Urinalysis, Complete     Status: Abnormal   Collection Time: 05/25/21  8:57 AM  Result Value Ref Range   Specific Gravity, UA 1.015  1.005 - 1.030   pH, UA 5.5 5.0 - 7.5   Color, UA Yellow Yellow   Appearance Ur Clear Clear   Leukocytes,UA Negative Negative   Protein,UA Negative Negative/Trace   Glucose, UA Negative Negative   Ketones, UA Negative Negative   RBC, UA Trace (A) Negative   Bilirubin, UA Negative Negative   Urobilinogen, Ur 0.2 0.2 - 1.0 mg/dL   Nitrite, UA Negative Negative   Microscopic Examination See below:   Microscopic Examination     Status: None   Collection Time: 05/25/21  8:57 AM   Urine  Result Value Ref Range   WBC, UA None seen 0 - 5 /hpf   RBC 0-2 0 - 2 /hpf   Epithelial Cells (non renal) 0-10 0 - 10 /hpf   Bacteria, UA None seen None seen/Few    Radiology No results found.  Assessment/Plan Benign essential hypertension blood pressure control important in reducing the progression of atherosclerotic disease. On appropriate oral medications.  Atherosclerosis of native arteries of extremity with intermittent claudication (HCC) ABIs today are 0.93 on the right and have dropped a bit to 0.86 on the left.  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 We discussed the situation in detail today.  The patient has had some recurrent stenosis below previous interventions.  Her symptoms are currently fairly mild.  At this point, I have offered her intervention to address the left popliteal stenosis versus a shortened interval follow-up with continued medical regimen.  She would prefer the latter and with mild symptoms we will plan on a 47-monthfollow-up interval.  If she has worsening symptoms she will contact our office.  She is again congratulated on smoking cessation.    [name], MD  07/04/2021 11:04 AM    This note was created with Dragon medical transcription system.  Any errors from dictation are purely unintentional

## 2021-08-28 ENCOUNTER — Other Ambulatory Visit (INDEPENDENT_AMBULATORY_CARE_PROVIDER_SITE_OTHER): Payer: Self-pay | Admitting: Vascular Surgery

## 2021-08-28 DIAGNOSIS — I739 Peripheral vascular disease, unspecified: Secondary | ICD-10-CM

## 2021-08-29 ENCOUNTER — Ambulatory Visit (INDEPENDENT_AMBULATORY_CARE_PROVIDER_SITE_OTHER): Payer: Medicare HMO | Admitting: Vascular Surgery

## 2021-08-29 ENCOUNTER — Ambulatory Visit (INDEPENDENT_AMBULATORY_CARE_PROVIDER_SITE_OTHER): Payer: Medicare HMO

## 2021-08-29 ENCOUNTER — Encounter (INDEPENDENT_AMBULATORY_CARE_PROVIDER_SITE_OTHER): Payer: Self-pay | Admitting: Vascular Surgery

## 2021-08-29 VITALS — BP 152/77 | HR 66 | Resp 16 | Wt 187.8 lb

## 2021-08-29 DIAGNOSIS — I70213 Atherosclerosis of native arteries of extremities with intermittent claudication, bilateral legs: Secondary | ICD-10-CM

## 2021-08-29 DIAGNOSIS — I10 Essential (primary) hypertension: Secondary | ICD-10-CM | POA: Diagnosis not present

## 2021-08-29 DIAGNOSIS — M7989 Other specified soft tissue disorders: Secondary | ICD-10-CM | POA: Diagnosis not present

## 2021-08-29 DIAGNOSIS — I739 Peripheral vascular disease, unspecified: Secondary | ICD-10-CM | POA: Diagnosis not present

## 2021-08-29 NOTE — Assessment & Plan Note (Signed)
We discussed the regular use of compression socks and elevation to help control her swelling.

## 2021-08-29 NOTE — Assessment & Plan Note (Signed)
Her noninvasive studies today show largely monophasic flow throughout both lower extremities without an obvious hemodynamically significant stenosis.  Her right ABI is stable at 0.94 and her left ABI has dropped some to 0.63 although her digital pressure is reduced but better than it was at her last visit. At current, she is not really having any worrisome symptoms.  The pain in her right medial thigh is likely neuropathic pain.  Her swelling can be managed with compression socks and is not related to poor perfusion.  Were going to keep a shortened interval follow-up given her drop in her ABI and see her back in 4 months.  Continue current medical regimen.

## 2021-08-29 NOTE — Progress Notes (Signed)
MRN : [id number]  Joan [name] is a 71 y.o. (1950/04/14) female who presents with chief complaint of  Chief Complaint  Patient presents with   Follow-up    Ultrasound follow up  .  History of Present Illness: Patient returns today in follow up of her PAD earlier than her scheduled visit.  She has been having right inner thigh pain.  She is not really having claudication symptoms.  No rest pain.  She is also having swelling in the right leg over the past few months.  Her noninvasive studies today show largely monophasic flow throughout both lower extremities without an obvious hemodynamically significant stenosis.  Her right ABI is stable at 0.94 and her left ABI has dropped some to 0.63 although her digital pressure is reduced but better than it was at her last visit.  Current Outpatient Medications  Medication Sig Dispense Refill   acetaminophen (TYLENOL) 325 MG tablet Take 1-2 tablets (325-650 mg total) by mouth every 4 (four) hours as needed for mild pain (or temp >/= 101 F). 25 tablet 1   ALPRAZolam (XANAX) 0.5 MG tablet Take 0.5 mg by mouth 2 (two) times daily as needed for anxiety.  0   aspirin EC 81 MG tablet Take 81 mg by mouth daily.     clopidogrel (PLAVIX) 75 MG tablet Take 1 tablet (75 mg total) by mouth daily. 30 tablet 6   loratadine (CLARITIN) 10 MG tablet Take 10 mg by mouth daily as needed for allergies.     losartan (COZAAR) 50 MG tablet Take by mouth.     pantoprazole (PROTONIX) 40 MG tablet Take by mouth.     sertraline (ZOLOFT) 100 MG tablet Take 100 mg by mouth daily.     No current facility-administered medications for this visit.    Past Medical History:  Diagnosis Date   Angina at rest [facility])    Anxiety    Arthritis    Breast cancer, right breast ([place])    s/p lumpectomy and adjuvant XRT   Bronchitis    Cancer ([place])    Claudication, intermittent ([place])    Depression    Dyspnea    Environmental and seasonal allergies    GERD (gastroesophageal reflux  disease)    with spicy food   Hepatitis C antibody test positive    Hypertension    Peripheral vascular disease ([place])    Personal history of radiation therapy 2002   RIGHT lumpectomy   TIA (transient ischemic attack)    no residual. Many years ago    Past Surgical History:  Procedure Laterality Date   APPENDECTOMY     BREAST EXCISIONAL BIOPSY Right    11/1999   BREAST LUMPECTOMY Right 2002   RIGHT lumpectomy w/ radiation 2002   BREAST SURGERY     CHOLECYSTECTOMY     ENDARTERECTOMY FEMORAL Bilateral 06/22/2020   Procedure: ENDARTERECTOMY FEMORAL;  Surgeon: Algernon Huxley, MD;  Location: [facility] ORS;  Service: Vascular;  Laterality: Bilateral;   INSERTION OF ILIAC STENT Bilateral 06/22/2020   Procedure: INSERTION OF ILIAC STENT;  Surgeon: Algernon Huxley, MD;  Location: [facility] OR[facility];  Service: Vascular;  Laterality: Bilateral;   LOWER EXTREMITY ANGIOGRAPHY Left 11/14/2017   Procedure: LOWER EXTREMITY ANGIOGRAPHY;  Surgeon: Algernon Huxley, MD;  Location: [place] CV LAB;  Service: Cardiovascular;  Laterality: Left;   LOWER EXTREMITY ANGIOGRAPHY Right 04/21/2020   Procedure: LOWER EXTREMITY ANGIOGRAPHY;  Surgeon: Algernon Huxley, MD;  Location: [place] CV LAB;  Service: Cardiovascular;  Laterality: Right;   LOWER EXTREMITY ANGIOGRAPHY Left 08/15/2020   Procedure: LOWER EXTREMITY ANGIOGRAPHY;  Surgeon: Algernon Huxley, MD;  Location: [place] CV LAB;  Service: Cardiovascular;  Laterality: Left;   LOWER EXTREMITY ANGIOGRAPHY Right 08/22/2020   Procedure: LOWER EXTREMITY ANGIOGRAPHY;  Surgeon: Algernon Huxley, MD;  Location: [place] [facility];  Service: Cardiovascular;  Laterality: Right;     Social History   Tobacco Use   Smoking status: Former    Packs/day: 0.50    Years: 50.00    Total pack years: 25.00    Types: Cigarettes   Smokeless tobacco: Never   Tobacco comments:    trying to quit on her own  Vaping Use   Vaping Use: Never used  Substance Use Topics   Alcohol use: Not  Currently    Comment: Beer heavy everyday in past - last 10 years ago   Drug use: No      Family History  Problem Relation Age of Onset   Breast cancer Mother 49   Breast cancer Maternal Aunt    Breast cancer Maternal Grandmother    Breast cancer Sister 3     Allergies  Allergen Reactions   Seasonal Ic [Cholestatin]     Seasonal allergies    REVIEW OF SYSTEMS (Negative unless checked)   Constitutional: '[]'$ Weight loss  '[]'$ Fever  '[]'$ Chills Cardiac: '[]'$ Chest pain   '[]'$ Chest pressure   '[]'$ Palpitations   '[]'$ Shortness of breath when laying flat   '[]'$ Shortness of breath at rest   '[]'$ Shortness of breath with exertion. Vascular:  '[x]'$ Pain in legs with walking   '[]'$ Pain in legs at rest   '[]'$ Pain in legs when laying flat   '[x]'$ Claudication   '[]'$ Pain in feet when walking  '[]'$ Pain in feet at rest  '[]'$ Pain in feet when laying flat   '[]'$ History of DVT   '[]'$ Phlebitis   '[x]'$ Swelling in legs   '[]'$ Varicose veins   '[]'$ Non-healing ulcers Pulmonary:   '[]'$ Uses home oxygen   '[]'$ Productive cough   '[]'$ Hemoptysis   '[]'$ Wheeze  '[]'$ COPD   '[]'$ Asthma Neurologic:  '[]'$ Dizziness  '[]'$ Blackouts   '[]'$ Seizures   '[]'$ History of stroke   '[]'$ History of TIA  '[]'$ Aphasia   '[]'$ Temporary blindness   '[]'$ Dysphagia   '[]'$ Weakness or numbness in arms   '[]'$ Weakness or numbness in legs Musculoskeletal:  '[x]'$ Arthritis   '[]'$ Joint swelling   '[]'$ Joint pain   '[]'$ Low back pain Hematologic:  '[]'$ Easy bruising  '[]'$ Easy bleeding   '[]'$ Hypercoagulable state   '[]'$ Anemic   Gastrointestinal:  '[]'$ Blood in stool   '[]'$ Vomiting blood  '[]'$ Gastroesophageal reflux/heartburn   '[]'$ Abdominal pain Genitourinary:  '[]'$ Chronic kidney disease   '[]'$ Difficult urination  '[]'$ Frequent urination  '[]'$ Burning with urination   '[]'$ Hematuria Skin:  '[]'$ Rashes   '[]'$ Ulcers   '[]'$ Wounds Psychological:  '[]'$ History of anxiety   '[]'$  History of major depression.  Physical Examination  BP (!) 152/77 (BP Location: Right Arm)   Pulse 66   Resp 16   Wt 187 lb 12.8 oz (85.2 kg)   BMI 32.24 kg/m  Gen:  WD/WN, NAD Head: [place]/AT, No  temporalis wasting. Ear/Nose/Throat: Hearing grossly intact, nares w/o erythema or drainage Eyes: Conjunctiva clear. Sclera non-icteric Neck: Supple.  Trachea midline Pulmonary:  Good air movement, no use of accessory muscles.  Cardiac: RRR, no JVD Vascular:  Vessel Right Left  Radial Palpable Palpable                          PT 1+ palpable  1+ palpable  DP 2+ palpable 1+ palpable   Gastrointestinal: soft, non-tender/non-distended. No guarding/reflex.  Musculoskeletal: M/S 5/5 throughout.  No deformity or atrophy.  1+ right lower extremity edema. Neurologic: Sensation grossly intact in extremities.  Symmetrical.  Speech is fluent.  Psychiatric: Judgment intact, Mood & affect appropriate for pt's clinical situation. Dermatologic: No rashes or ulcers noted.  No cellulitis or open wounds.      Labs No results found for this or any previous visit (from the past 2160 hour(s)).  Radiology No results found.  Assessment/Plan Benign essential hypertension blood pressure control important in reducing the progression of atherosclerotic disease. On appropriate oral medications.  Atherosclerosis of native arteries of extremity with intermittent claudication (HCC) Her noninvasive studies today show largely monophasic flow throughout both lower extremities without an obvious hemodynamically significant stenosis.  Her right ABI is stable at 0.94 and her left ABI has dropped some to 0.63 although her digital pressure is reduced but better than it was at her last visit. At current, she is not really having any worrisome symptoms.  The pain in her right medial thigh is likely neuropathic pain.  Her swelling can be managed with compression socks and is not related to poor perfusion.  Were going to keep a shortened interval follow-up given her drop in her ABI and see her back in 4 months.  Continue current medical regimen.  Swelling of limb We discussed the regular use of compression socks and  elevation to help control her swelling.    [name], MD  08/29/2021 2:52 PM    This note was created with Dragon medical transcription system.  Any errors from dictation are purely unintentional

## 2021-09-05 DIAGNOSIS — F3340 Major depressive disorder, recurrent, in remission, unspecified: Secondary | ICD-10-CM | POA: Diagnosis not present

## 2021-09-05 DIAGNOSIS — I10 Essential (primary) hypertension: Secondary | ICD-10-CM | POA: Diagnosis not present

## 2021-09-05 DIAGNOSIS — I739 Peripheral vascular disease, unspecified: Secondary | ICD-10-CM | POA: Diagnosis not present

## 2021-09-14 ENCOUNTER — Other Ambulatory Visit: Payer: Self-pay

## 2021-09-14 ENCOUNTER — Inpatient Hospital Stay
Admission: EM | Admit: 2021-09-14 | Discharge: 2021-09-19 | DRG: 871 | Disposition: A | Discharge status: Home / Self Care | Payer: Medicare HMO | Attending: Internal Medicine | Admitting: Internal Medicine

## 2021-09-14 ENCOUNTER — Emergency Department: Payer: Medicare HMO

## 2021-09-14 DIAGNOSIS — Z20822 Contact with and (suspected) exposure to covid-19: Secondary | ICD-10-CM | POA: Diagnosis not present

## 2021-09-14 DIAGNOSIS — Z803 Family history of malignant neoplasm of breast: Secondary | ICD-10-CM

## 2021-09-14 DIAGNOSIS — J441 Chronic obstructive pulmonary disease with (acute) exacerbation: Secondary | ICD-10-CM | POA: Diagnosis not present

## 2021-09-14 DIAGNOSIS — I10 Essential (primary) hypertension: Secondary | ICD-10-CM | POA: Diagnosis not present

## 2021-09-14 DIAGNOSIS — F32A Depression, unspecified: Secondary | ICD-10-CM | POA: Diagnosis not present

## 2021-09-14 DIAGNOSIS — K219 Gastro-esophageal reflux disease without esophagitis: Secondary | ICD-10-CM | POA: Diagnosis present

## 2021-09-14 DIAGNOSIS — R778 Other specified abnormalities of plasma proteins: Secondary | ICD-10-CM | POA: Diagnosis not present

## 2021-09-14 DIAGNOSIS — R059 Cough, unspecified: Secondary | ICD-10-CM | POA: Diagnosis not present

## 2021-09-14 DIAGNOSIS — Z95820 Peripheral vascular angioplasty status with implants and grafts: Secondary | ICD-10-CM | POA: Diagnosis not present

## 2021-09-14 DIAGNOSIS — R6520 Severe sepsis without septic shock: Secondary | ICD-10-CM | POA: Diagnosis not present

## 2021-09-14 DIAGNOSIS — Z923 Personal history of irradiation: Secondary | ICD-10-CM

## 2021-09-14 DIAGNOSIS — Z853 Personal history of malignant neoplasm of breast: Secondary | ICD-10-CM

## 2021-09-14 DIAGNOSIS — I248 Other forms of acute ischemic heart disease: Secondary | ICD-10-CM | POA: Diagnosis present

## 2021-09-14 DIAGNOSIS — E872 Acidosis, unspecified: Secondary | ICD-10-CM | POA: Diagnosis not present

## 2021-09-14 DIAGNOSIS — F419 Anxiety disorder, unspecified: Secondary | ICD-10-CM | POA: Diagnosis present

## 2021-09-14 DIAGNOSIS — Z8673 Personal history of transient ischemic attack (TIA), and cerebral infarction without residual deficits: Secondary | ICD-10-CM | POA: Diagnosis not present

## 2021-09-14 DIAGNOSIS — R0902 Hypoxemia: Secondary | ICD-10-CM | POA: Diagnosis not present

## 2021-09-14 DIAGNOSIS — J9601 Acute respiratory failure with hypoxia: Secondary | ICD-10-CM | POA: Diagnosis not present

## 2021-09-14 DIAGNOSIS — A419 Sepsis, unspecified organism: Secondary | ICD-10-CM | POA: Diagnosis not present

## 2021-09-14 DIAGNOSIS — Z87891 Personal history of nicotine dependence: Secondary | ICD-10-CM | POA: Diagnosis not present

## 2021-09-14 DIAGNOSIS — Z9221 Personal history of antineoplastic chemotherapy: Secondary | ICD-10-CM | POA: Diagnosis not present

## 2021-09-14 DIAGNOSIS — R000 Tachycardia, unspecified: Secondary | ICD-10-CM | POA: Diagnosis not present

## 2021-09-14 DIAGNOSIS — R0602 Shortness of breath: Secondary | ICD-10-CM | POA: Diagnosis not present

## 2021-09-14 DIAGNOSIS — I739 Peripheral vascular disease, unspecified: Secondary | ICD-10-CM | POA: Diagnosis present

## 2021-09-14 DIAGNOSIS — I7389 Other specified peripheral vascular diseases: Secondary | ICD-10-CM | POA: Diagnosis not present

## 2021-09-14 DIAGNOSIS — Z7982 Long term (current) use of aspirin: Secondary | ICD-10-CM

## 2021-09-14 DIAGNOSIS — I2510 Atherosclerotic heart disease of native coronary artery without angina pectoris: Secondary | ICD-10-CM | POA: Diagnosis present

## 2021-09-14 DIAGNOSIS — R509 Fever, unspecified: Secondary | ICD-10-CM | POA: Diagnosis not present

## 2021-09-14 DIAGNOSIS — J189 Pneumonia, unspecified organism: Secondary | ICD-10-CM | POA: Diagnosis not present

## 2021-09-14 DIAGNOSIS — R7989 Other specified abnormal findings of blood chemistry: Secondary | ICD-10-CM | POA: Diagnosis present

## 2021-09-14 DIAGNOSIS — R4702 Dysphasia: Secondary | ICD-10-CM | POA: Diagnosis not present

## 2021-09-14 DIAGNOSIS — R0689 Other abnormalities of breathing: Secondary | ICD-10-CM | POA: Diagnosis not present

## 2021-09-14 DIAGNOSIS — Z79899 Other long term (current) drug therapy: Secondary | ICD-10-CM

## 2021-09-14 DIAGNOSIS — Z7902 Long term (current) use of antithrombotics/antiplatelets: Secondary | ICD-10-CM

## 2021-09-14 DIAGNOSIS — J302 Other seasonal allergic rhinitis: Secondary | ICD-10-CM | POA: Diagnosis present

## 2021-09-14 LAB — LACTIC ACID, PLASMA
Lactic Acid, Venous: 2.6 mmol/L (ref 0.5–1.9)
Lactic Acid, Venous: 2.6 mmol/L (ref 0.5–1.9)
Lactic Acid, Venous: 1.4 mmol/L (ref 0.5–1.9)

## 2021-09-14 LAB — CBC WITH DIFFERENTIAL/PLATELET
Abs Immature Granulocytes: 0.16 10*3/uL — ABNORMAL HIGH (ref 0.00–0.07)
Basophils Absolute: 0.1 10*3/uL (ref 0.0–0.1)
Basophils Relative: 0 %
Eosinophils Absolute: 0 10*3/uL (ref 0.0–0.5)
Eosinophils Relative: 0 %
HCT: 41.4 % (ref 36.0–46.0)
Hemoglobin: 13.1 g/dL (ref 12.0–15.0)
Immature Granulocytes: 1 %
Lymphocytes Relative: 8 %
Lymphs Abs: 1.7 10*3/uL (ref 0.7–4.0)
MCH: 28.5 pg (ref 26.0–34.0)
MCHC: 31.6 g/dL (ref 30.0–36.0)
MCV: 90 fL (ref 80.0–100.0)
Monocytes Absolute: 1.2 10*3/uL — ABNORMAL HIGH (ref 0.1–1.0)
Monocytes Relative: 5 %
Neutro Abs: 18.8 10*3/uL — ABNORMAL HIGH (ref 1.7–7.7)
Neutrophils Relative %: 86 %
Platelets: 281 10*3/uL (ref 150–400)
RBC: 4.6 MIL/uL (ref 3.87–5.11)
RDW: 14.1 % (ref 11.5–15.5)
WBC: 21.9 10*3/uL — ABNORMAL HIGH (ref 4.0–10.5)
nRBC: 0 % (ref 0.0–0.2)

## 2021-09-14 LAB — COMPREHENSIVE METABOLIC PANEL
ALT: 43 U/L (ref 0–44)
AST: 44 U/L — ABNORMAL HIGH (ref 15–41)
Albumin: 4 g/dL (ref 3.5–5.0)
Alkaline Phosphatase: 80 U/L (ref 38–126)
Anion gap: 10 (ref 5–15)
BUN: 13 mg/dL (ref 8–23)
CO2: 22 mmol/L (ref 22–32)
Calcium: 8.9 mg/dL (ref 8.9–10.3)
Chloride: 105 mmol/L (ref 98–111)
Creatinine, Ser: 0.74 mg/dL (ref 0.44–1.00)
GFR, Estimated: >60 mL/min (ref >60)
Glucose, Bld: 133 mg/dL — ABNORMAL HIGH (ref 70–99)
Potassium: 4.1 mmol/L (ref 3.5–5.1)
Sodium: 137 mmol/L (ref 135–145)
Total Bilirubin: 0.6 mg/dL (ref 0.3–1.2)
Total Protein: 7.5 g/dL (ref 6.5–8.1)

## 2021-09-14 LAB — RESP PANEL BY RT-PCR (FLU A&B, COVID) ARPGX2
Influenza A by PCR: NEGATIVE
Influenza B by PCR: NEGATIVE
SARS Coronavirus 2 by RT PCR: NEGATIVE

## 2021-09-14 LAB — TROPONIN I (HIGH SENSITIVITY)
Troponin I (High Sensitivity): 486 ng/L (ref <18)
Troponin I (High Sensitivity): 559 ng/L (ref <18)

## 2021-09-14 MED ORDER — IPRATROPIUM-ALBUTEROL 0.5-2.5 (3) MG/3ML IN SOLN: 3.0000 mL | Freq: Four times a day (QID) | RESPIRATORY_TRACT | Status: DC | PRN

## 2021-09-14 MED ORDER — LACTATED RINGERS IV SOLN: INTRAVENOUS | Status: DC

## 2021-09-14 MED ORDER — SODIUM CHLORIDE 0.9 % IV SOLN: 500.0000 mg | INTRAVENOUS | Status: DC

## 2021-09-14 MED ORDER — SODIUM CHLORIDE 0.9 % IV BOLUS (SEPSIS)
1000.0000 mL | Freq: Once | INTRAVENOUS | Status: AC
  Administered 2021-09-14: 1000 mL via INTRAVENOUS

## 2021-09-14 MED ORDER — SODIUM CHLORIDE 0.9 % IV SOLN: 2.0000 g | INTRAVENOUS | Status: DC

## 2021-09-14 MED ORDER — ASPIRIN 81 MG PO TBEC
81.0000 mg | DELAYED_RELEASE_TABLET | Freq: Every day | ORAL | Status: DC
  Administered 2021-09-15 – 2021-09-19 (×5): 81 mg via ORAL
  Filled 2021-09-14 (×5): qty 1

## 2021-09-14 MED ORDER — ACETAMINOPHEN 325 MG PO TABS: 325.0000 mg | ORAL_TABLET | ORAL | Status: DC | PRN

## 2021-09-14 MED ORDER — ALPRAZOLAM 0.5 MG PO TABS
0.5000 mg | ORAL_TABLET | Freq: Two times a day (BID) | ORAL | Status: DC | PRN
  Administered 2021-09-15 – 2021-09-18 (×4): 0.5 mg via ORAL
  Filled 2021-09-14 (×4): qty 1

## 2021-09-14 MED ORDER — SODIUM CHLORIDE 0.9 % IV SOLN
2.0000 g | INTRAVENOUS | Status: DC
  Administered 2021-09-14 – 2021-09-17 (×4): 2 g via INTRAVENOUS
  Filled 2021-09-14 (×5): qty 20

## 2021-09-14 MED ORDER — SERTRALINE HCL 50 MG PO TABS
100.0000 mg | ORAL_TABLET | Freq: Every day | ORAL | Status: DC
  Administered 2021-09-15 – 2021-09-19 (×5): 100 mg via ORAL
  Filled 2021-09-14 (×5): qty 2

## 2021-09-14 MED ORDER — IPRATROPIUM-ALBUTEROL 0.5-2.5 (3) MG/3ML IN SOLN
3.0000 mL | Freq: Once | RESPIRATORY_TRACT | Status: AC
  Administered 2021-09-14: 3 mL via RESPIRATORY_TRACT
  Filled 2021-09-14: qty 3

## 2021-09-14 MED ORDER — SODIUM CHLORIDE 0.9 % IV SOLN
500.0000 mg | INTRAVENOUS | Status: DC
  Administered 2021-09-14 – 2021-09-15 (×2): 500 mg via INTRAVENOUS
  Filled 2021-09-14: qty 5
  Filled 2021-09-14: qty 500
  Filled 2021-09-14: qty 5

## 2021-09-14 MED ORDER — LOSARTAN POTASSIUM 50 MG PO TABS
50.0000 mg | ORAL_TABLET | Freq: Every day | ORAL | Status: DC
  Administered 2021-09-15 – 2021-09-19 (×5): 50 mg via ORAL
  Filled 2021-09-14 (×5): qty 1

## 2021-09-14 MED ORDER — ONDANSETRON HCL 4 MG PO TABS: 4.0000 mg | ORAL_TABLET | Freq: Four times a day (QID) | ORAL | Status: DC | PRN

## 2021-09-14 MED ORDER — ACETAMINOPHEN 325 MG PO TABS
650.0000 mg | ORAL_TABLET | Freq: Once | ORAL | Status: AC
  Administered 2021-09-14: 650 mg via ORAL
  Filled 2021-09-14: qty 2

## 2021-09-14 MED ORDER — PANTOPRAZOLE SODIUM 40 MG PO TBEC
40.0000 mg | DELAYED_RELEASE_TABLET | Freq: Every day | ORAL | Status: DC
  Administered 2021-09-15 – 2021-09-19 (×5): 40 mg via ORAL
  Filled 2021-09-14 (×5): qty 1

## 2021-09-14 MED ORDER — ENOXAPARIN SODIUM 40 MG/0.4ML IJ SOSY
40.0000 mg | PREFILLED_SYRINGE | INTRAMUSCULAR | Status: DC
  Administered 2021-09-14 – 2021-09-18 (×5): 40 mg via SUBCUTANEOUS
  Filled 2021-09-14 (×5): qty 0.4

## 2021-09-14 MED ORDER — CLOPIDOGREL BISULFATE 75 MG PO TABS
75.0000 mg | ORAL_TABLET | Freq: Every day | ORAL | Status: DC
  Administered 2021-09-15 – 2021-09-19 (×5): 75 mg via ORAL
  Filled 2021-09-14 (×5): qty 1

## 2021-09-14 MED ORDER — ONDANSETRON HCL 4 MG/2ML IJ SOLN: 4.0000 mg | Freq: Four times a day (QID) | INTRAMUSCULAR | Status: DC | PRN

## 2021-09-14 NOTE — Assessment & Plan Note (Addendum)
As evidenced by low-grade fever with a Tmax of 100.6, tachycardia, marked leukocytosis and lactic acidosis with imaging showing a left lower lobe pneumonia. Associated with hypoxia with room air pulse oximetry of 88% requiring oxygen supplementation at 2 L to maintain pulse oximetry greater than 92%. Trend lactic acid levels Aggressive IV fluid resuscitation Place patient on Rocephin and Zithromax Follow-up results of blood cultures

## 2021-09-14 NOTE — H&P (Signed)
History and Physical    Patient: Joan [name]:[id number] DOB: December 05, 1950 DOA: 09/14/2021 DOS: the patient was seen and examined on 09/14/2021 PCP: Tracie Harrier, MD  Patient coming from: Home  Chief Complaint:  Chief Complaint  Patient presents with   Shortness of Breath   HPI: Joan [name] is a 71 y.o. female with medical history significant for peripheral arterial disease status post peripheral stent angioplasty, history of coronary artery disease, history of breast cancer status postlumpectomy and adjuvant chemotherapy, history of GERD who presents to the ER via EMS for evaluation of worsening shortness of breath. Patient states that she has had symptoms for several weeks but worse the night prior to her admission.  Shortness of breath is not related to rest or exertion and is associated with a cough productive of clear phlegm as well as chills she denies having any fever.  She complains of a sinus headache and has had some postnasal drainage.  She also has had nausea and vomiting.  Per EMS she had room air pulse oximetry in the high 80s and was placed on 3 L of oxygen with improvement in her pulse oximetry. She denies having any chest pain, no dizziness, no lightheadedness, no urinary symptoms, no abdominal pain, no leg swelling, no orthopnea, no blurred vision no focal deficit. Labs show a white count of 21.9, lactic acid level of 2.6 and troponin of 486 Chest x-ray shows possible infiltrate in the left lung Twelve-lead EKG reviewed by me shows sinus rhythm with PVCs and nonspecific ST changes. She received a dose of Rocephin and Zithromax in the ER as well as 1 L of normal saline.  Review of Systems: As mentioned in the history of present illness. All other systems reviewed and are negative. Past Medical History:  Diagnosis Date   Angina at rest [facility])    Anxiety    Arthritis    Breast cancer, right breast ([place])    s/p lumpectomy and adjuvant XRT   Bronchitis    Cancer  ([place])    Claudication, intermittent ([place])    Depression    Dyspnea    Environmental and seasonal allergies    GERD (gastroesophageal reflux disease)    with spicy food   Hepatitis C antibody test positive    Hypertension    Peripheral vascular disease ([place])    Personal history of radiation therapy 2002   RIGHT lumpectomy   TIA (transient ischemic attack)    no residual. Many years ago   Past Surgical History:  Procedure Laterality Date   APPENDECTOMY     BREAST EXCISIONAL BIOPSY Right    11/1999   BREAST LUMPECTOMY Right 2002   RIGHT lumpectomy w/ radiation 2002   BREAST SURGERY     CHOLECYSTECTOMY     ENDARTERECTOMY FEMORAL Bilateral 06/22/2020   Procedure: ENDARTERECTOMY FEMORAL;  Surgeon: Algernon Huxley, MD;  Location: [facility] ORS;  Service: Vascular;  Laterality: Bilateral;   INSERTION OF ILIAC STENT Bilateral 06/22/2020   Procedure: INSERTION OF ILIAC STENT;  Surgeon: Algernon Huxley, MD;  Location: [facility] ORS;  Service: Vascular;  Laterality: Bilateral;   LOWER EXTREMITY ANGIOGRAPHY Left 11/14/2017   Procedure: LOWER EXTREMITY ANGIOGRAPHY;  Surgeon: Algernon Huxley, MD;  Location: [place] [facility];  Service: Cardiovascular;  Laterality: Left;   LOWER EXTREMITY ANGIOGRAPHY Right 04/21/2020   Procedure: LOWER EXTREMITY ANGIOGRAPHY;  Surgeon: Algernon Huxley, MD;  Location: [place] [facility];  Service: Cardiovascular;  Laterality: Right;   LOWER EXTREMITY ANGIOGRAPHY  Left 08/15/2020   Procedure: LOWER EXTREMITY ANGIOGRAPHY;  Surgeon: Algernon Huxley, MD;  Location: [place] [facility];  Service: Cardiovascular;  Laterality: Left;   LOWER EXTREMITY ANGIOGRAPHY Right 08/22/2020   Procedure: LOWER EXTREMITY ANGIOGRAPHY;  Surgeon: Algernon Huxley, MD;  Location: [place] [facility];  Service: Cardiovascular;  Laterality: Right;   Social History:  reports that she has quit smoking. Her smoking use included cigarettes. She has a 25.00 pack-year smoking history. She has never used smokeless tobacco. She  reports that she does not currently use alcohol. She reports that she does not use drugs.  Allergies  Allergen Reactions   Seasonal Ic [Cholestatin]     Seasonal allergies    Family History  Problem Relation Age of Onset   Breast cancer Mother 43   Breast cancer Maternal Aunt    Breast cancer Maternal Grandmother    Breast cancer Sister 33    Prior to Admission medications   Medication Sig Start Date End Date Taking? Authorizing Provider  acetaminophen (TYLENOL) 325 MG tablet Take 1-2 tablets (325-650 mg total) by mouth every 4 (four) hours as needed for mild pain (or temp >/= 101 F). 06/25/20  Yes Elmore Guise, MD  ALPRAZolam Duanne Moron) 0.5 MG tablet Take 0.5 mg by mouth 2 (two) times daily as needed for anxiety. 09/18/17  Yes [provider]  aspirin EC 81 MG tablet Take 81 mg by mouth daily.   Yes [provider]  clopidogrel (PLAVIX) 75 MG tablet Take 1 tablet (75 mg total) by mouth daily. 06/13/19  Yes Kris Hartmann, NP  losartan (COZAAR) 50 MG tablet Take by mouth. 02/11/21  Yes [provider]  pantoprazole (PROTONIX) 40 MG tablet Take by mouth. 03/24/21 03/24/22 Yes [provider]  sertraline (ZOLOFT) 100 MG tablet Take 100 mg by mouth daily. 10/16/17  Yes [provider]  loratadine (CLARITIN) 10 MG tablet Take 10 mg by mouth daily as needed for allergies.    [provider]    Physical Exam: Vitals:   09/14/21 1306  BP: 126/72  Pulse: 100  Resp: 17  Temp: (!) 100.6 F (38.1 C)  TempSrc: Oral  SpO2: (!) 88%   Physical Exam Vitals and nursing note reviewed.  Constitutional:      Appearance: She is well-developed.  HENT:     Head: Normocephalic and atraumatic.  Cardiovascular:     Rate and Rhythm: Tachycardia present.  Pulmonary:     Effort: Pulmonary effort is normal.     Breath sounds: Normal breath sounds.  Abdominal:     General: Bowel sounds are normal.     Palpations: Abdomen is soft.  Musculoskeletal:         General: Normal range of motion.     Cervical back: Normal range of motion and neck supple.  Skin:    General: Skin is warm and dry.  Neurological:     General: No focal deficit present.     Mental Status: She is alert.  Psychiatric:        Mood and Affect: Mood normal.        Behavior: Behavior normal.     Data Reviewed: Relevant notes from primary care and specialist visits, past discharge summaries as available in EHR, including Care Everywhere. Prior diagnostic testing as pertinent to current admission diagnoses Updated medications and problem lists for reconciliation ED course, including vitals, labs, imaging, treatment and response to treatment Triage notes, nursing and pharmacy notes and ED provider's  notes Notable results as noted in HPI Labs reviewed.  Troponin 486, lactic acid 2.6, sodium 137, potassium 4.1, chloride 105, bicarb 22, glucose 133, BUN 13, creatinine 0.74, calcium 8.9, total protein 7.5, albumin 4.0, AST 44, ALT 43, alk phos 80, total bilirubin 0.6, white count 21.9, hemoglobin 13.1, hematocrit 41.4, platelet count 281 Respiratory viral panel is negative Chest x-ray reviewed by me shows the right lung is clear.  Mild airspace disease in the left lung Twelve-lead EKG reviewed by me shows sinus tachycardia with occasional PVCs.  Nonspecific ST changes There are no new results to review at this time.  Assessment and Plan: * Sepsis ([place]) As evidenced by low-grade fever with a Tmax of 100.6, tachycardia, marked leukocytosis and lactic acidosis with imaging showing a left lower lobe pneumonia. Associated with hypoxia with room air pulse oximetry of 88% requiring oxygen supplementation at 2 L to maintain pulse oximetry greater than 92%. Trend lactic acid levels Aggressive IV fluid resuscitation Place patient on Rocephin and Zithromax Follow-up results of blood cultures  Community acquired pneumonia Treatment as outlined in 1  Elevated troponin Probably  secondary to demand ischemia from sepsis and hypoxia We will trend troponin levels Continue aspirin and Plavix Obtain 2D echocardiogram to assess LVEF and rule out regional wall motion abnormality Consult cardiology  Anxiety and depression Stable Current alprazolam and sertraline      Advance Care Planning:   Code Status: Full Code   Consults: None  Family Communication: Greater than 50% of time was spent discussing patient's condition and plan of care with her at the bedside.  All questions and concerns have been addressed.  She verbalizes understanding and agrees with the plan.  Severity of Illness: The appropriate patient status for this patient is INPATIENT. Inpatient status is judged to be reasonable and necessary in order to provide the required intensity of service to ensure the patient's safety. The patient's presenting symptoms, physical exam findings, and initial radiographic and laboratory data in the context of their chronic comorbidities is felt to place them at high risk for further clinical deterioration. Furthermore, it is not anticipated that the patient will be medically stable for discharge from the hospital within 2 midnights of admission.   * I certify that at the point of admission it is my clinical judgment that the patient will require inpatient hospital care spanning beyond 2 midnights from the point of admission due to high intensity of service, high risk for further deterioration and high frequency of surveillance required.*  Author: Collier Bullock, MD 09/14/2021 4:27 PM  For on call review [email or web address].

## 2021-09-14 NOTE — Assessment & Plan Note (Signed)
Treatment as outlined in 1 

## 2021-09-14 NOTE — Assessment & Plan Note (Signed)
Probably secondary to demand ischemia from sepsis and hypoxia We will trend troponin levels Continue aspirin and Plavix Obtain 2D echocardiogram to assess LVEF and rule out regional wall motion abnormality Consult cardiology

## 2021-09-14 NOTE — Code Documentation (Signed)
CODE SEPSIS - PHARMACY COMMUNICATION  **Broad Spectrum Antibiotics should be administered within 1 hour of Sepsis diagnosis**  Time Code Sepsis Called/Page Received: 1513  Antibiotics Ordered: Ceftriaxone + Azithromycin  Time of 1st antibiotic administration: [place] Jomaira Darr ,PharmD Clinical Pharmacist  09/14/2021  3:27 PM

## 2021-09-14 NOTE — Sepsis Progress Note (Signed)
Notified bedside nurse of need to administer antibiotics and fluid bolus.  

## 2021-09-14 NOTE — ED Provider Notes (Addendum)
[facility] Provider Note    Event Date/Time   First MD Initiated Contact with Patient 09/14/21 1510     (approximate)   History   Shortness of Breath   HPI  Joan [name] is a 71 y.o. female extensive past medical history including alcohol abuse hyponatremia seizures significant PAD port of bronchitis presents to the ER for evaluation of fatigue cough shortness of breath concern for COVID symptoms.  Patient found to be hypoxic to the 80s.  Has been having cold chills.  Denies any history of COPD or asthma.  Has been having some nausea and vomiting.  Denies any abdominal pain no dysuria.     Physical Exam   Triage Vital Signs: ED Triage Vitals  Enc Vitals Group     BP 09/14/21 1306 126/72     Pulse Rate 09/14/21 1306 100     Resp 09/14/21 1306 17     Temp 09/14/21 1306 (!) 100.6 F (38.1 C)     Temp Source 09/14/21 1306 Oral     SpO2 09/14/21 1306 (!) 88 %     Weight --      Height --      Head Circumference --      Peak Flow --      Pain Score 09/14/21 1307 0     Pain Loc --      Pain Edu? --      Excl. in [place]? --     Most recent vital signs: Vitals:   09/14/21 1306  BP: 126/72  Pulse: 100  Resp: 17  Temp: (!) 100.6 F (38.1 C)  SpO2: (!) 88%     Constitutional: Alert  Eyes: Conjunctivae are normal.  Head: Atraumatic. Nose: No congestion/rhinnorhea. Mouth/Throat: Mucous membranes are moist.   Neck: Painless ROM.  Cardiovascular:   Good peripheral circulation. Respiratory: mild tachypnea, protecting airway, scattered posterior crackle Gastrointestinal: Soft and nontender.  Musculoskeletal:  no deformity Neurologic:  MAE spontaneously. No gross focal neurologic deficits are appreciated.  Skin:  Skin is warm, dry and intact. No rash noted. Psychiatric: Mood and affect are normal. Speech and behavior are normal.    ED Results / Procedures / Treatments   Labs (all labs ordered are listed, but only abnormal results are  displayed) Labs Reviewed  COMPREHENSIVE METABOLIC PANEL - Abnormal; Notable for the following components:      Result Value   Glucose, Bld 133 (*)    AST 44 (*)    All other components within normal limits  CBC WITH DIFFERENTIAL/PLATELET - Abnormal; Notable for the following components:   WBC 21.9 (*)    Neutro Abs 18.8 (*)    Monocytes Absolute 1.2 (*)    Abs Immature Granulocytes 0.16 (*)    All other components within normal limits  LACTIC ACID, PLASMA - Abnormal; Notable for the following components:   Lactic Acid, Venous 2.6 (*)    All other components within normal limits  TROPONIN I (HIGH SENSITIVITY) - Abnormal; Notable for the following components:   Troponin I (High Sensitivity) 486 (*)    All other components within normal limits  RESP PANEL BY RT-PCR (FLU A&B, COVID) ARPGX2  CULTURE, BLOOD (ROUTINE X 2)  CULTURE, BLOOD (ROUTINE X 2)  URINALYSIS, ROUTINE W REFLEX MICROSCOPIC  LACTIC ACID, PLASMA  TROPONIN I (HIGH SENSITIVITY)     EKG  ED ECG REPORT I, Merlyn Lot, the attending physician, personally viewed and interpreted this ECG.   Date: 09/14/2021  EKG Time: 13:25  Rate: 95  Rhythm: sinus  Axis: normal  Intervals:normal  ST&T Change: nonspecific st abn, no stemi    RADIOLOGY Please see ED Course for my review and interpretation.  I personally reviewed all radiographic images ordered to evaluate for the above acute complaints and reviewed radiology reports and findings.  These findings were personally discussed with the patient.  Please see medical record for radiology report.    PROCEDURES:  Critical Care performed: Yes, see critical care procedure note(s)  .Critical Care  Performed by: Merlyn Lot, MD Authorized by: Merlyn Lot, MD   Critical care provider statement:    Critical care time (minutes):  35   Critical care was necessary to treat or prevent imminent or life-threatening deterioration of the following conditions:   Sepsis and respiratory failure   Critical care was time spent personally by me on the following activities:  Ordering and performing treatments and interventions, ordering and review of laboratory studies, ordering and review of radiographic studies, pulse oximetry, re-evaluation of patient's condition, review of old charts, obtaining history from patient or surrogate, examination of patient, evaluation of patient's response to treatment, discussions with primary provider, discussions with consultants and development of treatment plan with patient or surrogate    MEDICATIONS ORDERED IN ED: Medications  sodium chloride 0.9 % bolus 1,000 mL (has no administration in time range)  cefTRIAXone (ROCEPHIN) 2 g in sodium chloride 0.9 % 100 mL IVPB (has no administration in time range)  azithromycin (ZITHROMAX) 500 mg in sodium chloride 0.9 % 250 mL IVPB (has no administration in time range)  acetaminophen (TYLENOL) tablet 650 mg (has no administration in time range)  ipratropium-albuterol (DUONEB) 0.5-2.5 (3) MG/3ML nebulizer solution 3 mL (has no administration in time range)     IMPRESSION / MDM / [place] / ED COURSE  I reviewed the triage vital signs and the nursing notes.                              Differential diagnosis includes, but is not limited to, Asthma, copd, CHF, pna, ptx, malignancy, Pe, anemia  Patient presented to the ER for evaluation of symptoms as described above.  This presenting complaint could reflect a potentially life-threatening illness therefore the patient will be placed on continuous pulse oximetry and telemetry for monitoring.  Laboratory evaluation will be sent to evaluate for the above complaints.  Chest x-ray on my review and interpretation does not show any evidence of pneumothorax does have evidence of infiltrate.  Given her fever white count lactate she is septic therefore IV antibiotics started for treatment of sepsis secondary to pneumonia.  Given her  acute respiratory failure with hypoxia requiring supplemental oxygen I do believe the patient will require admission to the hospital.  Will provide IV fluids but provide judicious fluids as she does have some lower extremity edema and she is not presenting in septic shock will trend lactates.  She does have some mild wheezing on exam we will give nebulizer treatment.  She is COVID-negative.  Troponin is elevated but I suspect this is demand ischemia in the setting of her sepsis with acute respiratory failure with hypoxia.  We will trend but this does not seem consistent with primary ACS.  I have consulted hospitalist for admission.     FINAL CLINICAL IMPRESSION(S) / ED DIAGNOSES   Final diagnoses:  Sepsis with acute hypoxic respiratory failure without septic shock, due to  unspecified organism [facility])     Rx / DC Orders   ED Discharge Orders     None        Note:  This document was prepared using Dragon voice recognition software and may include unintentional dictation errors.    Merlyn Lot, MD 09/14/21 1533    Merlyn Lot, MD 09/14/21 1538

## 2021-09-14 NOTE — Sepsis Progress Note (Signed)
Code sepsis protocol being monitored by eLink. 

## 2021-09-14 NOTE — ED Notes (Signed)
Pt comes via EMs with c/o covid symptoms for few days. EMS reports pt was given 1 duoneb and 1 albuterol. Pt's O2 was in high 80s and pt placed on 3L [place] with improvement.

## 2021-09-14 NOTE — ED Provider Triage Note (Signed)
Emergency Medicine Provider Triage Evaluation Note  Joan [name] , a 71 y.o. female  was evaluated in triage.  Pt complains of cough, fever, shortness of breath since yesterday.  She is not vaccinated against COVID-19.  When EMS arrived she was satting in the 52s.  She does not use oxygen at home.  Review of Systems  Positive: Cough, shortness of breath, fever Negative: Chest pain, abdominal pain, dysuria  Physical Exam  BP 126/72 (BP Location: Right Arm)   Pulse 100   Temp (!) 100.6 F (38.1 C) (Oral)   Resp 17   SpO2 (!) 88% Comment: placed on 2L/min Gen:   Awake, no distress.   Resp:  Normal effort, On 2 L nasal cannula MSK:   Moves extremities without difficulty  Other:    Medical Decision Making  Medically screening exam initiated at 1:18 PM.  Appropriate orders placed.  Joan [name] was informed that the remainder of the evaluation will be completed by another provider, this initial triage assessment does not replace that evaluation, and the importance of remaining in the ED until their evaluation is complete.     Marquette Old, PA-C 09/14/21 1319

## 2021-09-14 NOTE — Assessment & Plan Note (Signed)
Stable Current alprazolam and sertraline

## 2021-09-14 NOTE — ED Notes (Signed)
MD Pad informed of pt 's elevated trop and lactic

## 2021-09-14 NOTE — ED Triage Notes (Signed)
Pt presents to ED with c/o SOB that started last night. Pt also endorses "cold chills". Pt denies COPD or asthma. Pt also states N/V.

## 2021-09-14 NOTE — ED Notes (Addendum)
[place] sent to lab   1 set of [facility] sent

## 2021-09-15 ENCOUNTER — Inpatient Hospital Stay
Admit: 2021-09-15 | Discharge: 2021-09-15 | Disposition: A | Discharge status: Home / Self Care | Payer: Medicare HMO | Attending: Internal Medicine | Admitting: Internal Medicine

## 2021-09-15 ENCOUNTER — Encounter: Payer: Self-pay | Admitting: Internal Medicine

## 2021-09-15 DIAGNOSIS — J189 Pneumonia, unspecified organism: Secondary | ICD-10-CM | POA: Diagnosis not present

## 2021-09-15 DIAGNOSIS — A419 Sepsis, unspecified organism: Secondary | ICD-10-CM | POA: Diagnosis not present

## 2021-09-15 LAB — BASIC METABOLIC PANEL
Anion gap: 6 (ref 5–15)
BUN: 13 mg/dL (ref 8–23)
CO2: 25 mmol/L (ref 22–32)
Calcium: 8.8 mg/dL — ABNORMAL LOW (ref 8.9–10.3)
Chloride: 109 mmol/L (ref 98–111)
Creatinine, Ser: 0.64 mg/dL (ref 0.44–1.00)
GFR, Estimated: >60 mL/min (ref >60)
Glucose, Bld: 107 mg/dL — ABNORMAL HIGH (ref 70–99)
Potassium: 3.8 mmol/L (ref 3.5–5.1)
Sodium: 140 mmol/L (ref 135–145)

## 2021-09-15 LAB — ECHOCARDIOGRAM COMPLETE
AR max vel: 1.68 cm2
AV Area VTI: 1.58 cm2
AV Area mean vel: 1.69 cm2
AV Mean grad: 5 mmHg
AV Peak grad: 9.9 mmHg
Ao pk vel: 1.57 m/s
Area-P 1/2: 3.63 cm2
Height: 64 in
S' Lateral: 3 cm
Weight: 2992 oz

## 2021-09-15 LAB — CBC
HCT: 36.2 % (ref 36.0–46.0)
Hemoglobin: 11.5 g/dL — ABNORMAL LOW (ref 12.0–15.0)
MCH: 28 pg (ref 26.0–34.0)
MCHC: 31.8 g/dL (ref 30.0–36.0)
MCV: 88.3 fL (ref 80.0–100.0)
Platelets: 246 10*3/uL (ref 150–400)
RBC: 4.1 MIL/uL (ref 3.87–5.11)
RDW: 14.5 % (ref 11.5–15.5)
WBC: 17 10*3/uL — ABNORMAL HIGH (ref 4.0–10.5)
nRBC: 0 % (ref 0.0–0.2)

## 2021-09-15 LAB — CORTISOL-AM, BLOOD: Cortisol - AM: 12.2 ug/dL (ref 6.7–22.6)

## 2021-09-15 LAB — PROTIME-INR
INR: 1.2 (ref 0.8–1.2)
Prothrombin Time: 15.3 seconds — ABNORMAL HIGH (ref 11.4–15.2)

## 2021-09-15 LAB — TROPONIN I (HIGH SENSITIVITY)
Troponin I (High Sensitivity): 353 ng/L (ref <18)
Troponin I (High Sensitivity): 349 ng/L (ref <18)

## 2021-09-15 LAB — HIV ANTIBODY (ROUTINE TESTING W REFLEX): HIV Screen 4th Generation wRfx: NONREACTIVE

## 2021-09-15 LAB — PROCALCITONIN: Procalcitonin: 2.8 ng/mL

## 2021-09-15 MED ORDER — PERFLUTREN LIPID MICROSPHERE
1.0000 mL | INTRAVENOUS | Status: AC | PRN
  Administered 2021-09-15: 3 mL via INTRAVENOUS

## 2021-09-15 NOTE — Progress Notes (Signed)
PROGRESS NOTE    Joan [name]  YOK:[id number] DOB: 11-01-1950 DOA: 09/14/2021 PCP: Tracie Harrier, MD    Brief Narrative:  71 y.o. female with medical history significant for peripheral arterial disease status post peripheral stent angioplasty, history of coronary artery disease, history of breast cancer status postlumpectomy and adjuvant chemotherapy, history of GERD who presents to the ER via EMS for evaluation of worsening shortness of breath. Patient states that she has had symptoms for several weeks but worse the night prior to her admission.  Shortness of breath is not related to rest or exertion and is associated with a cough productive of clear phlegm as well as chills she denies having any fever.  She complains of a sinus headache and has had some postnasal drainage.  She also has had nausea and vomiting.  Per EMS she had room air pulse oximetry in the high 80s and was placed on 3 L of oxygen with improvement in her pulse oximetry.  Labs show a white count of 21.9, lactic acid level of 2.6 and troponin of 486 Chest x-ray shows possible infiltrate in the left lung.  No chest pain.  Downtrending troponins.  Cardiology consult deferred for now.  Downtrending white count.   Assessment & Plan:   Principal Problem:   Sepsis ([place]) Active Problems:   Community acquired pneumonia   Elevated troponin   Anxiety and depression   Sepsis due to pneumonia ([place])  Severe sepsis secondary to community-acquired pneumonia Acute hypoxic respiratory failure secondary to above Sepsis physiology met with the fever, tachycardia, leukocytosis, lactic acidosis Imaging with concern for left lower lobe pneumonia Hypoxia with pulse oximetry of lower than 88% on room air Currently on 2 L supplemental Lactic acid cleared Plan: Continue Rocephin Continue azithromycin New IV fluids Follow-up blood cultures, no growth to date Monitor vitals and fever curve  Elevated troponin History of  PAD Essential hypertension Patient known to Dr. Clayborn Bigness and had a recent normal Myoview Troponin peaked at 500, now downtrending No chest pain, no suspicion for ACS Plan: Follow-up 2D echocardiogram Continue DAPT with aspirin and Plavix Normotensive blood pressure goal, continue home losartan Cardiology consult deferred Telemetry monitoring x24 hours  Anxiety Depression Continue PTA Xanax and sertraline     DVT prophylaxis: SQ Lovenox Code Status: Full Family Communication: None today.  Offered to call but patient declined Disposition Plan: Status is: Inpatient Remains inpatient appropriate because: Severe sepsis on IV antibiotics   Level of care: Telemetry Medical  Consultants:  None  Procedures:  None  Antimicrobials: Ceftriaxone Azithromycin   Subjective: Patient seen and examined.  Endorses fatigue.  No pain complaints  Objective: Vitals:   09/15/21 0441 09/15/21 0500 09/15/21 0600 09/15/21 0900  BP:  (!) 147/76 (!) 167/140 125/62  Pulse: 100 85 91 91  Resp: (!) 22 (!) 21 (!) 21 (!) 22  Temp: 98.1 F (36.7 C)   98.2 F (36.8 C)  TempSrc: Oral   Oral  SpO2: 93% 95% 94% 95%  Weight:      Height:        Intake/Output Summary (Last 24 hours) at 09/15/2021 1201 Last data filed at 09/15/2021 0656 Gross per 24 hour  Intake 1768.73 ml  Output --  Net 1768.73 ml   Filed Weights   09/14/21 1831  Weight: 84.8 kg    Examination:  General exam: NAD.  Fatigued Respiratory system: Left lung crackles.  Normal work of breathing.  2 L. Cardiovascular system: S1-S2, RRR, no murmurs, no pedal edema  Gastrointestinal system: Soft, NT/ND, normal bowel sounds Central nervous system: Alert and oriented. No focal neurological deficits. Extremities: Symmetric 5 x 5 power. Skin: No rashes, lesions or ulcers Psychiatry: Judgement and insight appear normal. Mood & affect appropriate.     Data Reviewed: I have personally reviewed following labs and imaging  studies  CBC: Recent Labs  Lab 09/14/21 1317 09/15/21 0644  WBC 21.9* 17.0*  NEUTROABS 18.8*  --   HGB 13.1 11.5*  HCT 41.4 36.2  MCV 90.0 88.3  PLT 281 696   Basic Metabolic Panel: Recent Labs  Lab 09/14/21 1317 09/15/21 0644  NA 137 140  K 4.1 3.8  CL 105 109  CO2 22 25  GLUCOSE 133* 107*  BUN 13 13  CREATININE 0.74 0.64  CALCIUM 8.9 8.8*   GFR: Estimated Creatinine Clearance: 69.9 mL/min (by C-G formula based on SCr of 0.64 mg/dL). Liver Function Tests: Recent Labs  Lab 09/14/21 1317  AST 44*  ALT 43  ALKPHOS 80  BILITOT 0.6  PROT 7.5  ALBUMIN 4.0   No results for input(s): "LIPASE", "AMYLASE" in the last 168 hours. No results for input(s): "AMMONIA" in the last 168 hours. Coagulation Profile: Recent Labs  Lab 09/15/21 0644  INR 1.2   Cardiac Enzymes: No results for input(s): "CKTOTAL", "CKMB", "CKMBINDEX", "TROPONINI" in the last 168 hours. BNP (last 3 results) No results for input(s): "PROBNP" in the last 8760 hours. HbA1C: No results for input(s): "HGBA1C" in the last 72 hours. CBG: No results for input(s): "GLUCAP" in the last 168 hours. Lipid Profile: No results for input(s): "CHOL", "HDL", "LDLCALC", "TRIG", "CHOLHDL", "LDLDIRECT" in the last 72 hours. Thyroid Function Tests: No results for input(s): "TSH", "T4TOTAL", "FREET4", "T3FREE", "THYROIDAB" in the last 72 hours. Anemia Panel: No results for input(s): "VITAMINB12", "FOLATE", "FERRITIN", "TIBC", "IRON", "RETICCTPCT" in the last 72 hours. Sepsis Labs: Recent Labs  Lab 09/14/21 1318 09/14/21 1630 09/14/21 2116 09/15/21 0644  PROCALCITON  --   --   --  2.80  LATICACIDVEN 2.6* 2.6* 1.4  --     Recent Results (from the past 240 hour(s))  Resp Panel by RT-PCR (Flu A&B, Covid) Anterior Nasal Swab     Status: None   Collection Time: 09/14/21  1:18 PM   Specimen: Anterior Nasal Swab  Result Value Ref Range Status   SARS Coronavirus 2 by RT PCR NEGATIVE NEGATIVE Final    Comment:  (NOTE) SARS-CoV-2 target nucleic acids are NOT DETECTED.  The SARS-CoV-2 RNA is generally detectable in upper respiratory specimens during the acute phase of infection. The lowest concentration of SARS-CoV-2 viral copies this assay can detect is 138 copies/mL. A negative result does not preclude SARS-Cov-2 infection and should not be used as the sole basis for treatment or other patient management decisions. A negative result may occur with  improper specimen collection/handling, submission of specimen other than nasopharyngeal swab, presence of viral mutation(s) within the areas targeted by this assay, and inadequate number of viral copies(<138 copies/mL). A negative result must be combined with clinical observations, patient history, and epidemiological information. The expected result is Negative.  Fact Sheet for Patients:  EntrepreneurPulse.com.au  Fact Sheet for Healthcare Providers:  IncredibleEmployment.be  This test is no t yet approved or cleared by the [place] FDA and  has been authorized for detection and/or diagnosis of SARS-CoV-2 by FDA under an Emergency Use Authorization (EUA). This EUA will remain  in effect (meaning this test can be used) for the duration of the COVID-19  declaration under Section 564(b)(1) of the Act, 21 U.S.C.section 360bbb-3(b)(1), unless the authorization is terminated  or revoked sooner.       Influenza A by PCR NEGATIVE NEGATIVE Final   Influenza B by PCR NEGATIVE NEGATIVE Final    Comment: (NOTE) The Xpert Xpress SARS-CoV-2/FLU/RSV plus assay is intended as an aid in the diagnosis of influenza from Nasopharyngeal swab specimens and should not be used as a sole basis for treatment. Nasal washings and aspirates are unacceptable for Xpert Xpress SARS-CoV-2/FLU/RSV testing.  Fact Sheet for Patients: EntrepreneurPulse.com.au  Fact Sheet for Healthcare  Providers: IncredibleEmployment.be  This test is not yet approved or cleared by the [place] FDA and has been authorized for detection and/or diagnosis of SARS-CoV-2 by FDA under an Emergency Use Authorization (EUA). This EUA will remain in effect (meaning this test can be used) for the duration of the COVID-19 declaration under Section 564(b)(1) of the Act, 21 U.S.C. section 360bbb-3(b)(1), unless the authorization is terminated or revoked.  Performed at [facility], [address] [place], [place] [address]   Culture, blood (routine x 2)     Status: None (Preliminary result)   Collection Time: 09/14/21  1:32 PM   Specimen: BLOOD RIGHT FOREARM  Result Value Ref Range Status   Specimen Description BLOOD RIGHT FOREARM  Final   Special Requests   Final    BOTTLES DRAWN AEROBIC AND ANAEROBIC Blood Culture adequate volume   Culture   Final    NO GROWTH < 24 HOURS Performed at [facility], [address] [place], [place] [address]    Report Status PENDING  Incomplete  Culture, blood (routine x 2)     Status: None (Preliminary result)   Collection Time: 09/14/21  4:58 PM   Specimen: BLOOD LEFT HAND  Result Value Ref Range Status   Specimen Description BLOOD LEFT HAND  Final   Special Requests   Final    BOTTLES DRAWN AEROBIC AND ANAEROBIC Blood Culture adequate volume   Culture   Final    NO GROWTH < 24 HOURS Performed at [facility], [address], [place], [place] [address]    Report Status PENDING  Incomplete         Radiology Studies: DG Chest 2 View  Result Date: 09/14/2021 CLINICAL DATA:  Short of breath.  Cough and fever EXAM: CHEST - 2 VIEW COMPARISON:  Chest 07/15/2013 FINDINGS: Heart size and vascularity normal. Right lung is clear Mild airspace disease left lung base on the frontal view, difficult to confirm on the lateral view. Possible pneumonia. No pleural effusion. IMPRESSION: Possible infiltrate in the  left lung base. Electronically Signed   By: Franchot Gallo M.D.   On: 09/14/2021 14:11        Scheduled Meds:  aspirin EC  81 mg Oral Daily   clopidogrel  75 mg Oral Daily   enoxaparin (LOVENOX) injection  40 mg Subcutaneous Q24H   losartan  50 mg Oral Daily   pantoprazole  40 mg Oral Daily   sertraline  100 mg Oral Daily   Continuous Infusions:  azithromycin Stopped (09/14/21 1940)   cefTRIAXone (ROCEPHIN)  IV Stopped (09/14/21 1713)   lactated ringers 125 mL/hr at 09/15/21 0230     LOS: 1 day     Sidney Ace, MD [facility]   If 7PM-7AM, please contact night-coverage  09/15/2021, 12:01 PM

## 2021-09-15 NOTE — Progress Notes (Signed)
*  PRELIMINARY RESULTS* Echocardiogram 2D Echocardiogram has been performed.  Joan [name] [name] 09/15/2021, 1:39 PM

## 2021-09-16 ENCOUNTER — Inpatient Hospital Stay: Payer: Medicare HMO

## 2021-09-16 DIAGNOSIS — A419 Sepsis, unspecified organism: Secondary | ICD-10-CM | POA: Diagnosis not present

## 2021-09-16 LAB — BASIC METABOLIC PANEL
Anion gap: 7 (ref 5–15)
BUN: 9 mg/dL (ref 8–23)
CO2: 26 mmol/L (ref 22–32)
Calcium: 8.7 mg/dL — ABNORMAL LOW (ref 8.9–10.3)
Chloride: 103 mmol/L (ref 98–111)
Creatinine, Ser: 0.52 mg/dL (ref 0.44–1.00)
GFR, Estimated: >60 mL/min (ref >60)
Glucose, Bld: 127 mg/dL — ABNORMAL HIGH (ref 70–99)
Potassium: 3.8 mmol/L (ref 3.5–5.1)
Sodium: 136 mmol/L (ref 135–145)

## 2021-09-16 LAB — CBC WITH DIFFERENTIAL/PLATELET
Abs Immature Granulocytes: 0.04 10*3/uL (ref 0.00–0.07)
Basophils Absolute: 0 10*3/uL (ref 0.0–0.1)
Basophils Relative: 0 %
Eosinophils Absolute: 0.4 10*3/uL (ref 0.0–0.5)
Eosinophils Relative: 3 %
HCT: 36.4 % (ref 36.0–46.0)
Hemoglobin: 11.6 g/dL — ABNORMAL LOW (ref 12.0–15.0)
Immature Granulocytes: 0 %
Lymphocytes Relative: 19 %
Lymphs Abs: 2.3 10*3/uL (ref 0.7–4.0)
MCH: 28.6 pg (ref 26.0–34.0)
MCHC: 31.9 g/dL (ref 30.0–36.0)
MCV: 89.9 fL (ref 80.0–100.0)
Monocytes Absolute: 0.7 10*3/uL (ref 0.1–1.0)
Monocytes Relative: 5 %
Neutro Abs: 8.7 10*3/uL — ABNORMAL HIGH (ref 1.7–7.7)
Neutrophils Relative %: 73 %
Platelets: 247 10*3/uL (ref 150–400)
RBC: 4.05 MIL/uL (ref 3.87–5.11)
RDW: 14 % (ref 11.5–15.5)
WBC: 12 10*3/uL — ABNORMAL HIGH (ref 4.0–10.5)
nRBC: 0 % (ref 0.0–0.2)

## 2021-09-16 MED ORDER — ORAL CARE MOUTH RINSE: 15.0000 mL | OROMUCOSAL | Status: DC | PRN

## 2021-09-16 MED ORDER — IOHEXOL 350 MG/ML SOLN
75.0000 mL | Freq: Once | INTRAVENOUS | Status: AC | PRN
  Administered 2021-09-16: 75 mL via INTRAVENOUS

## 2021-09-16 MED ORDER — IPRATROPIUM-ALBUTEROL 0.5-2.5 (3) MG/3ML IN SOLN
3.0000 mL | Freq: Four times a day (QID) | RESPIRATORY_TRACT | Status: DC
  Administered 2021-09-16 – 2021-09-18 (×11): 3 mL via RESPIRATORY_TRACT
  Filled 2021-09-16 (×11): qty 3

## 2021-09-16 MED ORDER — AZITHROMYCIN 250 MG PO TABS
500.0000 mg | ORAL_TABLET | Freq: Every day | ORAL | Status: AC
  Administered 2021-09-16 – 2021-09-18 (×3): 500 mg via ORAL
  Filled 2021-09-16 (×3): qty 2

## 2021-09-16 NOTE — Progress Notes (Signed)
PHARMACIST - PHYSICIAN COMMUNICATION  CONCERNING: Antibiotic IV to Oral Route Change Policy  RECOMMENDATION: This patient is receiving azithromycin by the intravenous route.  Based on criteria approved by the Pharmacy and Therapeutics Committee, the antibiotic(s) is/are being converted to the equivalent oral dose form(s).   DESCRIPTION: These criteria include: Patient being treated for a respiratory tract infection, urinary tract infection, cellulitis or clostridium difficile associated diarrhea if on metronidazole The patient is not neutropenic and does not exhibit a GI malabsorption state The patient is eating (either orally or via tube) and/or has been taking other orally administered medications for a least 24 hours The patient is improving clinically and has a Tmax < 100.5  If you have questions about this conversion, please contact the [place]  09/16/21

## 2021-09-16 NOTE — Progress Notes (Signed)
PROGRESS NOTE    Joan [name]  NLG:[id number] DOB: 14-Jun-1950 DOA: 09/14/2021 PCP: Tracie Harrier, MD    Brief Narrative:  71 y.o. female with medical history significant for peripheral arterial disease status post peripheral stent angioplasty, history of coronary artery disease, history of breast cancer status postlumpectomy and adjuvant chemotherapy, history of GERD who presents to the ER via EMS for evaluation of worsening shortness of breath. Patient states that she has had symptoms for several weeks but worse the night prior to her admission.  Shortness of breath is not related to rest or exertion and is associated with a cough productive of clear phlegm as well as chills she denies having any fever.  She complains of a sinus headache and has had some postnasal drainage.  She also has had nausea and vomiting.  Per EMS she had room air pulse oximetry in the high 80s and was placed on 3 L of oxygen with improvement in her pulse oximetry.  Labs show a white count of 21.9, lactic acid level of 2.6 and troponin of 486 Chest x-ray shows possible infiltrate in the left lung.  No chest pain.  Downtrending troponins.  Cardiology consult deferred for now.  Downtrending white count.   Assessment & Plan:   Principal Problem:   Sepsis ([place]) Active Problems:   Community acquired pneumonia   Elevated troponin   Anxiety and depression   Sepsis due to pneumonia ([place])  Severe sepsis secondary to community-acquired pneumonia Acute hypoxic respiratory failure secondary to above Sepsis physiology met with the fever, tachycardia, leukocytosis, lactic acidosis Imaging with concern for left lower lobe pneumonia Hypoxia with pulse oximetry of lower than 88% on room air Currently on 2 L supplemental oxygen Lactic acid cleared Plan: Continue Rocephin Continue azithromycin No IVF Check CT thorax with contrast Follow-up blood cultures, no growth to date Monitor vitals and fever curve  Elevated  troponin History of PAD Essential hypertension Patient known to Dr. Clayborn Bigness and had a recent normal Myoview Troponin peaked at 500, now downtrending No chest pain, no suspicion for ACS Plan: Follow-up 2D echocardiogram, pending Continue DAPT with aspirin and Plavix Normotensive blood pressure goal, continue home losartan Cardiology consult deferred Discontinue telemetry  Anxiety Depression Continue PTA Xanax and sertraline     DVT prophylaxis: SQ Lovenox Code Status: Full Family Communication: None today.  Offered to call but patient declined Disposition Plan: Status is: Inpatient Remains inpatient appropriate because: Severe sepsis on IV antibiotics.  Possible discharge in 1 to 2 days   Level of care: Telemetry Medical  Consultants:  None  Procedures:  None  Antimicrobials: Ceftriaxone Azithromycin   Subjective: Seen and examined.  No clinical status changes over interval.  Objective: Vitals:   09/15/21 1938 09/16/21 0424 09/16/21 0758 09/16/21 1004  BP: (!) 153/64 (!) 163/86 (!) 148/62   Pulse: 79 89 89   Resp: '19 20 20   ' Temp: 99 F (37.2 C) 98.2 F (36.8 C) 98.2 F (36.8 C)   TempSrc: Oral Oral Oral   SpO2: 97% 98% 97% 96%  Weight:      Height:        Intake/Output Summary (Last 24 hours) at 09/16/2021 1204 Last data filed at 09/16/2021 0900 Gross per 24 hour  Intake 2160.83 ml  Output 900 ml  Net 1260.83 ml   Filed Weights   09/14/21 1831  Weight: 84.8 kg    Examination:  General exam: No acute distress.  Appears fatigued Respiratory system: Crackles on left.  Mild  end expiratory wheeze.  No work of breathing.  2 L Cardiovascular system: S1-S2, RRR, no murmurs, no pedal edema Gastrointestinal system: Soft, NT/ND, normal bowel sounds Central nervous system: Alert and oriented. No focal neurological deficits. Extremities: Symmetric 5 x 5 power. Skin: No rashes, lesions or ulcers Psychiatry: Judgement and insight appear normal. Mood &  affect appropriate.     Data Reviewed: I have personally reviewed following labs and imaging studies  CBC: Recent Labs  Lab 09/14/21 1317 09/15/21 0644 09/16/21 0915  WBC 21.9* 17.0* 12.0*  NEUTROABS 18.8*  --  8.7*  HGB 13.1 11.5* 11.6*  HCT 41.4 36.2 36.4  MCV 90.0 88.3 89.9  PLT 281 246 121   Basic Metabolic Panel: Recent Labs  Lab 09/14/21 1317 09/15/21 0644 09/16/21 0915  NA 137 140 136  K 4.1 3.8 3.8  CL 105 109 103  CO2 '22 25 26  ' GLUCOSE 133* 107* 127*  BUN '13 13 9  ' CREATININE 0.74 0.64 0.52  CALCIUM 8.9 8.8* 8.7*   GFR: Estimated Creatinine Clearance: 69.9 mL/min (by C-G formula based on SCr of 0.52 mg/dL). Liver Function Tests: Recent Labs  Lab 09/14/21 1317  AST 44*  ALT 43  ALKPHOS 80  BILITOT 0.6  PROT 7.5  ALBUMIN 4.0   No results for input(s): "LIPASE", "AMYLASE" in the last 168 hours. No results for input(s): "AMMONIA" in the last 168 hours. Coagulation Profile: Recent Labs  Lab 09/15/21 0644  INR 1.2   Cardiac Enzymes: No results for input(s): "CKTOTAL", "CKMB", "CKMBINDEX", "TROPONINI" in the last 168 hours. BNP (last 3 results) No results for input(s): "PROBNP" in the last 8760 hours. HbA1C: No results for input(s): "HGBA1C" in the last 72 hours. CBG: No results for input(s): "GLUCAP" in the last 168 hours. Lipid Profile: No results for input(s): "CHOL", "HDL", "LDLCALC", "TRIG", "CHOLHDL", "LDLDIRECT" in the last 72 hours. Thyroid Function Tests: No results for input(s): "TSH", "T4TOTAL", "FREET4", "T3FREE", "THYROIDAB" in the last 72 hours. Anemia Panel: No results for input(s): "VITAMINB12", "FOLATE", "FERRITIN", "TIBC", "IRON", "RETICCTPCT" in the last 72 hours. Sepsis Labs: Recent Labs  Lab 09/14/21 1318 09/14/21 1630 09/14/21 2116 09/15/21 0644  PROCALCITON  --   --   --  2.80  LATICACIDVEN 2.6* 2.6* 1.4  --     Recent Results (from the past 240 hour(s))  Resp Panel by RT-PCR (Flu A&B, Covid) Anterior Nasal Swab      Status: None   Collection Time: 09/14/21  1:18 PM   Specimen: Anterior Nasal Swab  Result Value Ref Range Status   SARS Coronavirus 2 by RT PCR NEGATIVE NEGATIVE Final    Comment: (NOTE) SARS-CoV-2 target nucleic acids are NOT DETECTED.  The SARS-CoV-2 RNA is generally detectable in upper respiratory specimens during the acute phase of infection. The lowest concentration of SARS-CoV-2 viral copies this assay can detect is 138 copies/mL. A negative result does not preclude SARS-Cov-2 infection and should not be used as the sole basis for treatment or other patient management decisions. A negative result may occur with  improper specimen collection/handling, submission of specimen other than nasopharyngeal swab, presence of viral mutation(s) within the areas targeted by this assay, and inadequate number of viral copies(<138 copies/mL). A negative result must be combined with clinical observations, patient history, and epidemiological information. The expected result is Negative.  Fact Sheet for Patients:  EntrepreneurPulse.com.au  Fact Sheet for Healthcare Providers:  IncredibleEmployment.be  This test is no t yet approved or cleared by the [place] FDA  and  has been authorized for detection and/or diagnosis of SARS-CoV-2 by FDA under an Emergency Use Authorization (EUA). This EUA will remain  in effect (meaning this test can be used) for the duration of the COVID-19 declaration under Section 564(b)(1) of the Act, 21 U.S.C.section 360bbb-3(b)(1), unless the authorization is terminated  or revoked sooner.       Influenza A by PCR NEGATIVE NEGATIVE Final   Influenza B by PCR NEGATIVE NEGATIVE Final    Comment: (NOTE) The Xpert Xpress SARS-CoV-2/FLU/RSV plus assay is intended as an aid in the diagnosis of influenza from Nasopharyngeal swab specimens and should not be used as a sole basis for treatment. Nasal washings and aspirates are  unacceptable for Xpert Xpress SARS-CoV-2/FLU/RSV testing.  Fact Sheet for Patients: EntrepreneurPulse.com.au  Fact Sheet for Healthcare Providers: IncredibleEmployment.be  This test is not yet approved or cleared by the [place] FDA and has been authorized for detection and/or diagnosis of SARS-CoV-2 by FDA under an Emergency Use Authorization (EUA). This EUA will remain in effect (meaning this test can be used) for the duration of the COVID-19 declaration under Section 564(b)(1) of the Act, 21 U.S.C. section 360bbb-3(b)(1), unless the authorization is terminated or revoked.  Performed at [facility], [address] [place], [place] [address]   Culture, blood (routine x 2)     Status: None (Preliminary result)   Collection Time: 09/14/21  1:32 PM   Specimen: BLOOD RIGHT FOREARM  Result Value Ref Range Status   Specimen Description BLOOD RIGHT FOREARM  Final   Special Requests   Final    BOTTLES DRAWN AEROBIC AND ANAEROBIC Blood Culture adequate volume   Culture   Final    NO GROWTH 2 DAYS Performed at [facility], [address] [place], [place] [address]    Report Status PENDING  Incomplete  Culture, blood (routine x 2)     Status: None (Preliminary result)   Collection Time: 09/14/21  4:58 PM   Specimen: BLOOD LEFT HAND  Result Value Ref Range Status   Specimen Description BLOOD LEFT HAND  Final   Special Requests   Final    BOTTLES DRAWN AEROBIC AND ANAEROBIC Blood Culture adequate volume   Culture   Final    NO GROWTH 2 DAYS Performed at [facility], [address], [place], [place] [address]    Report Status PENDING  Incomplete         Radiology Studies: ECHOCARDIOGRAM COMPLETE  Result Date: 09/15/2021    ECHOCARDIOGRAM REPORT   Patient Name:   Joan [name] Date of Exam: 09/15/2021 Medical Rec #:  [id number]      Height:       64.0 in Accession #:    [id number]     Weight:       187.0  lb Date of Birth:  Aug 09, 1950      BSA:          1.901 m Patient Age:    33 years       BP:           125/62 mmHg Patient Gender: F              HR:           78 bpm. Exam Location:  [facility] Procedure: 2D Echo, Color Doppler, Cardiac Doppler and Intracardiac            Opacification Agent Indications:     Elevated troponin  History:  Patient has no prior history of Echocardiogram examinations.                  PVD and TIA; Risk Factors:Hypertension.  Sonographer:     Charmayne Sheer Referring Phys:  [id number] Diagnosing Phys: Isaias Cowman MD  Sonographer Comments: Technically difficult study due to poor echo windows. IMPRESSIONS  1. Left ventricular ejection fraction, by estimation, is 55 to 60%. The left ventricle has normal function. The left ventricle has no regional wall motion abnormalities. Left ventricular diastolic parameters were normal.  2. Right ventricular systolic function is normal. The right ventricular size is normal.  3. The mitral valve is normal in structure. Trivial mitral valve regurgitation. No evidence of mitral stenosis.  4. The aortic valve is normal in structure. Aortic valve regurgitation is not visualized. No aortic stenosis is present.  5. The inferior vena cava is normal in size with greater than 50% respiratory variability, suggesting right atrial pressure of 3 mmHg. FINDINGS  Left Ventricle: Left ventricular ejection fraction, by estimation, is 55 to 60%. The left ventricle has normal function. The left ventricle has no regional wall motion abnormalities. Definity contrast agent was given IV to delineate the left ventricular  endocardial borders. The left ventricular internal cavity size was normal in size. There is no left ventricular hypertrophy. Left ventricular diastolic parameters were normal. Right Ventricle: The right ventricular size is normal. No increase in right ventricular wall thickness. Right ventricular systolic function is normal. Left Atrium: Left  atrial size was normal in size. Right Atrium: Right atrial size was normal in size. Pericardium: There is no evidence of pericardial effusion. Mitral Valve: The mitral valve is normal in structure. Trivial mitral valve regurgitation. No evidence of mitral valve stenosis. Tricuspid Valve: The tricuspid valve is normal in structure. Tricuspid valve regurgitation is trivial. No evidence of tricuspid stenosis. Aortic Valve: The aortic valve is normal in structure. Aortic valve regurgitation is not visualized. No aortic stenosis is present. Aortic valve mean gradient measures 5.0 mmHg. Aortic valve peak gradient measures 9.9 mmHg. Aortic valve area, by VTI measures 1.58 cm. Pulmonic Valve: The pulmonic valve was normal in structure. Pulmonic valve regurgitation is not visualized. No evidence of pulmonic stenosis. Aorta: The aortic root is normal in size and structure. Venous: The inferior vena cava is normal in size with greater than 50% respiratory variability, suggesting right atrial pressure of 3 mmHg. IAS/Shunts: No atrial level shunt detected by color flow Doppler.  LEFT VENTRICLE PLAX 2D LVIDd:         4.27 cm   Diastology LVIDs:         3.00 cm   LV e' medial:    8.38 cm/s LV PW:         1.03 cm   LV E/e' medial:  11.9 LV IVS:        0.80 cm   LV e' lateral:   11.20 cm/s LVOT diam:     1.70 cm   LV E/e' lateral: 8.9 LV SV:         49 LV SV Index:   26 LVOT Area:     2.27 cm  LEFT ATRIUM             Index LA diam:        3.50 cm 1.84 cm/m LA Vol (A2C):   35.0 ml 18.41 ml/m LA Vol (A4C):   34.7 ml 18.25 ml/m LA Biplane Vol: 36.7 ml 19.30 ml/m  AORTIC VALVE  PULMONIC VALVE AV Area (Vmax):    1.68 cm      PV Vmax:       1.11 m/s AV Area (Vmean):   1.69 cm      PV Peak grad:  4.9 mmHg AV Area (VTI):     1.58 cm AV Vmax:           157.00 cm/s AV Vmean:          106.000 cm/s AV VTI:            0.308 m AV Peak Grad:      9.9 mmHg AV Mean Grad:      5.0 mmHg LVOT Vmax:         116.00 cm/s LVOT  Vmean:        79.000 cm/s LVOT VTI:          0.215 m LVOT/AV VTI ratio: 0.70  AORTA Ao Root diam: 2.80 cm MITRAL VALVE MV Area (PHT): 3.63 cm     SHUNTS MV Decel Time: 209 msec     Systemic VTI:  0.22 m MV E velocity: 100.00 cm/s  Systemic Diam: 1.70 cm MV A velocity: 76.70 cm/s MV E/A ratio:  1.30 Isaias Cowman MD Electronically signed by Isaias Cowman MD Signature Date/Time: 09/15/2021/3:30:15 PM    Final    DG Chest 2 View  Result Date: 09/14/2021 CLINICAL DATA:  Short of breath.  Cough and fever EXAM: CHEST - 2 VIEW COMPARISON:  Chest 07/15/2013 FINDINGS: Heart size and vascularity normal. Right lung is clear Mild airspace disease left lung base on the frontal view, difficult to confirm on the lateral view. Possible pneumonia. No pleural effusion. IMPRESSION: Possible infiltrate in the left lung base. Electronically Signed   By: Franchot Gallo M.D.   On: 09/14/2021 14:11        Scheduled Meds:  aspirin EC  81 mg Oral Daily   azithromycin  500 mg Oral Daily   clopidogrel  75 mg Oral Daily   enoxaparin (LOVENOX) injection  40 mg Subcutaneous Q24H   ipratropium-albuterol  3 mL Nebulization Q6H   losartan  50 mg Oral Daily   pantoprazole  40 mg Oral Daily   sertraline  100 mg Oral Daily   Continuous Infusions:  cefTRIAXone (ROCEPHIN)  IV Stopped (09/15/21 1516)     LOS: 2 days     Sidney Ace, MD [facility]   If 7PM-7AM, please contact night-coverage  09/16/2021, 12:04 PM

## 2021-09-17 DIAGNOSIS — A419 Sepsis, unspecified organism: Secondary | ICD-10-CM | POA: Diagnosis not present

## 2021-09-17 NOTE — Progress Notes (Signed)
PROGRESS NOTE    Joan [name]  GPQ:[id number] DOB: Nov 27, 1950 DOA: 09/14/2021 PCP: Tracie Harrier, MD    Brief Narrative:  71 y.o. female with medical history significant for peripheral arterial disease status post peripheral stent angioplasty, history of coronary artery disease, history of breast cancer status postlumpectomy and adjuvant chemotherapy, history of GERD who presents to the ER via EMS for evaluation of worsening shortness of breath. Patient states that she has had symptoms for several weeks but worse the night prior to her admission.  Shortness of breath is not related to rest or exertion and is associated with a cough productive of clear phlegm as well as chills she denies having any fever.  She complains of a sinus headache and has had some postnasal drainage.  She also has had nausea and vomiting.  Per EMS she had room air pulse oximetry in the high 80s and was placed on 3 L of oxygen with improvement in her pulse oximetry.  Labs show a white count of 21.9, lactic acid level of 2.6 and troponin of 486 Chest x-ray shows possible infiltrate in the left lung.  No chest pain.  Downtrending troponins.  Cardiology consult deferred for now.    No fevers noted.  Patient still experiencing respiratory distress and increased work of breathing but improving   Assessment & Plan:   Principal Problem:   Sepsis ([place]) Active Problems:   Community acquired pneumonia   Elevated troponin   Anxiety and depression   Sepsis due to pneumonia ([place])  Severe sepsis secondary to community-acquired pneumonia Acute hypoxic respiratory failure secondary to above Sepsis physiology met with the fever, tachycardia, leukocytosis, lactic acidosis Imaging with concern for left lower lobe pneumonia Hypoxia with pulse oximetry of lower than 88% on room air Currently on 2 L supplemental oxygen Lactic acid cleared CT chest with pneumonia, no PE Plan: Continue Rocephin Continue  azithromycin Possible transition to oral tomorrow Follow-up blood cultures, no growth to date Monitor vitals and fever curve  Elevated troponin History of PAD Essential hypertension Patient known to Dr. Clayborn Bigness and had a recent normal Myoview Troponin peaked at 500, now downtrending No chest pain, no suspicion for ACS Echocardiogram normal Plan: Continue DAPT with aspirin and Plavix Normotensive blood pressure goal, continue home losartan Cardiology consult deferred Discontinue telemetry  Anxiety Depression Continue PTA Xanax and sertraline     DVT prophylaxis: SQ Lovenox Code Status: Full Family Communication: None today.  Offered to call but patient declined Disposition Plan: Status is: Inpatient Remains inpatient appropriate because: Severe sepsis on IV antibiotics.  Anticipated discharge 8/28   Level of care: Telemetry Medical  Consultants:  None  Procedures:  None  Antimicrobials: Ceftriaxone Azithromycin   Subjective: Seen examined.  Weaned off supplemental oxygen.  Improving respiratory status.  Objective: Vitals:   09/17/21 0439 09/17/21 0713 09/17/21 0856 09/17/21 1338  BP: (!) 125/52  122/66   Pulse: 82  99   Resp: 20  16   Temp: 98 F (36.7 C)  98.7 F (37.1 C)   TempSrc: Oral  Oral   SpO2: 94% 97% (!) 80% 90%  Weight:      Height:        Intake/Output Summary (Last 24 hours) at 09/17/2021 1452 Last data filed at 09/17/2021 1412 Gross per 24 hour  Intake 820 ml  Output 800 ml  Net 20 ml   Filed Weights   09/14/21 1831  Weight: 84.8 kg    Examination:  General exam: In NAD Respiratory  system: Left-sided crackles.  No wheeze.  Normal work of breathing.  Room air Cardiovascular system: S1-S2, RRR, no murmurs, no pedal edema Gastrointestinal system: Soft, NT/ND, normal bowel sounds Central nervous system: Alert and oriented. No focal neurological deficits. Extremities: Symmetric 5 x 5 power. Skin: No rashes, lesions or  ulcers Psychiatry: Judgement and insight appear normal. Mood & affect appropriate.     Data Reviewed: I have personally reviewed following labs and imaging studies  CBC: Recent Labs  Lab 09/14/21 1317 09/15/21 0644 09/16/21 0915  WBC 21.9* 17.0* 12.0*  NEUTROABS 18.8*  --  8.7*  HGB 13.1 11.5* 11.6*  HCT 41.4 36.2 36.4  MCV 90.0 88.3 89.9  PLT 281 246 637   Basic Metabolic Panel: Recent Labs  Lab 09/14/21 1317 09/15/21 0644 09/16/21 0915  NA 137 140 136  K 4.1 3.8 3.8  CL 105 109 103  CO2 '22 25 26  ' GLUCOSE 133* 107* 127*  BUN '13 13 9  ' CREATININE 0.74 0.64 0.52  CALCIUM 8.9 8.8* 8.7*   GFR: Estimated Creatinine Clearance: 69.9 mL/min (by C-G formula based on SCr of 0.52 mg/dL). Liver Function Tests: Recent Labs  Lab 09/14/21 1317  AST 44*  ALT 43  ALKPHOS 80  BILITOT 0.6  PROT 7.5  ALBUMIN 4.0   No results for input(s): "LIPASE", "AMYLASE" in the last 168 hours. No results for input(s): "AMMONIA" in the last 168 hours. Coagulation Profile: Recent Labs  Lab 09/15/21 0644  INR 1.2   Cardiac Enzymes: No results for input(s): "CKTOTAL", "CKMB", "CKMBINDEX", "TROPONINI" in the last 168 hours. BNP (last 3 results) No results for input(s): "PROBNP" in the last 8760 hours. HbA1C: No results for input(s): "HGBA1C" in the last 72 hours. CBG: No results for input(s): "GLUCAP" in the last 168 hours. Lipid Profile: No results for input(s): "CHOL", "HDL", "LDLCALC", "TRIG", "CHOLHDL", "LDLDIRECT" in the last 72 hours. Thyroid Function Tests: No results for input(s): "TSH", "T4TOTAL", "FREET4", "T3FREE", "THYROIDAB" in the last 72 hours. Anemia Panel: No results for input(s): "VITAMINB12", "FOLATE", "FERRITIN", "TIBC", "IRON", "RETICCTPCT" in the last 72 hours. Sepsis Labs: Recent Labs  Lab 09/14/21 1318 09/14/21 1630 09/14/21 2116 09/15/21 0644  PROCALCITON  --   --   --  2.80  LATICACIDVEN 2.6* 2.6* 1.4  --     Recent Results (from the past 240  hour(s))  Resp Panel by RT-PCR (Flu A&B, Covid) Anterior Nasal Swab     Status: None   Collection Time: 09/14/21  1:18 PM   Specimen: Anterior Nasal Swab  Result Value Ref Range Status   SARS Coronavirus 2 by RT PCR NEGATIVE NEGATIVE Final    Comment: (NOTE) SARS-CoV-2 target nucleic acids are NOT DETECTED.  The SARS-CoV-2 RNA is generally detectable in upper respiratory specimens during the acute phase of infection. The lowest concentration of SARS-CoV-2 viral copies this assay can detect is 138 copies/mL. A negative result does not preclude SARS-Cov-2 infection and should not be used as the sole basis for treatment or other patient management decisions. A negative result may occur with  improper specimen collection/handling, submission of specimen other than nasopharyngeal swab, presence of viral mutation(s) within the areas targeted by this assay, and inadequate number of viral copies(<138 copies/mL). A negative result must be combined with clinical observations, patient history, and epidemiological information. The expected result is Negative.  Fact Sheet for Patients:  EntrepreneurPulse.com.au  Fact Sheet for Healthcare Providers:  IncredibleEmployment.be  This test is no t yet approved or cleared by the  [place] Architectural [employer] and  has been authorized for detection and/or diagnosis of SARS-CoV-2 by FDA under an Print production planner (EUA). This EUA will remain  in effect (meaning this test can be used) for the duration of the COVID-19 declaration under Section 564(b)(1) of the Act, 21 U.S.C.section 360bbb-3(b)(1), unless the authorization is terminated  or revoked sooner.       Influenza A by PCR NEGATIVE NEGATIVE Final   Influenza B by PCR NEGATIVE NEGATIVE Final    Comment: (NOTE) The Xpert Xpress SARS-CoV-2/FLU/RSV plus assay is intended as an aid in the diagnosis of influenza from Nasopharyngeal swab specimens and should not be  used as a sole basis for treatment. Nasal washings and aspirates are unacceptable for Xpert Xpress SARS-CoV-2/FLU/RSV testing.  Fact Sheet for Patients: EntrepreneurPulse.com.au  Fact Sheet for Healthcare Providers: IncredibleEmployment.be  This test is not yet approved or cleared by the [place] FDA and has been authorized for detection and/or diagnosis of SARS-CoV-2 by FDA under an Emergency Use Authorization (EUA). This EUA will remain in effect (meaning this test can be used) for the duration of the COVID-19 declaration under Section 564(b)(1) of the Act, 21 U.S.C. section 360bbb-3(b)(1), unless the authorization is terminated or revoked.  Performed at [facility], [address] [place], [place] [address]   Culture, blood (routine x 2)     Status: None (Preliminary result)   Collection Time: 09/14/21  1:32 PM   Specimen: BLOOD RIGHT FOREARM  Result Value Ref Range Status   Specimen Description BLOOD RIGHT FOREARM  Final   Special Requests   Final    BOTTLES DRAWN AEROBIC AND ANAEROBIC Blood Culture adequate volume   Culture   Final    NO GROWTH 3 DAYS Performed at [facility], [address], [place], [place] [address]    Report Status PENDING  Incomplete  Culture, blood (routine x 2)     Status: None (Preliminary result)   Collection Time: 09/14/21  4:58 PM   Specimen: BLOOD LEFT HAND  Result Value Ref Range Status   Specimen Description BLOOD LEFT HAND  Final   Special Requests   Final    BOTTLES DRAWN AEROBIC AND ANAEROBIC Blood Culture adequate volume   Culture   Final    NO GROWTH 3 DAYS Performed at [facility], [address] [place], [place] [address]    Report Status PENDING  Incomplete         Radiology Studies: CT Angio Chest Pulmonary Embolism (PE) W or WO Contrast  Result Date: 09/16/2021 CLINICAL DATA:  Shortness of breath EXAM: CT ANGIOGRAPHY CHEST WITH CONTRAST  TECHNIQUE: Multidetector CT imaging of the chest was performed using the standard protocol during bolus administration of intravenous contrast. Multiplanar CT image reconstructions and MIPs were obtained to evaluate the vascular anatomy. RADIATION DOSE REDUCTION: This exam was performed according to the departmental dose-optimization program which includes automated exposure control, adjustment of the mA and/or kV according to patient size and/or use of iterative reconstruction technique. CONTRAST:  6m OMNIPAQUE IOHEXOL 350 MG/ML SOLN COMPARISON:  None Available. FINDINGS: Cardiovascular: Satisfactory opacification of the pulmonary arteries to the segmental level. No evidence of pulmonary embolism. Normal heart size. No pericardial effusion. Coronary atherosclerosis. Mediastinum/Nodes: Negative for adenopathy or mass. Lungs/Pleura: Patchy reticulonodular opacity in the left lung, greater in the lower lobe. Centrilobular emphysema. Upper Abdomen: Negative Musculoskeletal: No chest wall abnormality. No acute or significant osseous findings. Review of the MIP images confirms the above findings. IMPRESSION:  1. Left-sided pneumonia. 2. Negative for pulmonary embolism. 3. Coronary atherosclerosis. 4. Aortic Atherosclerosis (ICD10-I70.0) and Emphysema (ICD10-J43.9). Electronically Signed   By: Jorje Guild M.D.   On: 09/16/2021 12:04        Scheduled Meds:  aspirin EC  81 mg Oral Daily   azithromycin  500 mg Oral Daily   clopidogrel  75 mg Oral Daily   enoxaparin (LOVENOX) injection  40 mg Subcutaneous Q24H   ipratropium-albuterol  3 mL Nebulization Q6H   losartan  50 mg Oral Daily   pantoprazole  40 mg Oral Daily   sertraline  100 mg Oral Daily   Continuous Infusions:  cefTRIAXone (ROCEPHIN)  IV 2 g (09/17/21 1434)     LOS: 3 days     Sidney Ace, MD [facility]   If 7PM-7AM, please contact night-coverage  09/17/2021, 2:52 PM

## 2021-09-18 DIAGNOSIS — J441 Chronic obstructive pulmonary disease with (acute) exacerbation: Secondary | ICD-10-CM

## 2021-09-18 DIAGNOSIS — A419 Sepsis, unspecified organism: Secondary | ICD-10-CM | POA: Diagnosis not present

## 2021-09-18 LAB — CBC WITH DIFFERENTIAL/PLATELET
Abs Immature Granulocytes: 0.05 10*3/uL (ref 0.00–0.07)
Basophils Absolute: 0.1 10*3/uL (ref 0.0–0.1)
Basophils Relative: 1 %
Eosinophils Absolute: 0.5 10*3/uL (ref 0.0–0.5)
Eosinophils Relative: 5 %
HCT: 34.9 % — ABNORMAL LOW (ref 36.0–46.0)
Hemoglobin: 11.3 g/dL — ABNORMAL LOW (ref 12.0–15.0)
Immature Granulocytes: 1 %
Lymphocytes Relative: 26 %
Lymphs Abs: 2.5 10*3/uL (ref 0.7–4.0)
MCH: 28.6 pg (ref 26.0–34.0)
MCHC: 32.4 g/dL (ref 30.0–36.0)
MCV: 88.4 fL (ref 80.0–100.0)
Monocytes Absolute: 0.7 10*3/uL (ref 0.1–1.0)
Monocytes Relative: 7 %
Neutro Abs: 5.8 10*3/uL (ref 1.7–7.7)
Neutrophils Relative %: 60 %
Platelets: 285 10*3/uL (ref 150–400)
RBC: 3.95 MIL/uL (ref 3.87–5.11)
RDW: 14.1 % (ref 11.5–15.5)
WBC: 9.6 10*3/uL (ref 4.0–10.5)
nRBC: 0 % (ref 0.0–0.2)

## 2021-09-18 LAB — BASIC METABOLIC PANEL
Anion gap: 10 (ref 5–15)
BUN: 11 mg/dL (ref 8–23)
CO2: 24 mmol/L (ref 22–32)
Calcium: 8.8 mg/dL — ABNORMAL LOW (ref 8.9–10.3)
Chloride: 103 mmol/L (ref 98–111)
Creatinine, Ser: 0.69 mg/dL (ref 0.44–1.00)
GFR, Estimated: >60 mL/min (ref >60)
Glucose, Bld: 112 mg/dL — ABNORMAL HIGH (ref 70–99)
Potassium: 3.5 mmol/L (ref 3.5–5.1)
Sodium: 137 mmol/L (ref 135–145)

## 2021-09-18 MED ORDER — BUDESONIDE 0.25 MG/2ML IN SUSP
0.2500 mg | Freq: Two times a day (BID) | RESPIRATORY_TRACT | Status: DC
  Administered 2021-09-18 – 2021-09-19 (×2): 0.25 mg via RESPIRATORY_TRACT
  Filled 2021-09-18 (×2): qty 2

## 2021-09-18 MED ORDER — AMOXICILLIN-POT CLAVULANATE 875-125 MG PO TABS
1.0000 | ORAL_TABLET | Freq: Two times a day (BID) | ORAL | Status: DC
  Administered 2021-09-18 – 2021-09-19 (×3): 1 via ORAL
  Filled 2021-09-18 (×3): qty 1

## 2021-09-18 MED ORDER — IPRATROPIUM-ALBUTEROL 0.5-2.5 (3) MG/3ML IN SOLN
3.0000 mL | Freq: Two times a day (BID) | RESPIRATORY_TRACT | Status: DC
  Administered 2021-09-19: 3 mL via RESPIRATORY_TRACT
  Filled 2021-09-18: qty 3

## 2021-09-18 MED ORDER — ARFORMOTEROL TARTRATE 15 MCG/2ML IN NEBU
15.0000 ug | INHALATION_SOLUTION | Freq: Two times a day (BID) | RESPIRATORY_TRACT | Status: DC
  Administered 2021-09-18 – 2021-09-19 (×2): 15 ug via RESPIRATORY_TRACT
  Filled 2021-09-18 (×2): qty 2

## 2021-09-18 MED ORDER — PREDNISONE 20 MG PO TABS
40.0000 mg | ORAL_TABLET | Freq: Every day | ORAL | Status: DC
  Administered 2021-09-18 – 2021-09-19 (×2): 40 mg via ORAL
  Filled 2021-09-18 (×2): qty 2

## 2021-09-18 NOTE — Plan of Care (Signed)
  Problem: Clinical Measurements: Goal: Respiratory complications will improve Outcome: Progressing   Problem: Elimination: Goal: Will not experience complications related to urinary retention Outcome: Progressing   Problem: Pain Managment: Goal: General experience of comfort will improve Outcome: Progressing   Problem: Safety: Goal: Ability to remain free from injury will improve Outcome: Progressing   Problem: Skin Integrity: Goal: Risk for impaired skin integrity will decrease Outcome: Progressing

## 2021-09-18 NOTE — Progress Notes (Signed)
PROGRESS NOTE    Joan [name]  WIO:[id number] DOB: [birth year] DOA: 09/14/2021 PCP: Tracie Harrier, MD    Brief Narrative:  71 y.o. female with medical history significant for peripheral arterial disease status post peripheral stent angioplasty, history of coronary artery disease, history of breast cancer status postlumpectomy and adjuvant chemotherapy, history of GERD who presents to the ER via EMS for evaluation of worsening shortness of breath. Patient states that she has had symptoms for several weeks but worse the night prior to her admission.  Shortness of breath is not related to rest or exertion and is associated with a cough productive of clear phlegm as well as chills she denies having any fever.  She complains of a sinus headache and has had some postnasal drainage.  She also has had nausea and vomiting.  Per EMS she had room air pulse oximetry in the high 80s and was placed on 3 L of oxygen with improvement in her pulse oximetry.  Labs show a white count of 21.9, lactic acid level of 2.6 and troponin of 486 Chest x-ray shows possible infiltrate in the left lung.  No chest pain.  Downtrending troponins.  Cardiology consult deferred for now.    No fevers noted.  Patient still experiencing respiratory distress and increased work of breathing but improving.  Suspect mild COPD flare.   Assessment & Plan:   Principal Problem:   Sepsis ([place]) Active Problems:   Community acquired pneumonia   Elevated troponin   Anxiety and depression   Sepsis due to pneumonia ([place])  Severe sepsis secondary to community-acquired pneumonia Acute hypoxic respiratory failure secondary to above Sepsis physiology met with the fever, tachycardia, leukocytosis, lactic acidosis Imaging with concern for left lower lobe pneumonia Hypoxia with pulse oximetry of lower than 88% on room air Currently on 2 L supplemental oxygen Lactic acid cleared CT chest with pneumonia, no PE Completed 3 day course of  azithromycin Plan: Continue Rocephin transition to oral tomorrow Follow-up blood cultures, no growth to date Monitor vitals and fever curve  Suspected mild COPD exacerbation Patient does not have an official diagnosis of COPD however clinical signs and symptoms point to mild COPD exacerbation.  Patient with tachypnea, mildly increased work of breathing.  Diffuse adventitious breath sounds. Plan: Continue scheduled DuoNebs every 6 hours Twice daily Brovana Twice daily Pulmicort Prednisone 40 mg a day x5 days   Elevated troponin History of PAD Essential hypertension Patient known to Dr. Clayborn Bigness and had a recent normal Myoview Troponin peaked at 500, now downtrending No chest pain, no suspicion for ACS Echocardiogram normal Plan: Continue DAPT with aspirin and Plavix Normotensive blood pressure goal, continue home losartan Cardiology consult deferred Discontinue telemetry  Anxiety Depression Continue PTA Xanax and sertraline     DVT prophylaxis: SQ Lovenox Code Status: Full Family Communication: None today.  Offered to call but patient declined Disposition Plan: Status is: Inpatient Remains inpatient appropriate because: Severe sepsis on IV antibiotics.  Anticipated discharge 8/29   Level of care: Telemetry Medical  Consultants:  None  Procedures:  None  Antimicrobials: Ceftriaxone Azithromycin   Subjective: Seen examined.  Weaned off supplemental oxygen.  Still with shortness of breath  Objective: Vitals:   09/18/21 0732 09/18/21 0745 09/18/21 0832 09/18/21 1407  BP:   124/66   Pulse:   90   Resp:   16   Temp:   99.4 F (37.4 C)   TempSrc:   Oral   SpO2: (!) 85% 91% 92% 93%  Weight:  Height:        Intake/Output Summary (Last 24 hours) at 09/18/2021 1426 Last data filed at 09/18/2021 1100 Gross per 24 hour  Intake 480 ml  Output --  Net 480 ml   Filed Weights   09/14/21 1831  Weight: 84.8 kg    Examination:  General exam: Appears  fatigued Respiratory system: Is on left.  Scattered end expiratory wheeze.  Normal work of breathing.  2 L Cardiovascular system: S1-S2, RRR, no murmurs, no pedal edema Gastrointestinal system: Soft, NT/ND, normal bowel sounds Central nervous system: Alert and oriented. No focal neurological deficits. Extremities: Symmetric 5 x 5 power. Skin: No rashes, lesions or ulcers Psychiatry: Judgement and insight appear normal. Mood & affect appropriate.     Data Reviewed: I have personally reviewed following labs and imaging studies  CBC: Recent Labs  Lab 09/14/21 1317 09/15/21 0644 09/16/21 0915 09/18/21 0807  WBC 21.9* 17.0* 12.0* 9.6  NEUTROABS 18.8*  --  8.7* 5.8  HGB 13.1 11.5* 11.6* 11.3*  HCT 41.4 36.2 36.4 34.9*  MCV 90.0 88.3 89.9 88.4  PLT 281 246 247 785   Basic Metabolic Panel: Recent Labs  Lab 09/14/21 1317 09/15/21 0644 09/16/21 0915 09/18/21 0807  NA 137 140 136 137  K 4.1 3.8 3.8 3.5  CL 105 109 103 103  CO2 _0 GLUCOSE 133* 107* 127* 112*  BUN _1 CREATININE 0.74 0.64 0.52 0.69  CALCIUM 8.9 8.8* 8.7* 8.8*   GFR: Estimated Creatinine Clearance: 69.9 mL/min (by C-G formula based on SCr of 0.69 mg/dL). Liver Function Tests: Recent Labs  Lab 09/14/21 1317  AST 44*  ALT 43  ALKPHOS 80  BILITOT 0.6  PROT 7.5  ALBUMIN 4.0   No results for input(s): "LIPASE", "AMYLASE" in the last 168 hours. No results for input(s): "AMMONIA" in the last 168 hours. Coagulation Profile: Recent Labs  Lab 09/15/21 0644  INR 1.2   Cardiac Enzymes: No results for input(s): "CKTOTAL", "CKMB", "CKMBINDEX", "TROPONINI" in the last 168 hours. BNP (last 3 results) No results for input(s): "PROBNP" in the last 8760 hours. HbA1C: No results for input(s): "HGBA1C" in the last 72 hours. CBG: No results for input(s): "GLUCAP" in the last 168 hours. Lipid Profile: No results for input(s): "CHOL", "HDL", "LDLCALC", "TRIG", "CHOLHDL", "LDLDIRECT" in the last 72  hours. Thyroid Function Tests: No results for input(s): "TSH", "T4TOTAL", "FREET4", "T3FREE", "THYROIDAB" in the last 72 hours. Anemia Panel: No results for input(s): "VITAMINB12", "FOLATE", "FERRITIN", "TIBC", "IRON", "RETICCTPCT" in the last 72 hours. Sepsis Labs: Recent Labs  Lab 09/14/21 1318 09/14/21 1630 09/14/21 2116 09/15/21 0644  PROCALCITON  --   --   --  2.80  LATICACIDVEN 2.6* 2.6* 1.4  --     Recent Results (from the past 240 hour(s))  Resp Panel by RT-PCR (Flu A&B, Covid) Anterior Nasal Swab     Status: None   Collection Time: 09/14/21  1:18 PM   Specimen: Anterior Nasal Swab  Result Value Ref Range Status   SARS Coronavirus 2 by RT PCR NEGATIVE NEGATIVE Final    Comment: (NOTE) SARS-CoV-2 target nucleic acids are NOT DETECTED.  The SARS-CoV-2 RNA is generally detectable in upper respiratory specimens during the acute phase of infection. The lowest concentration of SARS-CoV-2 viral copies this assay can detect is 138 copies/mL. A negative result does not preclude SARS-Cov-2 infection and should not be used as the sole basis for treatment or other patient management decisions. A  negative result may occur with  improper specimen collection/handling, submission of specimen other than nasopharyngeal swab, presence of viral mutation(s) within the areas targeted by this assay, and inadequate number of viral copies(<138 copies/mL). A negative result must be combined with clinical observations, patient history, and epidemiological information. The expected result is Negative.  Fact Sheet for Patients:  EntrepreneurPulse.com.au  Fact Sheet for Healthcare Providers:  IncredibleEmployment.be  This test is no t yet approved or cleared by the [place] FDA and  has been authorized for detection and/or diagnosis of SARS-CoV-2 by FDA under an Emergency Use Authorization (EUA). This EUA will remain  in effect (meaning this test can  be used) for the duration of the COVID-19 declaration under Section 564(b)(1) of the Act, 21 U.S.C.section 360bbb-3(b)(1), unless the authorization is terminated  or revoked sooner.       Influenza A by PCR NEGATIVE NEGATIVE Final   Influenza B by PCR NEGATIVE NEGATIVE Final    Comment: (NOTE) The Xpert Xpress SARS-CoV-2/FLU/RSV plus assay is intended as an aid in the diagnosis of influenza from Nasopharyngeal swab specimens and should not be used as a sole basis for treatment. Nasal washings and aspirates are unacceptable for Xpert Xpress SARS-CoV-2/FLU/RSV testing.  Fact Sheet for Patients: EntrepreneurPulse.com.au  Fact Sheet for Healthcare Providers: IncredibleEmployment.be  This test is not yet approved or cleared by the [place] FDA and has been authorized for detection and/or diagnosis of SARS-CoV-2 by FDA under an Emergency Use Authorization (EUA). This EUA will remain in effect (meaning this test can be used) for the duration of the COVID-19 declaration under Section 564(b)(1) of the Act, 21 U.S.C. section 360bbb-3(b)(1), unless the authorization is terminated or revoked.  Performed at [facility], [address] [place], [place] [address]   Culture, blood (routine x 2)     Status: None (Preliminary result)   Collection Time: 09/14/21  1:32 PM   Specimen: BLOOD RIGHT FOREARM  Result Value Ref Range Status   Specimen Description BLOOD RIGHT FOREARM  Final   Special Requests   Final    BOTTLES DRAWN AEROBIC AND ANAEROBIC Blood Culture adequate volume   Culture   Final    NO GROWTH 4 DAYS Performed at [facility], [address] [place], [place] [address]    Report Status PENDING  Incomplete  Culture, blood (routine x 2)     Status: None (Preliminary result)   Collection Time: 09/14/21  4:58 PM   Specimen: BLOOD LEFT HAND  Result Value Ref Range Status   Specimen Description BLOOD LEFT HAND  Final    Special Requests   Final    BOTTLES DRAWN AEROBIC AND ANAEROBIC Blood Culture adequate volume   Culture   Final    NO GROWTH 4 DAYS Performed at [facility], [address] [place],  [address]    Report Status PENDING  Incomplete         Radiology Studies: No results found.      Scheduled Meds:  amoxicillin-clavulanate  1 tablet Oral Q12H   arformoterol  15 mcg Nebulization BID   aspirin EC  81 mg Oral Daily   budesonide (PULMICORT) nebulizer solution  0.25 mg Nebulization BID   clopidogrel  75 mg Oral Daily   enoxaparin (LOVENOX) injection  40 mg Subcutaneous Q24H   ipratropium-albuterol  3 mL Nebulization Q6H   losartan  50 mg Oral Daily   pantoprazole  40 mg Oral Daily   predniSONE  40 mg Oral  Q breakfast   sertraline  100 mg Oral Daily   Continuous Infusions:     LOS: 4 days     Sidney Ace, MD [facility]   If 7PM-7AM, please contact night-coverage  09/18/2021, 2:26 PM

## 2021-09-18 NOTE — Progress Notes (Signed)
Mobility Specialist - Progress Note   09/18/21 1031  Mobility  Activity Ambulated with assistance in hallway  Level of Assistance Standby assist, set-up cues, supervision of patient - no hands on  Assistive Device None  Distance Ambulated (ft) 160 ft  Activity Response Tolerated well  $Mobility charge 1 Mobility   Pt supine in bed on 2L upon arrival. Pt STS indep. Pt ambulates 1 lap around NS SBA with no LOB but did grab for rail twice. Pt returns to bed with needs in reach.   Gretchen Short  Mobility Specialist  09/18/21 10:33 AM

## 2021-09-18 NOTE — Progress Notes (Signed)
Mobility Specialist - Progress Note   09/18/21 1326  Mobility  Activity Ambulated independently in hallway  Level of Assistance Standby assist, set-up cues, supervision of patient - no hands on  Assistive Device None  Distance Ambulated (ft) 160 ft  Activity Response Tolerated well  $Mobility charge 1 Mobility   Pt semi-supine in bed on 2L upon arrival. Pt STS and ambulates 1 lap around NS SBA. Pt returns to bed with needs in reach.   Joan [name]  Mobility Specialist  09/18/21 1:27 PM

## 2021-09-18 NOTE — Care Management Important Message (Signed)
Important Message  Patient Details  Name: MAKYNA NIEHOFF MRN: [id number] Date of Birth: 01/14/1951   Medicare Important Message Given:  Yes     Dannette Barbara 09/18/2021, 11:17 AM

## 2021-09-18 NOTE — Progress Notes (Signed)
SATURATION QUALIFICATIONS:   Patient Saturations on Room Air at Rest = 93%  Patient Saturations on [employer] while Ambulating = 91%  Ralene Muskrat, MD made aware.

## 2021-09-19 DIAGNOSIS — J189 Pneumonia, unspecified organism: Secondary | ICD-10-CM | POA: Diagnosis not present

## 2021-09-19 DIAGNOSIS — A419 Sepsis, unspecified organism: Secondary | ICD-10-CM | POA: Diagnosis not present

## 2021-09-19 LAB — CULTURE, BLOOD (ROUTINE X 2)
Culture: NO GROWTH
Culture: NO GROWTH
Special Requests: ADEQUATE
Special Requests: ADEQUATE

## 2021-09-19 MED ORDER — BENZONATATE 100 MG PO CAPS: 100.0000 mg | ORAL_CAPSULE | Freq: Three times a day (TID) | ORAL | 0 refills | Status: AC | PRN

## 2021-09-19 MED ORDER — PREDNISONE 20 MG PO TABS: 40.0000 mg | ORAL_TABLET | Freq: Every day | ORAL | 0 refills | Status: AC

## 2021-09-19 MED ORDER — ALBUTEROL SULFATE HFA 108 (90 BASE) MCG/ACT IN AERS
2.0000 | INHALATION_SPRAY | Freq: Four times a day (QID) | RESPIRATORY_TRACT | 1 refills | Status: AC | PRN
Start: 2021-09-19 — End: ?

## 2021-09-19 MED ORDER — AMOXICILLIN-POT CLAVULANATE 875-125 MG PO TABS: 1.0000 | ORAL_TABLET | Freq: Two times a day (BID) | ORAL | 0 refills | Status: AC

## 2021-09-19 NOTE — TOC CM/SW Note (Signed)
Patient has orders to discharge home today. Chart reviewed. PCP is Dr. Ginette Pitman. On room air. No wounds. No TOC needs identified. CSW signing off.  Joan [name], [place]

## 2021-09-19 NOTE — Plan of Care (Signed)

## 2021-09-19 NOTE — Progress Notes (Signed)
Pt just waking up when this RT entered the room. Pt sat was 87% which slowly increased the more she awoke. Pt sat in low-mid 90's upon my leaving room.

## 2021-09-19 NOTE — Progress Notes (Signed)
Mobility Specialist - Progress Note   09/19/21 0911  Mobility  Activity Ambulated independently in hallway;Stood at bedside;Dangled on edge of bed  Level of Assistance Independent  Assistive Device None  Distance Ambulated (ft) 160 ft  Activity Response Tolerated well  $Mobility charge 1 Mobility    Pre-mobility: 90  SpO2 During mobility: 86 SpO2 Post-mobility: 88 SPO2  Pt semi-supine in bed on RA upon arrival. Pt STS and ambulates 1 lap around NS indep. Pt's pace and balance vastly improved from previous session. Pt returns to bed with needs in reach.   Gretchen Short  Mobility Specialist  09/19/21 9:12 AM

## 2021-09-19 NOTE — Discharge Summary (Signed)
Physician Discharge Summary  Joan [name] YTK:[id number] DOB: 07-Jul-1950 DOA: 09/14/2021  PCP: Tracie Harrier, MD  Admit date: 09/14/2021 Discharge date: 09/19/2021  Admitted From: Home Disposition:  Home  Recommendations for Outpatient Follow-up:  Follow up with PCP in 1-2 weeks Consider referral to pulmonology  Home Health:No  Equipment/Devices:None   Discharge Condition:Stable  CODE STATUS:FULL  Diet recommendation: Reg  Brief/Interim Summary: 71 y.o. female with medical history significant for peripheral arterial disease status post peripheral stent angioplasty, history of coronary artery disease, history of breast cancer status postlumpectomy and adjuvant chemotherapy, history of GERD who presents to the ER via EMS for evaluation of worsening shortness of breath. Patient states that she has had symptoms for several weeks but worse the night prior to her admission.  Shortness of breath is not related to rest or exertion and is associated with a cough productive of clear phlegm as well as chills she denies having any fever.  She complains of a sinus headache and has had some postnasal drainage.  She also has had nausea and vomiting.  Per EMS she had room air pulse oximetry in the high 80s and was placed on 3 L of oxygen with improvement in her pulse oximetry.   Labs show a white count of 21.9, lactic acid level of 2.6 and troponin of 486 Chest x-ray shows possible infiltrate in the left lung.  No chest pain.  Downtrending troponins.  Cardiology consult deferred for now.     No fevers noted.  Patient still experiencing respiratory distress and increased work of breathing but improving.  Suspect mild COPD flare.  On day of discharge patient back to baseline respiratory status.  I continue to suspect mild underlying COPD.  Patient is a former smoker and quit 2 years ago.  She has diminished breath sounds on auscultation.  Oxygen saturation hovering in the low 90s.  At time of discharge  will recommend short course of prednisone 40 mg a day x5 days, albuterol MDI as needed, Tessalon Perles.  Completed 7-day course of empiric antibiotics.  Recommend patient return to primary care physician's office and request referral to pulmonology    Discharge Diagnoses:  Principal Problem:   Sepsis [facility]) Active Problems:   Community acquired pneumonia   Elevated troponin   Anxiety and depression   Sepsis due to pneumonia ([place])  Severe sepsis secondary to community-acquired pneumonia Acute hypoxic respiratory failure secondary to above Sepsis physiology met with the fever, tachycardia, leukocytosis, lactic acidosis Imaging with concern for left lower lobe pneumonia Hypoxia with pulse oximetry of lower than 88% on room air Currently on 2 L supplemental oxygen Lactic acid cleared CT chest with pneumonia, no PE Completed 3 day course of azithromycin Plan: Transition to oral Augmentin.  Complete 7-day antibiotic course.  On room air at time of discharge.  No indication for home oxygen.   Suspected mild COPD exacerbation Patient does not have an official diagnosis of COPD however clinical signs and symptoms point to mild COPD exacerbation.  Patient with tachypnea, mildly increased work of breathing.  Diffuse adventitious breath sounds. Plan: Discharge home.  Complete 5-day course of prednisone 40 mg daily.  Albuterol MDI as needed.  As needed [employer]  Outpatient follow-up with PCP.  Consider referral to outpatient pulmonology. Discharge Instructions  Discharge Instructions     Diet - low sodium heart healthy   Complete by: As directed    Increase activity slowly   Complete by: As directed  Allergies as of 09/19/2021       Reactions   Seasonal Ic [cholestatin]    Seasonal allergies        Medication List     TAKE these medications    acetaminophen 325 MG tablet Commonly known as: TYLENOL Take 1-2 tablets (325-650 mg total) by mouth every 4 (four) hours  as needed for mild pain (or temp >/= 101 F).   albuterol 108 (90 Base) MCG/ACT inhaler Commonly known as: VENTOLIN HFA Inhale 2 puffs into the lungs every 6 (six) hours as needed for wheezing or shortness of breath.   ALPRAZolam 0.5 MG tablet Commonly known as: XANAX Take 0.5 mg by mouth 2 (two) times daily as needed for anxiety.   amoxicillin-clavulanate 875-125 MG tablet Commonly known as: AUGMENTIN Take 1 tablet by mouth every 12 (twelve) hours for 3 days.   aspirin EC 81 MG tablet Take 81 mg by mouth daily.   benzonatate 100 MG capsule Commonly known as: Tessalon Perles Take 1 capsule (100 mg total) by mouth 3 (three) times daily as needed for cough.   clopidogrel 75 MG tablet Commonly known as: Plavix Take 1 tablet (75 mg total) by mouth daily.   loratadine 10 MG tablet Commonly known as: CLARITIN Take 10 mg by mouth daily as needed for allergies.   losartan 50 MG tablet Commonly known as: COZAAR Take by mouth.   pantoprazole 40 MG tablet Commonly known as: PROTONIX Take by mouth.   predniSONE 20 MG tablet Commonly known as: DELTASONE Take 2 tablets (40 mg total) by mouth daily with breakfast for 4 days. Start taking on: September 20, 2021   sertraline 100 MG tablet Commonly known as: ZOLOFT Take 100 mg by mouth daily.        Allergies  Allergen Reactions   Seasonal Ic [Cholestatin]     Seasonal allergies    Consultations: None   Procedures/Studies: CT Angio Chest Pulmonary Embolism (PE) W or WO Contrast  Result Date: 09/16/2021 CLINICAL DATA:  Shortness of breath EXAM: CT ANGIOGRAPHY CHEST WITH CONTRAST TECHNIQUE: Multidetector CT imaging of the chest was performed using the standard protocol during bolus administration of intravenous contrast. Multiplanar CT image reconstructions and MIPs were obtained to evaluate the vascular anatomy. RADIATION DOSE REDUCTION: This exam was performed according to the departmental dose-optimization program which  includes automated exposure control, adjustment of the mA and/or kV according to patient size and/or use of iterative reconstruction technique. CONTRAST:  77m OMNIPAQUE IOHEXOL 350 MG/ML SOLN COMPARISON:  None Available. FINDINGS: Cardiovascular: Satisfactory opacification of the pulmonary arteries to the segmental level. No evidence of pulmonary embolism. Normal heart size. No pericardial effusion. Coronary atherosclerosis. Mediastinum/Nodes: Negative for adenopathy or mass. Lungs/Pleura: Patchy reticulonodular opacity in the left lung, greater in the lower lobe. Centrilobular emphysema. Upper Abdomen: Negative Musculoskeletal: No chest wall abnormality. No acute or significant osseous findings. Review of the MIP images confirms the above findings. IMPRESSION: 1. Left-sided pneumonia. 2. Negative for pulmonary embolism. 3. Coronary atherosclerosis. 4. Aortic Atherosclerosis (ICD10-I70.0) and Emphysema (ICD10-J43.9). Electronically Signed   By: JJorje GuildM.D.   On: 09/16/2021 12:04   ECHOCARDIOGRAM COMPLETE  Result Date: 09/15/2021    ECHOCARDIOGRAM REPORT   Patient Name:   Joan YEATMANDate of Exam: 09/15/2021 Medical Rec #:  [id number]     Height:       64.0 in Accession #:    [id number]    Weight:       187.0 lb Date  of Birth:  July 05, 1950      BSA:          1.901 m Patient Age:    24 years       BP:           125/62 mmHg Patient Gender: F              HR:           78 bpm. Exam Location:  [facility] Procedure: 2D Echo, Color Doppler, Cardiac Doppler and Intracardiac            Opacification Agent Indications:     Elevated troponin  History:         Patient has no prior history of Echocardiogram examinations.                  PVD and TIA; Risk Factors:Hypertension.  Sonographer:     Charmayne Sheer Referring Phys:  [id number] AGBATA Diagnosing Phys: Isaias Cowman MD  Sonographer Comments: Technically difficult study due to poor echo windows. IMPRESSIONS  1. Left ventricular ejection fraction, by  estimation, is 55 to 60%. The left ventricle has normal function. The left ventricle has no regional wall motion abnormalities. Left ventricular diastolic parameters were normal.  2. Right ventricular systolic function is normal. The right ventricular size is normal.  3. The mitral valve is normal in structure. Trivial mitral valve regurgitation. No evidence of mitral stenosis.  4. The aortic valve is normal in structure. Aortic valve regurgitation is not visualized. No aortic stenosis is present.  5. The inferior vena cava is normal in size with greater than 50% respiratory variability, suggesting right atrial pressure of 3 mmHg. FINDINGS  Left Ventricle: Left ventricular ejection fraction, by estimation, is 55 to 60%. The left ventricle has normal function. The left ventricle has no regional wall motion abnormalities. Definity contrast agent was given IV to delineate the left ventricular  endocardial borders. The left ventricular internal cavity size was normal in size. There is no left ventricular hypertrophy. Left ventricular diastolic parameters were normal. Right Ventricle: The right ventricular size is normal. No increase in right ventricular wall thickness. Right ventricular systolic function is normal. Left Atrium: Left atrial size was normal in size. Right Atrium: Right atrial size was normal in size. Pericardium: There is no evidence of pericardial effusion. Mitral Valve: The mitral valve is normal in structure. Trivial mitral valve regurgitation. No evidence of mitral valve stenosis. Tricuspid Valve: The tricuspid valve is normal in structure. Tricuspid valve regurgitation is trivial. No evidence of tricuspid stenosis. Aortic Valve: The aortic valve is normal in structure. Aortic valve regurgitation is not visualized. No aortic stenosis is present. Aortic valve mean gradient measures 5.0 mmHg. Aortic valve peak gradient measures 9.9 mmHg. Aortic valve area, by VTI measures 1.58 cm. Pulmonic Valve: The  pulmonic valve was normal in structure. Pulmonic valve regurgitation is not visualized. No evidence of pulmonic stenosis. Aorta: The aortic root is normal in size and structure. Venous: The inferior vena cava is normal in size with greater than 50% respiratory variability, suggesting right atrial pressure of 3 mmHg. IAS/Shunts: No atrial level shunt detected by color flow Doppler.  LEFT VENTRICLE PLAX 2D LVIDd:         4.27 cm   Diastology LVIDs:         3.00 cm   LV e' medial:    8.38 cm/s LV PW:         1.03 cm   LV E/e'  medial:  11.9 LV IVS:        0.80 cm   LV e' lateral:   11.20 cm/s LVOT diam:     1.70 cm   LV E/e' lateral: 8.9 LV SV:         49 LV SV Index:   26 LVOT Area:     2.27 cm  LEFT ATRIUM             Index LA diam:        3.50 cm 1.84 cm/m LA Vol (A2C):   35.0 ml 18.41 ml/m LA Vol (A4C):   34.7 ml 18.25 ml/m LA Biplane Vol: 36.7 ml 19.30 ml/m  AORTIC VALVE                     PULMONIC VALVE AV Area (Vmax):    1.68 cm      PV Vmax:       1.11 m/s AV Area (Vmean):   1.69 cm      PV Peak grad:  4.9 mmHg AV Area (VTI):     1.58 cm AV Vmax:           157.00 cm/s AV Vmean:          106.000 cm/s AV VTI:            0.308 m AV Peak Grad:      9.9 mmHg AV Mean Grad:      5.0 mmHg LVOT Vmax:         116.00 cm/s LVOT Vmean:        79.000 cm/s LVOT VTI:          0.215 m LVOT/AV VTI ratio: 0.70  AORTA Ao Root diam: 2.80 cm MITRAL VALVE MV Area (PHT): 3.63 cm     SHUNTS MV Decel Time: 209 msec     Systemic VTI:  0.22 m MV E velocity: 100.00 cm/s  Systemic Diam: 1.70 cm MV A velocity: 76.70 cm/s MV E/A ratio:  1.30 Isaias Cowman MD Electronically signed by Isaias Cowman MD Signature Date/Time: 09/15/2021/3:30:15 PM    Final    DG Chest 2 View  Result Date: 09/14/2021 CLINICAL DATA:  Short of breath.  Cough and fever EXAM: CHEST - 2 VIEW COMPARISON:  Chest 07/15/2013 FINDINGS: Heart size and vascularity normal. Right lung is clear Mild airspace disease left lung base on the frontal view,  difficult to confirm on the lateral view. Possible pneumonia. No pleural effusion. IMPRESSION: Possible infiltrate in the left lung base. Electronically Signed   By: Franchot Gallo M.D.   On: 09/14/2021 14:11   VAS [place] LOWER EXTREMITY ARTERIAL DUPLEX  Result Date: 09/01/2021 LOWER EXTREMITY ARTERIAL DUPLEX STUDY Patient Name:  Joan [name]  Date of Exam:   08/29/2021 Medical Rec #: [id number]       Accession #:    [id number] Date of Birth: May 19, 1950       Patient Gender: F Patient Age:   77 years Exam Location:  [facility] Procedure:      VAS [place] LOWER EXTREMITY ARTERIAL DUPLEX Referring Phys: [name] --------------------------------------------------------------------------------  Indications: Peripheral artery disease.  Vascular Interventions: 08/15/2020 PTA of Lt SFA and popliteal artery. PTA of Lt                         distal SFA and tibioperoneal trunk. Stent Lt SFA and  popliteal. 08/22/2020 PTA of Rt profunda artery, ATA, SFA                         and popliteal artery. Current ABI:            Rt .94, Lt .63 Comparison Study: 07/04/2021 Performing Technologist: Almira Coaster RVS  Examination Guidelines: A complete evaluation includes B-mode imaging, spectral Doppler, color Doppler, and power Doppler as needed of all accessible portions of each vessel. Bilateral testing is considered an integral part of a complete examination. Limited examinations for reoccurring indications may be performed as noted.  +-----------+--------+-----+--------+----------+--------+ RIGHT      PSV cm/sRatioStenosisWaveform  Comments +-----------+--------+-----+--------+----------+--------+ CFA Distal 122                  biphasic           +-----------+--------+-----+--------+----------+--------+ DFA        106                  biphasic           +-----------+--------+-----+--------+----------+--------+ SFA Prox   144                  biphasic            +-----------+--------+-----+--------+----------+--------+ SFA Mid    62                   monophasic         +-----------+--------+-----+--------+----------+--------+ SFA Distal 45                   monophasic         +-----------+--------+-----+--------+----------+--------+ POP Distal 71                   monophasic         +-----------+--------+-----+--------+----------+--------+ ATA Distal 35                   monophasic         +-----------+--------+-----+--------+----------+--------+ PTA Distal 72                   monophasic         +-----------+--------+-----+--------+----------+--------+ PERO Distal51                   monophasic         +-----------+--------+-----+--------+----------+--------+  +-----------+--------+-----+--------+----------+--------+ LEFT       PSV cm/sRatioStenosisWaveform  Comments +-----------+--------+-----+--------+----------+--------+ CFA Distal 84                   biphasic           +-----------+--------+-----+--------+----------+--------+ DFA        73                   biphasic           +-----------+--------+-----+--------+----------+--------+ SFA Prox   48                   monophasic         +-----------+--------+-----+--------+----------+--------+ SFA Mid    48                   monophasic         +-----------+--------+-----+--------+----------+--------+ SFA Distal 57                   monophasic         +-----------+--------+-----+--------+----------+--------+ POP Distal 96  monophasic         +-----------+--------+-----+--------+----------+--------+ ATA Distal 78                   monophasic         +-----------+--------+-----+--------+----------+--------+ PTA Distal 62                   monophasic         +-----------+--------+-----+--------+----------+--------+ PERO Distal59                   monophasic          +-----------+--------+-----+--------+----------+--------+  Summary: Right: Imaging and Waveforms obtained throughout in the Right Lower Extremity.Monophasic Waveforms seen predominantly in the Right Lower Extremity. Left: Imaging and Waveforms obtained throughout in the Left Lower Extremity. Monophasic Waveforms seen predominantly in the Left Lower Extremity.  See table(s) above for measurements and observations. Electronically signed by [name] MD on 09/01/2021 at 9:49:07 AM.    Final    VAS [place] ABI WITH/WO TBI  Result Date: 09/01/2021  LOWER EXTREMITY DOPPLER STUDY Patient Name:  Joan [name]  Date of Exam:   08/29/2021 Medical Rec #: [id number]       Accession #:    [id number] Date of Birth: March 01, 1950       Patient Gender: F Patient Age:   52 years Exam Location:  [facility] Procedure:      VAS [place] ABI WITH/WO TBI Referring Phys: [name] --------------------------------------------------------------------------------  Indications: Peripheral artery disease.  Vascular Interventions: 08/15/2020 PTA of Lt SFA and popliteal artery. PTA of Lt                         distal SFA and tibioperoneal trunk. Stent Lt SFA and                         popliteal. 08/22/2020 PTA of Rt profunda artery, ATA, SFA                         and popliteal artery. Comparison Study: 07/04/2021 Performing Technologist: Almira Coaster RVS  Examination Guidelines: A complete evaluation includes at minimum, Doppler waveform signals and systolic blood pressure reading at the level of bilateral brachial, anterior tibial, and posterior tibial arteries, when vessel segments are accessible. Bilateral testing is considered an integral part of a complete examination. Photoelectric Plethysmograph (PPG) waveforms and toe systolic pressure readings are included as required and additional duplex testing as needed. Limited examinations for reoccurring indications may be performed as noted.  ABI Findings:  +---------+------------------+-----+--------+--------+ Right    Rt Pressure (mmHg)IndexWaveformComment  +---------+------------------+-----+--------+--------+ Brachial 134                                     +---------+------------------+-----+--------+--------+ ATA      117               0.85 biphasic         +---------+------------------+-----+--------+--------+ PTA      130               0.94 biphasic         +---------+------------------+-----+--------+--------+ Great Toe104               0.75 Normal           +---------+------------------+-----+--------+--------+ +---------+------------------+-----+----------+-------+ Left  Lt Pressure (mmHg)IndexWaveform  Comment +---------+------------------+-----+----------+-------+ Brachial 138                                      +---------+------------------+-----+----------+-------+ ATA      81                0.59 monophasic        +---------+------------------+-----+----------+-------+ PTA      87                0.63 monophasic        +---------+------------------+-----+----------+-------+ Great Toe62                0.45 Abnormal          +---------+------------------+-----+----------+-------+ +-------+-----------+-----------+------------+------------+ ABI/TBIToday's ABIToday's TBIPrevious ABIPrevious TBI +-------+-----------+-----------+------------+------------+ Right  .94        .75        .93         .61          +-------+-----------+-----------+------------+------------+ Left   .63        .45        .86         .34          +-------+-----------+-----------+------------+------------+  Right ABIs appear essentially unchanged compared to prior study on 07/04/2021. Right TBIs appear increased compared to prior study on 07/04/2021. Left ABIs appear decreased compared to prior study on 07/04/2021. Left TBIs appear increased compared to prior study on 07/04/2021.  Summary: Right: Resting  right ankle-brachial index indicates mild right lower extremity arterial disease. The right toe-brachial index is normal. Left: Resting left ankle-brachial index indicates moderate left lower extremity arterial disease. The left toe-brachial index is abnormal. *See table(s) above for measurements and observations.  Electronically signed by [name] MD on 09/01/2021 at 9:48:41 AM.    Final       Subjective: Seen and examined on the day of discharge.  Stable no distress.  Respiratory status baseline.  Stable for discharge home.  Discharge Exam: Vitals:   09/19/21 0753 09/19/21 0830  BP:  127/69  Pulse: 83 81  Resp: 18 18  Temp:  98 F (36.7 C)  SpO2: (!) 87% 90%   Vitals:   09/18/21 2059 09/19/21 0507 09/19/21 0753 09/19/21 0830  BP: (!) 155/68 (!) 156/80  127/69  Pulse: 93 83 83 81  Resp: _0 Temp: 98.3 F (36.8 C) 97.8 F (36.6 C)  98 F (36.7 C)  TempSrc: Oral Oral    SpO2: 96% 93% (!) 87% 90%  Weight:      Height:        General: Pt is alert, awake, not in acute distress Cardiovascular: RRR, S1/S2 +, no rubs, no gallops Respiratory: CTA bilaterally, no wheezing, no rhonchi Abdominal: Soft, NT, ND, bowel sounds + Extremities: no edema, no cyanosis    The results of significant diagnostics from this hospitalization (including imaging, microbiology, ancillary and laboratory) are listed below for reference.     Microbiology: Recent Results (from the past 240 hour(s))  Resp Panel by RT-PCR (Flu A&B, Covid) Anterior Nasal Swab     Status: None   Collection Time: 09/14/21  1:18 PM   Specimen: Anterior Nasal Swab  Result Value Ref Range Status   SARS Coronavirus 2 by RT PCR NEGATIVE NEGATIVE Final    Comment: (NOTE) SARS-CoV-2 target nucleic acids are NOT DETECTED.  The SARS-CoV-2 RNA is  generally detectable in upper respiratory specimens during the acute phase of infection. The lowest concentration of SARS-CoV-2 viral copies this assay can detect is 138  copies/mL. A negative result does not preclude SARS-Cov-2 infection and should not be used as the sole basis for treatment or other patient management decisions. A negative result may occur with  improper specimen collection/handling, submission of specimen other than nasopharyngeal swab, presence of viral mutation(s) within the areas targeted by this assay, and inadequate number of viral copies(<138 copies/mL). A negative result must be combined with clinical observations, patient history, and epidemiological information. The expected result is Negative.  Fact Sheet for Patients:  EntrepreneurPulse.com.au  Fact Sheet for Healthcare Providers:  IncredibleEmployment.be  This test is no t yet approved or cleared by the [place] FDA and  has been authorized for detection and/or diagnosis of SARS-CoV-2 by FDA under an Emergency Use Authorization (EUA). This EUA will remain  in effect (meaning this test can be used) for the duration of the COVID-19 declaration under Section 564(b)(1) of the Act, 21 U.S.C.section 360bbb-3(b)(1), unless the authorization is terminated  or revoked sooner.       Influenza A by PCR NEGATIVE NEGATIVE Final   Influenza B by PCR NEGATIVE NEGATIVE Final    Comment: (NOTE) The Xpert Xpress SARS-CoV-2/FLU/RSV plus assay is intended as an aid in the diagnosis of influenza from Nasopharyngeal swab specimens and should not be used as a sole basis for treatment. Nasal washings and aspirates are unacceptable for Xpert Xpress SARS-CoV-2/FLU/RSV testing.  Fact Sheet for Patients: EntrepreneurPulse.com.au  Fact Sheet for Healthcare Providers: IncredibleEmployment.be  This test is not yet approved or cleared by the [place] FDA and has been authorized for detection and/or diagnosis of SARS-CoV-2 by FDA under an Emergency Use Authorization (EUA). This EUA will remain in effect (meaning  this test can be used) for the duration of the COVID-19 declaration under Section 564(b)(1) of the Act, 21 U.S.C. section 360bbb-3(b)(1), unless the authorization is terminated or revoked.  Performed at [facility], [address] [place], [place] [address]   Culture, blood (routine x 2)     Status: None   Collection Time: 09/14/21  1:32 PM   Specimen: BLOOD RIGHT FOREARM  Result Value Ref Range Status   Specimen Description BLOOD RIGHT FOREARM  Final   Special Requests   Final    BOTTLES DRAWN AEROBIC AND ANAEROBIC Blood Culture adequate volume   Culture   Final    NO GROWTH 5 DAYS Performed at [facility], [address], [place], [place] [address]    Report Status 09/19/2021 FINAL  Final  Culture, blood (routine x 2)     Status: None   Collection Time: 09/14/21  4:58 PM   Specimen: BLOOD LEFT HAND  Result Value Ref Range Status   Specimen Description BLOOD LEFT HAND  Final   Special Requests   Final    BOTTLES DRAWN AEROBIC AND ANAEROBIC Blood Culture adequate volume   Culture   Final    NO GROWTH 5 DAYS Performed at [facility], [address] [place], [place] [id number]    Report Status 09/19/2021 FINAL  Final     Labs: BNP (last 3 results) No results for input(s): "BNP" in the last 8760 hours. Basic Metabolic Panel: Recent Labs  Lab 09/14/21 1317 09/15/21 0644 09/16/21 0915 09/18/21 0807  NA 137 140 136 137  K 4.1 3.8 3.8 3.5  CL 105 109 103 103  CO2 22  _0 GLUCOSE 133* 107* 127* 112*  BUN _1 CREATININE 0.74 0.64 0.52 0.69  CALCIUM 8.9 8.8* 8.7* 8.8*   Liver Function Tests: Recent Labs  Lab 09/14/21 1317  AST 44*  ALT 43  ALKPHOS 80  BILITOT 0.6  PROT 7.5  ALBUMIN 4.0   No results for input(s): "LIPASE", "AMYLASE" in the last 168 hours. No results for input(s): "AMMONIA" in the last 168 hours. CBC: Recent Labs  Lab 09/14/21 1317 09/15/21 0644 09/16/21 0915 09/18/21 0807  WBC 21.9* 17.0*  12.0* 9.6  NEUTROABS 18.8*  --  8.7* 5.8  HGB 13.1 11.5* 11.6* 11.3*  HCT 41.4 36.2 36.4 34.9*  MCV 90.0 88.3 89.9 88.4  PLT 281 246 247 285   Cardiac Enzymes: No results for input(s): "CKTOTAL", "CKMB", "CKMBINDEX", "TROPONINI" in the last 168 hours. BNP: Invalid input(s): "POCBNP" CBG: No results for input(s): "GLUCAP" in the last 168 hours. D-Dimer No results for input(s): "DDIMER" in the last 72 hours. Hgb A1c No results for input(s): "HGBA1C" in the last 72 hours. Lipid Profile No results for input(s): "CHOL", "HDL", "LDLCALC", "TRIG", "CHOLHDL", "LDLDIRECT" in the last 72 hours. Thyroid function studies No results for input(s): "TSH", "T4TOTAL", "T3FREE", "THYROIDAB" in the last 72 hours.  Invalid input(s): "FREET3" Anemia work up No results for input(s): "VITAMINB12", "FOLATE", "FERRITIN", "TIBC", "IRON", "RETICCTPCT" in the last 72 hours. Urinalysis    Component Value Date/Time   COLORURINE YELLOW 07/14/2013 1915   APPEARANCEUR Clear 05/25/2021 0857   LABSPEC 1.016 07/14/2013 1915   PHURINE 7.0 07/14/2013 1915   GLUCOSEU Negative 05/25/2021 0857   HGBUR MODERATE (A) 07/14/2013 1915   BILIRUBINUR Negative 05/25/2021 0857   KETONESUR 15 (A) 07/14/2013 1915   PROTEINUR Negative 05/25/2021 0857   PROTEINUR 100 (A) 07/14/2013 1915   UROBILINOGEN 1.0 07/14/2013 1915   NITRITE Negative 05/25/2021 0857   NITRITE NEGATIVE 07/14/2013 1915   LEUKOCYTESUR Negative 05/25/2021 0857   Sepsis Labs Recent Labs  Lab 09/14/21 1317 09/15/21 0644 09/16/21 0915 09/18/21 0807  WBC 21.9* 17.0* 12.0* 9.6   Microbiology Recent Results (from the past 240 hour(s))  Resp Panel by RT-PCR (Flu A&B, Covid) Anterior Nasal Swab     Status: None   Collection Time: 09/14/21  1:18 PM   Specimen: Anterior Nasal Swab  Result Value Ref Range Status   SARS Coronavirus 2 by RT PCR NEGATIVE NEGATIVE Final    Comment: (NOTE) SARS-CoV-2 target nucleic acids are NOT DETECTED.  The SARS-CoV-2  RNA is generally detectable in upper respiratory specimens during the acute phase of infection. The lowest concentration of SARS-CoV-2 viral copies this assay can detect is 138 copies/mL. A negative result does not preclude SARS-Cov-2 infection and should not be used as the sole basis for treatment or other patient management decisions. A negative result may occur with  improper specimen collection/handling, submission of specimen other than nasopharyngeal swab, presence of viral mutation(s) within the areas targeted by this assay, and inadequate number of viral copies(<138 copies/mL). A negative result must be combined with clinical observations, patient history, and epidemiological information. The expected result is Negative.  Fact Sheet for Patients:  EntrepreneurPulse.com.au  Fact Sheet for Healthcare Providers:  IncredibleEmployment.be  This test is no t yet approved or cleared by the [place] FDA and  has been authorized for detection and/or diagnosis of SARS-CoV-2 by FDA under an Emergency Use Authorization (EUA). This EUA will remain  in effect (meaning this test can be used) for  the duration of the COVID-19 declaration under Section 564(b)(1) of the Act, 21 U.S.C.section 360bbb-3(b)(1), unless the authorization is terminated  or revoked sooner.       Influenza A by PCR NEGATIVE NEGATIVE Final   Influenza B by PCR NEGATIVE NEGATIVE Final    Comment: (NOTE) The Xpert Xpress SARS-CoV-2/FLU/RSV plus assay is intended as an aid in the diagnosis of influenza from Nasopharyngeal swab specimens and should not be used as a sole basis for treatment. Nasal washings and aspirates are unacceptable for Xpert Xpress SARS-CoV-2/FLU/RSV testing.  Fact Sheet for Patients: EntrepreneurPulse.com.au  Fact Sheet for Healthcare Providers: IncredibleEmployment.be  This test is not yet approved or cleared by the  [place] FDA and has been authorized for detection and/or diagnosis of SARS-CoV-2 by FDA under an Emergency Use Authorization (EUA). This EUA will remain in effect (meaning this test can be used) for the duration of the COVID-19 declaration under Section 564(b)(1) of the Act, 21 U.S.C. section 360bbb-3(b)(1), unless the authorization is terminated or revoked.  Performed at [facility], [address] [place], [place] [address]   Culture, blood (routine x 2)     Status: None   Collection Time: 09/14/21  1:32 PM   Specimen: BLOOD RIGHT FOREARM  Result Value Ref Range Status   Specimen Description BLOOD RIGHT FOREARM  Final   Special Requests   Final    BOTTLES DRAWN AEROBIC AND ANAEROBIC Blood Culture adequate volume   Culture   Final    NO GROWTH 5 DAYS Performed at [facility], [address], [place], [place] [address]    Report Status 09/19/2021 FINAL  Final  Culture, blood (routine x 2)     Status: None   Collection Time: 09/14/21  4:58 PM   Specimen: BLOOD LEFT HAND  Result Value Ref Range Status   Specimen Description BLOOD LEFT HAND  Final   Special Requests   Final    BOTTLES DRAWN AEROBIC AND ANAEROBIC Blood Culture adequate volume   Culture   Final    NO GROWTH 5 DAYS Performed at [facility], [address], [place], [place] [address]    Report Status 09/19/2021 FINAL  Final     Time coordinating discharge: Over 30 minutes  SIGNED:   Sidney Ace, MD  [facility] 09/19/2021, 2:16 PM Pager   If 7PM-7AM, please contact night-coverage

## 2021-09-19 NOTE — Progress Notes (Signed)
No need for O2 tank at this time per Ralene Muskrat, MD.

## 2021-09-19 NOTE — Progress Notes (Signed)
AVS given and reviewed with pt. Medications discussed. All questions answered to satisfaction. Pt verbalized understanding of information given. Pt escorted off the unit with all belongings via wheelchair by volunteer services.  

## 2021-09-26 DIAGNOSIS — F3340 Major depressive disorder, recurrent, in remission, unspecified: Secondary | ICD-10-CM | POA: Diagnosis not present

## 2021-09-26 DIAGNOSIS — F419 Anxiety disorder, unspecified: Secondary | ICD-10-CM | POA: Diagnosis not present

## 2021-09-26 DIAGNOSIS — R8290 Unspecified abnormal findings in urine: Secondary | ICD-10-CM | POA: Diagnosis not present

## 2021-09-26 DIAGNOSIS — Z853 Personal history of malignant neoplasm of breast: Secondary | ICD-10-CM | POA: Diagnosis not present

## 2021-09-26 DIAGNOSIS — I10 Essential (primary) hypertension: Secondary | ICD-10-CM | POA: Diagnosis not present

## 2021-09-26 DIAGNOSIS — R7309 Other abnormal glucose: Secondary | ICD-10-CM | POA: Diagnosis not present

## 2021-09-26 DIAGNOSIS — K219 Gastro-esophageal reflux disease without esophagitis: Secondary | ICD-10-CM | POA: Diagnosis not present

## 2021-09-26 DIAGNOSIS — I739 Peripheral vascular disease, unspecified: Secondary | ICD-10-CM | POA: Diagnosis not present

## 2021-09-26 DIAGNOSIS — R3129 Other microscopic hematuria: Secondary | ICD-10-CM | POA: Diagnosis not present

## 2021-09-26 DIAGNOSIS — Z889 Allergy status to unspecified drugs, medicaments and biological substances status: Secondary | ICD-10-CM | POA: Diagnosis not present

## 2021-10-02 DIAGNOSIS — I739 Peripheral vascular disease, unspecified: Secondary | ICD-10-CM | POA: Diagnosis not present

## 2021-10-02 DIAGNOSIS — Z0000 Encounter for general adult medical examination without abnormal findings: Secondary | ICD-10-CM | POA: Diagnosis not present

## 2021-10-02 DIAGNOSIS — K130 Diseases of lips: Secondary | ICD-10-CM | POA: Diagnosis not present

## 2021-10-02 DIAGNOSIS — Z889 Allergy status to unspecified drugs, medicaments and biological substances status: Secondary | ICD-10-CM | POA: Diagnosis not present

## 2021-10-02 DIAGNOSIS — I10 Essential (primary) hypertension: Secondary | ICD-10-CM | POA: Diagnosis not present

## 2021-10-02 DIAGNOSIS — Z1389 Encounter for screening for other disorder: Secondary | ICD-10-CM | POA: Diagnosis not present

## 2021-10-02 DIAGNOSIS — Z09 Encounter for follow-up examination after completed treatment for conditions other than malignant neoplasm: Secondary | ICD-10-CM | POA: Diagnosis not present

## 2021-10-02 DIAGNOSIS — Z853 Personal history of malignant neoplasm of breast: Secondary | ICD-10-CM | POA: Diagnosis not present

## 2021-10-02 DIAGNOSIS — F32A Depression, unspecified: Secondary | ICD-10-CM | POA: Diagnosis not present

## 2021-10-03 ENCOUNTER — Ambulatory Visit (INDEPENDENT_AMBULATORY_CARE_PROVIDER_SITE_OTHER): Payer: Medicare HMO | Admitting: Vascular Surgery

## 2021-10-03 ENCOUNTER — Encounter (INDEPENDENT_AMBULATORY_CARE_PROVIDER_SITE_OTHER): Payer: Medicare HMO

## 2021-10-20 ENCOUNTER — Encounter: Payer: Self-pay | Admitting: Urology

## 2021-11-20 ENCOUNTER — Encounter (INDEPENDENT_AMBULATORY_CARE_PROVIDER_SITE_OTHER): Payer: Self-pay

## 2021-12-29 ENCOUNTER — Encounter (INDEPENDENT_AMBULATORY_CARE_PROVIDER_SITE_OTHER): Payer: Medicare HMO

## 2021-12-29 ENCOUNTER — Ambulatory Visit (INDEPENDENT_AMBULATORY_CARE_PROVIDER_SITE_OTHER): Payer: Medicare HMO | Admitting: Vascular Surgery

## 2022-01-25 DIAGNOSIS — Z09 Encounter for follow-up examination after completed treatment for conditions other than malignant neoplasm: Secondary | ICD-10-CM | POA: Diagnosis not present

## 2022-01-25 DIAGNOSIS — Z87891 Personal history of nicotine dependence: Secondary | ICD-10-CM | POA: Diagnosis not present

## 2022-01-25 DIAGNOSIS — Z889 Allergy status to unspecified drugs, medicaments and biological substances status: Secondary | ICD-10-CM | POA: Diagnosis not present

## 2022-01-25 DIAGNOSIS — I739 Peripheral vascular disease, unspecified: Secondary | ICD-10-CM | POA: Diagnosis not present

## 2022-01-25 DIAGNOSIS — I10 Essential (primary) hypertension: Secondary | ICD-10-CM | POA: Diagnosis not present

## 2022-01-25 DIAGNOSIS — F3340 Major depressive disorder, recurrent, in remission, unspecified: Secondary | ICD-10-CM | POA: Diagnosis not present

## 2022-01-25 DIAGNOSIS — Z853 Personal history of malignant neoplasm of breast: Secondary | ICD-10-CM | POA: Diagnosis not present

## 2022-01-25 DIAGNOSIS — R7309 Other abnormal glucose: Secondary | ICD-10-CM | POA: Diagnosis not present

## 2022-01-25 DIAGNOSIS — K130 Diseases of lips: Secondary | ICD-10-CM | POA: Diagnosis not present

## 2022-02-27 ENCOUNTER — Encounter (INDEPENDENT_AMBULATORY_CARE_PROVIDER_SITE_OTHER): Payer: Medicare HMO

## 2022-02-27 ENCOUNTER — Ambulatory Visit (INDEPENDENT_AMBULATORY_CARE_PROVIDER_SITE_OTHER): Payer: Medicare HMO | Admitting: Vascular Surgery

## 2022-04-05 DIAGNOSIS — Z0000 Encounter for general adult medical examination without abnormal findings: Secondary | ICD-10-CM | POA: Diagnosis not present

## 2022-04-05 DIAGNOSIS — J209 Acute bronchitis, unspecified: Secondary | ICD-10-CM | POA: Diagnosis not present

## 2022-04-05 DIAGNOSIS — J440 Chronic obstructive pulmonary disease with acute lower respiratory infection: Secondary | ICD-10-CM | POA: Diagnosis not present

## 2022-04-05 DIAGNOSIS — J449 Chronic obstructive pulmonary disease, unspecified: Secondary | ICD-10-CM | POA: Diagnosis not present

## 2022-04-05 DIAGNOSIS — F32A Depression, unspecified: Secondary | ICD-10-CM | POA: Diagnosis not present

## 2022-04-05 DIAGNOSIS — R059 Cough, unspecified: Secondary | ICD-10-CM | POA: Diagnosis not present

## 2022-04-05 DIAGNOSIS — I10 Essential (primary) hypertension: Secondary | ICD-10-CM | POA: Diagnosis not present

## 2022-04-05 DIAGNOSIS — J309 Allergic rhinitis, unspecified: Secondary | ICD-10-CM | POA: Diagnosis not present

## 2022-04-05 DIAGNOSIS — Z853 Personal history of malignant neoplasm of breast: Secondary | ICD-10-CM | POA: Diagnosis not present

## 2022-04-05 DIAGNOSIS — I739 Peripheral vascular disease, unspecified: Secondary | ICD-10-CM | POA: Diagnosis not present

## 2022-04-10 ENCOUNTER — Encounter (INDEPENDENT_AMBULATORY_CARE_PROVIDER_SITE_OTHER): Payer: Medicare HMO

## 2022-04-10 ENCOUNTER — Ambulatory Visit (INDEPENDENT_AMBULATORY_CARE_PROVIDER_SITE_OTHER): Payer: Medicare HMO | Admitting: Nurse Practitioner

## 2022-04-18 ENCOUNTER — Other Ambulatory Visit (INDEPENDENT_AMBULATORY_CARE_PROVIDER_SITE_OTHER): Payer: Self-pay | Admitting: Vascular Surgery

## 2022-04-18 DIAGNOSIS — I70213 Atherosclerosis of native arteries of extremities with intermittent claudication, bilateral legs: Secondary | ICD-10-CM

## 2022-04-26 ENCOUNTER — Ambulatory Visit (INDEPENDENT_AMBULATORY_CARE_PROVIDER_SITE_OTHER): Payer: Medicare HMO | Admitting: Nurse Practitioner

## 2022-04-26 ENCOUNTER — Encounter (INDEPENDENT_AMBULATORY_CARE_PROVIDER_SITE_OTHER): Payer: Medicare HMO

## 2022-05-03 ENCOUNTER — Other Ambulatory Visit: Payer: Self-pay | Admitting: Internal Medicine

## 2022-05-03 DIAGNOSIS — Z1231 Encounter for screening mammogram for malignant neoplasm of breast: Secondary | ICD-10-CM

## 2022-05-21 ENCOUNTER — Ambulatory Visit (INDEPENDENT_AMBULATORY_CARE_PROVIDER_SITE_OTHER): Payer: Medicare HMO

## 2022-05-21 ENCOUNTER — Ambulatory Visit (INDEPENDENT_AMBULATORY_CARE_PROVIDER_SITE_OTHER): Payer: Medicare HMO | Admitting: Nurse Practitioner

## 2022-05-21 ENCOUNTER — Encounter (INDEPENDENT_AMBULATORY_CARE_PROVIDER_SITE_OTHER): Payer: Self-pay | Admitting: Nurse Practitioner

## 2022-05-21 VITALS — BP 139/84 | HR 67 | Resp 17 | Ht 64.0 in | Wt 188.6 lb

## 2022-05-21 DIAGNOSIS — Z720 Tobacco use: Secondary | ICD-10-CM

## 2022-05-21 DIAGNOSIS — I70213 Atherosclerosis of native arteries of extremities with intermittent claudication, bilateral legs: Secondary | ICD-10-CM

## 2022-05-21 DIAGNOSIS — I10 Essential (primary) hypertension: Secondary | ICD-10-CM | POA: Diagnosis not present

## 2022-05-21 NOTE — Progress Notes (Signed)
Subjective:    Patient ID: Joan [name], female    DOB: 1950/11/15, 72 y.o.   MRN: [id number] Chief Complaint  Patient presents with   Follow-up    The patient returns to the office for followup and review of the noninvasive studies.  The patient's follow-up is somewhat delayed however she recently lost her partner of 30 years which has understandably been very difficult and disruptive for her.  There have been no interval changes in lower extremity symptoms. No interval shortening of the patient's claudication distance or development of rest pain symptoms. No new ulcers or wounds have occurred since the last visit.  There have been no significant changes to the patient's overall health care.  The patient denies amaurosis fugax or recent TIA symptoms. There are no documented recent neurological changes noted. There is no history of DVT, PE or superficial thrombophlebitis. The patient denies recent episodes of angina or shortness of breath.   ABI Rt=0.90 and Lt=0.65  (previous ABI's Rt=0.94 and Lt=0.63) Duplex ultrasound of the bilateral tibial vessels reveals monophasic currently throughout the bilateral lower extremities but stents are open and patent.    Review of Systems  Cardiovascular:  Negative for leg swelling.  All other systems reviewed and are negative.      Objective:   Physical Exam Vitals reviewed.  HENT:     Head: Normocephalic.  Cardiovascular:     Rate and Rhythm: Normal rate.  Pulmonary:     Effort: Pulmonary effort is normal.  Skin:    General: Skin is warm and dry.  Neurological:     Mental Status: She is alert and oriented to person, place, and time.  Psychiatric:        Mood and Affect: Mood normal.        Behavior: Behavior normal.        Thought Content: Thought content normal.        Judgment: Judgment normal.     BP 139/84 (BP Location: Left Arm)   Pulse 67   Resp 17   Ht 5\' 4"  (1.626 m)   Wt 188 lb 9.6 oz (85.5 kg)   BMI 32.37 kg/m    Past Medical History:  Diagnosis Date   Angina at rest    Anxiety    Arthritis    Breast cancer, right breast (HCC)    s/p lumpectomy and adjuvant XRT   Bronchitis    Cancer (HCC)    Claudication, intermittent (HCC)    Depression    Dyspnea    Environmental and seasonal allergies    GERD (gastroesophageal reflux disease)    with spicy food   Hepatitis C antibody test positive    Hypertension    Peripheral vascular disease (HCC)    Personal history of radiation therapy 2002   RIGHT lumpectomy   TIA (transient ischemic attack)    no residual. Many years ago    Social History   Socioeconomic History   Marital status: Single    Spouse name: Not on file   Number of children: Not on file   Years of education: Not on file   Highest education level: Not on file  Occupational History   Occupation: build harnesses for boats , large items  Tobacco Use   Smoking status: Former    Packs/day: 0.50    Years: 50.00    Additional pack years: 0.00    Total pack years: 25.00    Types: Cigarettes   Smokeless tobacco: Never  Tobacco comments:    trying to quit on her own  Vaping Use   Vaping Use: Never used  Substance and Sexual Activity   Alcohol use: Not Currently    Comment: Beer heavy everyday in past - last 10 years ago   Drug use: No   Sexual activity: Not on file  Other Topics Concern   Not on file  Social History Narrative   Patient lives with her partner (handicapped). Home care to help partner out.   Partner is bedridden.    Patient has people to help her out once home.   Social Determinants of Health   Financial Resource Strain: Not on file  Food Insecurity: Not on file  Transportation Needs: Not on file  Physical Activity: Not on file  Stress: Not on file  Social Connections: Not on file  Intimate Partner Violence: Not on file    Past Surgical History:  Procedure Laterality Date   APPENDECTOMY     BREAST EXCISIONAL BIOPSY Right    11/1999   BREAST  LUMPECTOMY Right 2002   RIGHT lumpectomy w/ radiation 2002   BREAST SURGERY     CHOLECYSTECTOMY     ENDARTERECTOMY FEMORAL Bilateral 06/22/2020   Procedure: ENDARTERECTOMY FEMORAL;  Surgeon: Annice Needy, MD;  Location: [facility];  Service: Vascular;  Laterality: Bilateral;   INSERTION OF ILIAC STENT Bilateral 06/22/2020   Procedure: INSERTION OF ILIAC STENT;  Surgeon: Annice Needy, MD;  Location: [facility];  Service: Vascular;  Laterality: Bilateral;   LOWER EXTREMITY ANGIOGRAPHY Left 11/14/2017   Procedure: LOWER EXTREMITY ANGIOGRAPHY;  Surgeon: Annice Needy, MD;  Location: [facility];  Service: Cardiovascular;  Laterality: Left;   LOWER EXTREMITY ANGIOGRAPHY Right 04/21/2020   Procedure: LOWER EXTREMITY ANGIOGRAPHY;  Surgeon: Annice Needy, MD;  Location: [facility];  Service: Cardiovascular;  Laterality: Right;   LOWER EXTREMITY ANGIOGRAPHY Left 08/15/2020   Procedure: LOWER EXTREMITY ANGIOGRAPHY;  Surgeon: Annice Needy, MD;  Location: [facility];  Service: Cardiovascular;  Laterality: Left;   LOWER EXTREMITY ANGIOGRAPHY Right 08/22/2020   Procedure: LOWER EXTREMITY ANGIOGRAPHY;  Surgeon: Annice Needy, MD;  Location: [facility];  Service: Cardiovascular;  Laterality: Right;    Family History  Problem Relation Age of Onset   Breast cancer Mother 57   Breast cancer Maternal Aunt    Breast cancer Maternal Grandmother    Breast cancer Sister 59    Allergies  Allergen Reactions   Seasonal Ic [Cholestatin]     Seasonal allergies       Latest Ref Rng & Units 09/18/2021    8:07 AM 09/16/2021    9:15 AM 09/15/2021    6:44 AM  CBC  WBC 4.0 - 10.5 K/uL 9.6  12.0  17.0   Hemoglobin 12.0 - 15.0 g/dL 60.4  54.0  98.1   Hematocrit 36.0 - 46.0 % 34.9  36.4  36.2   Platelets 150 - 400 K/uL 285  247  246       CMP     Component Value Date/Time   NA 137 09/18/2021 0807   K 3.5 09/18/2021 0807   CL 103 09/18/2021 0807   CO2 24 09/18/2021 0807   GLUCOSE  112 (H) 09/18/2021 0807   BUN 11 09/18/2021 0807   CREATININE 0.69 09/18/2021 0807   CALCIUM 8.8 (L) 09/18/2021 0807   PROT 7.5 09/14/2021 1317   ALBUMIN 4.0 09/14/2021 1317   AST 44 (H) 09/14/2021 1317  ALT 43 09/14/2021 1317   ALKPHOS 80 09/14/2021 1317   BILITOT 0.6 09/14/2021 1317   GFRNONAA >60 09/18/2021 0807   GFRAA >60 11/12/2017 0948     No results found.     Assessment & Plan:   1. Atherosclerosis of native artery of both lower extremities with intermittent claudication (HCC)  Recommend:  The patient has evidence of atherosclerosis of the lower extremities with claudication.  The patient does not voice lifestyle limiting changes at this point in time.  Noninvasive studies do not suggest clinically significant change.  No invasive studies, angiography or surgery at this time The patient should continue walking and begin a more formal exercise program.  The patient should continue antiplatelet therapy and aggressive treatment of the lipid abnormalities  No changes in the patient's medications at this time  Continued surveillance is indicated as atherosclerosis is likely to progress with time.    The patient will continue follow up with noninvasive studies as ordered.   2. Primary hypertension Continue antihypertensive medications as already ordered, these medications have been reviewed and there are no changes at this time.  3. Tobacco user Continues to abstain    Current Outpatient Medications on File Prior to Visit  Medication Sig Dispense Refill   acetaminophen (TYLENOL) 325 MG tablet Take 1-2 tablets (325-650 mg total) by mouth every 4 (four) hours as needed for mild pain (or temp >/= 101 F). 25 tablet 1   albuterol (VENTOLIN HFA) 108 (90 Base) MCG/ACT inhaler Inhale 2 puffs into the lungs every 6 (six) hours as needed for wheezing or shortness of breath. 8 g 1   ALPRAZolam (XANAX) 0.5 MG tablet Take 0.5 mg by mouth 2 (two) times daily as needed for  anxiety.  0   aspirin EC 81 MG tablet Take 81 mg by mouth daily.     benzonatate (TESSALON PERLES) 100 MG capsule Take 1 capsule (100 mg total) by mouth 3 (three) times daily as needed for cough. 30 capsule 0   clopidogrel (PLAVIX) 75 MG tablet Take 1 tablet (75 mg total) by mouth daily. 30 tablet 6   loratadine (CLARITIN) 10 MG tablet Take 10 mg by mouth daily as needed for allergies.     losartan (COZAAR) 50 MG tablet Take by mouth.     rosuvastatin (CRESTOR) 5 MG tablet Take 5 mg by mouth daily.     sertraline (ZOLOFT) 100 MG tablet Take 100 mg by mouth daily.     pantoprazole (PROTONIX) 40 MG tablet Take by mouth.     No current facility-administered medications on file prior to visit.    There are no Patient Instructions on file for this visit. No follow-ups on file.   Georgiana Spinner, NP

## 2022-05-22 LAB — VAS US ABI WITH/WO TBI
Left ABI: 0.65
Right ABI: 0.9

## 2022-05-24 ENCOUNTER — Telehealth: Payer: Self-pay | Admitting: Family Medicine

## 2022-05-24 ENCOUNTER — Ambulatory Visit
Admission: RE | Admit: 2022-05-24 | Discharge: 2022-05-24 | Disposition: A | Discharge status: Home / Self Care | Payer: Medicare HMO | Source: Ambulatory Visit | Attending: Internal Medicine | Admitting: Internal Medicine

## 2022-05-24 DIAGNOSIS — Z1231 Encounter for screening mammogram for malignant neoplasm of breast: Secondary | ICD-10-CM | POA: Insufficient documentation

## 2022-05-24 DIAGNOSIS — E278 Other specified disorders of adrenal gland: Secondary | ICD-10-CM

## 2022-05-24 NOTE — Telephone Encounter (Signed)
Patient states she would prefer to have the Ct scan if possible. She can't do the MRI.

## 2022-06-01 ENCOUNTER — Ambulatory Visit: Payer: Medicare HMO | Admitting: Urology

## 2022-06-05 ENCOUNTER — Ambulatory Visit
Admission: RE | Admit: 2022-06-05 | Discharge: 2022-06-05 | Disposition: A | Discharge status: Home / Self Care | Payer: Medicare HMO | Source: Ambulatory Visit | Attending: Urology | Admitting: Urology

## 2022-06-05 DIAGNOSIS — E278 Other specified disorders of adrenal gland: Secondary | ICD-10-CM | POA: Diagnosis not present

## 2022-06-05 DIAGNOSIS — I700 Atherosclerosis of aorta: Secondary | ICD-10-CM | POA: Diagnosis not present

## 2022-06-05 DIAGNOSIS — N289 Disorder of kidney and ureter, unspecified: Secondary | ICD-10-CM | POA: Diagnosis not present

## 2022-06-05 LAB — POCT I-STAT CREATININE: Creatinine, Ser: 0.8 mg/dL (ref 0.44–1.00)

## 2022-06-05 MED ORDER — IOHEXOL 300 MG/ML  SOLN
100.0000 mL | Freq: Once | INTRAMUSCULAR | Status: AC | PRN
  Administered 2022-06-05: 100 mL via INTRAVENOUS

## 2022-06-06 ENCOUNTER — Ambulatory Visit: Payer: Medicare HMO | Admitting: Urology

## 2022-06-06 ENCOUNTER — Encounter: Payer: Self-pay | Admitting: Urology

## 2022-06-06 VITALS — BP 151/72 | HR 83 | Ht 64.0 in | Wt 180.0 lb

## 2022-06-06 DIAGNOSIS — E278 Other specified disorders of adrenal gland: Secondary | ICD-10-CM

## 2022-06-06 DIAGNOSIS — R3129 Other microscopic hematuria: Secondary | ICD-10-CM | POA: Diagnosis not present

## 2022-06-06 DIAGNOSIS — N200 Calculus of kidney: Secondary | ICD-10-CM | POA: Diagnosis not present

## 2022-06-06 NOTE — Progress Notes (Signed)
I, Duke Salvia, acting as a Neurosurgeon for Joan Altes, MD., have documented all relevant documentation on the behalf of Joan Altes, MD, as directed by  Joan Altes, MD while in the presence of Joan Altes, MD.   06/06/2022 2:16 PM   Lorre Munroe 1950/07/27 [id number]  Referring provider: Barbette Reichmann, MD [address] [facility] [place],  [place] [address]  Chief Complaint  Patient presents with   Other    HPI: Joan [name] is a 72 y.o. female presenting for follow up.  Initially seen 04/10/2021 for high risk hematuria. CT urogram showed small bilateral non-obstructing adrenal calculi and small bilateral adrenal nodules for which radiology recommended an adrenal mass protocol CT or MRI. Cystoscopy showed no bladder mucosal abnormalities. Follow up CT abdomen/pelvis without contrast performed yesterday showed stable bilateral adrenal masses consistent with benign adenomas. Stable bilateral renal calculi in a Bosniak 1 right lower pole renal system. She has intermittent stress incontinence worse with coughing. No dysuria or gross hematuria.   PMH: Past Medical History:  Diagnosis Date   Angina at rest    Anxiety    Arthritis    Breast cancer, right breast (HCC)    s/p lumpectomy and adjuvant XRT   Bronchitis    Cancer (HCC)    Claudication, intermittent (HCC)    Depression    Dyspnea    Environmental and seasonal allergies    GERD (gastroesophageal reflux disease)    with spicy food   Hepatitis C antibody test positive    Hypertension    Peripheral vascular disease (HCC)    Personal history of radiation therapy 2002   RIGHT lumpectomy   TIA (transient ischemic attack)    no residual. Many years ago    Surgical History: Past Surgical History:  Procedure Laterality Date   APPENDECTOMY     BREAST EXCISIONAL BIOPSY Right    11/1999   BREAST LUMPECTOMY Right 2002   RIGHT lumpectomy w/ radiation 2002   BREAST SURGERY      CHOLECYSTECTOMY     ENDARTERECTOMY FEMORAL Bilateral 06/22/2020   Procedure: ENDARTERECTOMY FEMORAL;  Surgeon: Annice Needy, MD;  Location: [facility];  Service: Vascular;  Laterality: Bilateral;   INSERTION OF ILIAC STENT Bilateral 06/22/2020   Procedure: INSERTION OF ILIAC STENT;  Surgeon: Annice Needy, MD;  Location: [facility];  Service: Vascular;  Laterality: Bilateral;   LOWER EXTREMITY ANGIOGRAPHY Left 11/14/2017   Procedure: LOWER EXTREMITY ANGIOGRAPHY;  Surgeon: Annice Needy, MD;  Location: [facility];  Service: Cardiovascular;  Laterality: Left;   LOWER EXTREMITY ANGIOGRAPHY Right 04/21/2020   Procedure: LOWER EXTREMITY ANGIOGRAPHY;  Surgeon: Annice Needy, MD;  Location: [facility];  Service: Cardiovascular;  Laterality: Right;   LOWER EXTREMITY ANGIOGRAPHY Left 08/15/2020   Procedure: LOWER EXTREMITY ANGIOGRAPHY;  Surgeon: Annice Needy, MD;  Location: [facility];  Service: Cardiovascular;  Laterality: Left;   LOWER EXTREMITY ANGIOGRAPHY Right 08/22/2020   Procedure: LOWER EXTREMITY ANGIOGRAPHY;  Surgeon: Annice Needy, MD;  Location: [facility];  Service: Cardiovascular;  Laterality: Right;    Home Medications:  Allergies as of 06/06/2022       Reactions   Seasonal Ic [cholestatin]    Seasonal allergies        Medication List        Accurate as of Jun 06, 2022  2:16 PM. If you have any questions, ask your nurse or  doctor.          acetaminophen 325 MG tablet Commonly known as: TYLENOL Take 1-2 tablets (325-650 mg total) by mouth every 4 (four) hours as needed for mild pain (or temp >/= 101 F).   albuterol 108 (90 Base) MCG/ACT inhaler Commonly known as: VENTOLIN HFA Inhale 2 puffs into the lungs every 6 (six) hours as needed for wheezing or shortness of breath.   ALPRAZolam 0.5 MG tablet Commonly known as: XANAX Take 0.5 mg by mouth 2 (two) times daily as needed for anxiety.   aspirin EC 81 MG tablet Take 81 mg by mouth daily.    benzonatate 100 MG capsule Commonly known as: Tessalon Perles Take 1 capsule (100 mg total) by mouth 3 (three) times daily as needed for cough.   clopidogrel 75 MG tablet Commonly known as: Plavix Take 1 tablet (75 mg total) by mouth daily.   loratadine 10 MG tablet Commonly known as: CLARITIN Take 10 mg by mouth daily as needed for allergies.   losartan 50 MG tablet Commonly known as: COZAAR Take by mouth.   pantoprazole 40 MG tablet Commonly known as: PROTONIX Take by mouth.   rosuvastatin 5 MG tablet Commonly known as: CRESTOR Take 5 mg by mouth daily.   sertraline 100 MG tablet Commonly known as: ZOLOFT Take 100 mg by mouth daily.        Allergies:  Allergies  Allergen Reactions   Seasonal Ic [Cholestatin]     Seasonal allergies    Family History: Family History  Problem Relation Age of Onset   Breast cancer Mother 47   Breast cancer Maternal Aunt    Breast cancer Maternal Grandmother    Breast cancer Sister 57    Social History:  reports that she has quit smoking. Her smoking use included cigarettes. She has a 25.00 pack-year smoking history. She has never used smokeless tobacco. She reports that she does not currently use alcohol. She reports that she does not use drugs.   Physical Exam: BP (!) 151/72   Pulse 83   Ht 5\' 4"  (1.626 m)   Wt 180 lb (81.6 kg)   BMI 30.90 kg/m   Constitutional:  Alert and oriented, No acute distress. HEENT: [place] AT, moist mucus membranes.  Trachea midline, no masses. Cardiovascular: No clubbing, cyanosis, or edema. Respiratory: Normal respiratory effort, no increased work of breathing. Psychiatric: Normal mood and affect.   Assessment & Plan:    1. Bilateral adrenal masses Adrenal mass protocol consistent with benign adenomas. Endicrinology referral to evaluate for function.  2. Bilateral renal calculi Small common non-obstructing bilateral renal calculi. 2 year follow up with [facility].  3. Microhematuria UA  January 2024 showed no RBCs.   I have reviewed the above documentation for accuracy and completeness, and I agree with the above.   Joan Altes, MD  [facility] [address], [address] [place], [place] [address] [phone]

## 2022-06-08 ENCOUNTER — Encounter: Payer: Self-pay | Admitting: Urology

## 2022-08-03 DIAGNOSIS — F3340 Major depressive disorder, recurrent, in remission, unspecified: Secondary | ICD-10-CM | POA: Diagnosis not present

## 2022-08-03 DIAGNOSIS — R7309 Other abnormal glucose: Secondary | ICD-10-CM | POA: Diagnosis not present

## 2022-08-03 DIAGNOSIS — I10 Essential (primary) hypertension: Secondary | ICD-10-CM | POA: Diagnosis not present

## 2022-08-03 DIAGNOSIS — Z6832 Body mass index (BMI) 32.0-32.9, adult: Secondary | ICD-10-CM | POA: Diagnosis not present

## 2022-08-03 DIAGNOSIS — J209 Acute bronchitis, unspecified: Secondary | ICD-10-CM | POA: Diagnosis not present

## 2022-08-03 DIAGNOSIS — Z889 Allergy status to unspecified drugs, medicaments and biological substances status: Secondary | ICD-10-CM | POA: Diagnosis not present

## 2022-08-03 DIAGNOSIS — J440 Chronic obstructive pulmonary disease with acute lower respiratory infection: Secondary | ICD-10-CM | POA: Diagnosis not present

## 2022-08-03 DIAGNOSIS — I739 Peripheral vascular disease, unspecified: Secondary | ICD-10-CM | POA: Diagnosis not present

## 2022-08-08 DIAGNOSIS — Z853 Personal history of malignant neoplasm of breast: Secondary | ICD-10-CM | POA: Diagnosis not present

## 2022-08-08 DIAGNOSIS — I739 Peripheral vascular disease, unspecified: Secondary | ICD-10-CM | POA: Diagnosis not present

## 2022-08-08 DIAGNOSIS — R7303 Prediabetes: Secondary | ICD-10-CM | POA: Diagnosis not present

## 2022-08-08 DIAGNOSIS — I10 Essential (primary) hypertension: Secondary | ICD-10-CM | POA: Diagnosis not present

## 2022-08-08 DIAGNOSIS — J449 Chronic obstructive pulmonary disease, unspecified: Secondary | ICD-10-CM | POA: Diagnosis not present

## 2022-08-08 DIAGNOSIS — F32A Depression, unspecified: Secondary | ICD-10-CM | POA: Diagnosis not present

## 2022-08-08 DIAGNOSIS — R7309 Other abnormal glucose: Secondary | ICD-10-CM | POA: Diagnosis not present

## 2022-08-29 ENCOUNTER — Ambulatory Visit: Payer: Medicare HMO | Admitting: Gastroenterology

## 2022-09-17 DIAGNOSIS — Z1211 Encounter for screening for malignant neoplasm of colon: Secondary | ICD-10-CM | POA: Diagnosis not present

## 2022-09-17 DIAGNOSIS — Z1212 Encounter for screening for malignant neoplasm of rectum: Secondary | ICD-10-CM | POA: Diagnosis not present

## 2022-09-22 LAB — EXTERNAL GENERIC LAB PROCEDURE: COLOGUARD: POSITIVE — AB

## 2022-09-27 DIAGNOSIS — R7309 Other abnormal glucose: Secondary | ICD-10-CM | POA: Diagnosis not present

## 2022-09-27 DIAGNOSIS — I739 Peripheral vascular disease, unspecified: Secondary | ICD-10-CM | POA: Diagnosis not present

## 2022-09-27 DIAGNOSIS — R21 Rash and other nonspecific skin eruption: Secondary | ICD-10-CM | POA: Diagnosis not present

## 2022-09-27 DIAGNOSIS — F32A Depression, unspecified: Secondary | ICD-10-CM | POA: Diagnosis not present

## 2022-09-27 DIAGNOSIS — Z853 Personal history of malignant neoplasm of breast: Secondary | ICD-10-CM | POA: Diagnosis not present

## 2022-09-27 DIAGNOSIS — I10 Essential (primary) hypertension: Secondary | ICD-10-CM | POA: Diagnosis not present

## 2022-09-27 DIAGNOSIS — M310 Hypersensitivity angiitis: Secondary | ICD-10-CM | POA: Diagnosis not present

## 2022-09-27 DIAGNOSIS — J449 Chronic obstructive pulmonary disease, unspecified: Secondary | ICD-10-CM | POA: Diagnosis not present

## 2022-09-27 DIAGNOSIS — J309 Allergic rhinitis, unspecified: Secondary | ICD-10-CM | POA: Diagnosis not present

## 2022-10-15 ENCOUNTER — Inpatient Hospital Stay
Admission: EM | Admit: 2022-10-15 | Discharge: 2022-10-23 | DRG: 271 | Disposition: A | Discharge status: Home Health | Payer: Medicare HMO | Attending: Student | Admitting: Student

## 2022-10-15 ENCOUNTER — Emergency Department: Payer: Medicare HMO

## 2022-10-15 ENCOUNTER — Other Ambulatory Visit: Payer: Self-pay

## 2022-10-15 DIAGNOSIS — Z8673 Personal history of transient ischemic attack (TIA), and cerebral infarction without residual deficits: Secondary | ICD-10-CM | POA: Diagnosis not present

## 2022-10-15 DIAGNOSIS — Z7982 Long term (current) use of aspirin: Secondary | ICD-10-CM

## 2022-10-15 DIAGNOSIS — M7989 Other specified soft tissue disorders: Secondary | ICD-10-CM | POA: Diagnosis not present

## 2022-10-15 DIAGNOSIS — R21 Rash and other nonspecific skin eruption: Secondary | ICD-10-CM | POA: Diagnosis present

## 2022-10-15 DIAGNOSIS — T82856A Stenosis of peripheral vascular stent, initial encounter: Principal | ICD-10-CM | POA: Diagnosis present

## 2022-10-15 DIAGNOSIS — Z9889 Other specified postprocedural states: Secondary | ICD-10-CM | POA: Diagnosis not present

## 2022-10-15 DIAGNOSIS — L03119 Cellulitis of unspecified part of limb: Principal | ICD-10-CM | POA: Diagnosis present

## 2022-10-15 DIAGNOSIS — I2510 Atherosclerotic heart disease of native coronary artery without angina pectoris: Secondary | ICD-10-CM | POA: Diagnosis present

## 2022-10-15 DIAGNOSIS — I70235 Atherosclerosis of native arteries of right leg with ulceration of other part of foot: Secondary | ICD-10-CM | POA: Diagnosis not present

## 2022-10-15 DIAGNOSIS — F419 Anxiety disorder, unspecified: Secondary | ICD-10-CM | POA: Diagnosis present

## 2022-10-15 DIAGNOSIS — J441 Chronic obstructive pulmonary disease with (acute) exacerbation: Secondary | ICD-10-CM

## 2022-10-15 DIAGNOSIS — I10 Essential (primary) hypertension: Secondary | ICD-10-CM | POA: Insufficient documentation

## 2022-10-15 DIAGNOSIS — I70245 Atherosclerosis of native arteries of left leg with ulceration of other part of foot: Secondary | ICD-10-CM | POA: Diagnosis not present

## 2022-10-15 DIAGNOSIS — L089 Local infection of the skin and subcutaneous tissue, unspecified: Secondary | ICD-10-CM | POA: Diagnosis not present

## 2022-10-15 DIAGNOSIS — Z79899 Other long term (current) drug therapy: Secondary | ICD-10-CM | POA: Diagnosis not present

## 2022-10-15 DIAGNOSIS — K219 Gastro-esophageal reflux disease without esophagitis: Secondary | ICD-10-CM

## 2022-10-15 DIAGNOSIS — Z87891 Personal history of nicotine dependence: Secondary | ICD-10-CM

## 2022-10-15 DIAGNOSIS — I7789 Other specified disorders of arteries and arterioles: Secondary | ICD-10-CM | POA: Diagnosis present

## 2022-10-15 DIAGNOSIS — Z66 Do not resuscitate: Secondary | ICD-10-CM | POA: Diagnosis not present

## 2022-10-15 DIAGNOSIS — M2012 Hallux valgus (acquired), left foot: Secondary | ICD-10-CM | POA: Diagnosis not present

## 2022-10-15 DIAGNOSIS — E785 Hyperlipidemia, unspecified: Secondary | ICD-10-CM | POA: Diagnosis not present

## 2022-10-15 DIAGNOSIS — L97519 Non-pressure chronic ulcer of other part of right foot with unspecified severity: Secondary | ICD-10-CM | POA: Diagnosis not present

## 2022-10-15 DIAGNOSIS — Z923 Personal history of irradiation: Secondary | ICD-10-CM

## 2022-10-15 DIAGNOSIS — Y838 Other surgical procedures as the cause of abnormal reaction of the patient, or of later complication, without mention of misadventure at the time of the procedure: Secondary | ICD-10-CM | POA: Diagnosis present

## 2022-10-15 DIAGNOSIS — I70213 Atherosclerosis of native arteries of extremities with intermittent claudication, bilateral legs: Secondary | ICD-10-CM

## 2022-10-15 DIAGNOSIS — B1920 Unspecified viral hepatitis C without hepatic coma: Secondary | ICD-10-CM | POA: Diagnosis not present

## 2022-10-15 DIAGNOSIS — Z7902 Long term (current) use of antithrombotics/antiplatelets: Secondary | ICD-10-CM | POA: Diagnosis not present

## 2022-10-15 DIAGNOSIS — M7732 Calcaneal spur, left foot: Secondary | ICD-10-CM | POA: Diagnosis not present

## 2022-10-15 DIAGNOSIS — Z803 Family history of malignant neoplasm of breast: Secondary | ICD-10-CM

## 2022-10-15 DIAGNOSIS — L97529 Non-pressure chronic ulcer of other part of left foot with unspecified severity: Secondary | ICD-10-CM | POA: Diagnosis present

## 2022-10-15 DIAGNOSIS — L03116 Cellulitis of left lower limb: Secondary | ICD-10-CM | POA: Diagnosis not present

## 2022-10-15 DIAGNOSIS — Z6832 Body mass index (BMI) 32.0-32.9, adult: Secondary | ICD-10-CM

## 2022-10-15 DIAGNOSIS — Z95820 Peripheral vascular angioplasty status with implants and grafts: Secondary | ICD-10-CM | POA: Diagnosis not present

## 2022-10-15 DIAGNOSIS — E669 Obesity, unspecified: Secondary | ICD-10-CM | POA: Diagnosis not present

## 2022-10-15 DIAGNOSIS — Z853 Personal history of malignant neoplasm of breast: Secondary | ICD-10-CM

## 2022-10-15 DIAGNOSIS — I739 Peripheral vascular disease, unspecified: Secondary | ICD-10-CM

## 2022-10-15 DIAGNOSIS — I70263 Atherosclerosis of native arteries of extremities with gangrene, bilateral legs: Secondary | ICD-10-CM | POA: Diagnosis present

## 2022-10-15 DIAGNOSIS — L03115 Cellulitis of right lower limb: Secondary | ICD-10-CM | POA: Diagnosis present

## 2022-10-15 DIAGNOSIS — I76 Septic arterial embolism: Secondary | ICD-10-CM | POA: Diagnosis not present

## 2022-10-15 DIAGNOSIS — Z9049 Acquired absence of other specified parts of digestive tract: Secondary | ICD-10-CM | POA: Diagnosis not present

## 2022-10-15 DIAGNOSIS — M310 Hypersensitivity angiitis: Secondary | ICD-10-CM | POA: Diagnosis not present

## 2022-10-15 DIAGNOSIS — Z95828 Presence of other vascular implants and grafts: Secondary | ICD-10-CM | POA: Diagnosis not present

## 2022-10-15 LAB — BASIC METABOLIC PANEL
Anion gap: 10 (ref 5–15)
BUN: 9 mg/dL (ref 8–23)
CO2: 27 mmol/L (ref 22–32)
Calcium: 9.4 mg/dL (ref 8.9–10.3)
Chloride: 102 mmol/L (ref 98–111)
Creatinine, Ser: 0.65 mg/dL (ref 0.44–1.00)
GFR, Estimated: >60 mL/min (ref >60)
Glucose, Bld: 86 mg/dL (ref 70–99)
Potassium: 4.1 mmol/L (ref 3.5–5.1)
Sodium: 139 mmol/L (ref 135–145)

## 2022-10-15 LAB — CBC
HCT: 44.2 % (ref 36.0–46.0)
Hemoglobin: 13.9 g/dL (ref 12.0–15.0)
MCH: 28.7 pg (ref 26.0–34.0)
MCHC: 31.4 g/dL (ref 30.0–36.0)
MCV: 91.1 fL (ref 80.0–100.0)
Platelets: 282 10*3/uL (ref 150–400)
RBC: 4.85 MIL/uL (ref 3.87–5.11)
RDW: 13.8 % (ref 11.5–15.5)
WBC: 9.5 10*3/uL (ref 4.0–10.5)
nRBC: 0 % (ref 0.0–0.2)

## 2022-10-15 LAB — SEDIMENTATION RATE: Sed Rate: 35 mm/hr — ABNORMAL HIGH (ref 0–30)

## 2022-10-15 LAB — LACTIC ACID, PLASMA: Lactic Acid, Venous: 1.7 mmol/L (ref 0.5–1.9)

## 2022-10-15 MED ORDER — SODIUM CHLORIDE 0.9 % IV SOLN
1.0000 g | Freq: Once | INTRAVENOUS | Status: AC
  Administered 2022-10-15: 1 g via INTRAVENOUS
  Filled 2022-10-15: qty 10

## 2022-10-15 MED ORDER — ENOXAPARIN SODIUM 60 MG/0.6ML IJ SOSY
0.5000 mg/kg | PREFILLED_SYRINGE | INTRAMUSCULAR | Status: DC
  Administered 2022-10-15 – 2022-10-19 (×5): 42.5 mg via SUBCUTANEOUS
  Filled 2022-10-15 (×5): qty 0.6

## 2022-10-15 MED ORDER — ACETAMINOPHEN 325 MG PO TABS
650.0000 mg | ORAL_TABLET | Freq: Four times a day (QID) | ORAL | Status: DC | PRN
  Administered 2022-10-16 – 2022-10-21 (×9): 650 mg via ORAL
  Filled 2022-10-15 (×9): qty 2

## 2022-10-15 MED ORDER — SODIUM CHLORIDE 0.9 % IV SOLN
2.0000 g | INTRAVENOUS | Status: AC
  Administered 2022-10-16 – 2022-10-21 (×6): 2 g via INTRAVENOUS
  Filled 2022-10-15 (×6): qty 20

## 2022-10-15 MED ORDER — ACETAMINOPHEN 650 MG RE SUPP: 650.0000 mg | Freq: Four times a day (QID) | RECTAL | Status: DC | PRN

## 2022-10-15 MED ORDER — ALBUTEROL SULFATE (2.5 MG/3ML) 0.083% IN NEBU
2.5000 mg | INHALATION_SOLUTION | Freq: Four times a day (QID) | RESPIRATORY_TRACT | Status: DC | PRN
  Administered 2022-10-16 – 2022-10-20 (×4): 2.5 mg via RESPIRATORY_TRACT
  Filled 2022-10-15 (×3): qty 3

## 2022-10-15 MED ORDER — ROSUVASTATIN CALCIUM 5 MG PO TABS
5.0000 mg | ORAL_TABLET | Freq: Every day | ORAL | Status: DC
  Administered 2022-10-16 – 2022-10-23 (×7): 5 mg via ORAL
  Filled 2022-10-15 (×7): qty 1

## 2022-10-15 MED ORDER — CLOPIDOGREL BISULFATE 75 MG PO TABS
75.0000 mg | ORAL_TABLET | Freq: Every day | ORAL | Status: DC
  Administered 2022-10-16 – 2022-10-23 (×7): 75 mg via ORAL
  Filled 2022-10-15 (×7): qty 1

## 2022-10-15 MED ORDER — LOSARTAN POTASSIUM 50 MG PO TABS
50.0000 mg | ORAL_TABLET | Freq: Every day | ORAL | Status: DC
  Administered 2022-10-16 – 2022-10-23 (×7): 50 mg via ORAL
  Filled 2022-10-15 (×7): qty 1

## 2022-10-15 MED ORDER — ONDANSETRON HCL 4 MG PO TABS: 4.0000 mg | ORAL_TABLET | Freq: Four times a day (QID) | ORAL | Status: DC | PRN

## 2022-10-15 MED ORDER — SODIUM CHLORIDE 0.9 % IV SOLN: INTRAVENOUS | Status: DC

## 2022-10-15 MED ORDER — LORATADINE 10 MG PO TABS: 10.0000 mg | ORAL_TABLET | Freq: Every day | ORAL | Status: DC | PRN

## 2022-10-15 MED ORDER — SERTRALINE HCL 50 MG PO TABS
100.0000 mg | ORAL_TABLET | Freq: Every day | ORAL | Status: DC
  Administered 2022-10-16 – 2022-10-23 (×7): 100 mg via ORAL
  Filled 2022-10-15 (×7): qty 2

## 2022-10-15 MED ORDER — ONDANSETRON HCL 4 MG/2ML IJ SOLN: 4.0000 mg | Freq: Four times a day (QID) | INTRAMUSCULAR | Status: DC | PRN

## 2022-10-15 MED ORDER — ACETAMINOPHEN 325 MG PO TABS
650.0000 mg | ORAL_TABLET | Freq: Once | ORAL | Status: AC
  Administered 2022-10-15: 650 mg via ORAL
  Filled 2022-10-15: qty 2

## 2022-10-15 MED ORDER — PANTOPRAZOLE SODIUM 40 MG PO TBEC
40.0000 mg | DELAYED_RELEASE_TABLET | Freq: Every day | ORAL | Status: DC
  Administered 2022-10-16 – 2022-10-23 (×7): 40 mg via ORAL
  Filled 2022-10-15 (×7): qty 1

## 2022-10-15 MED ORDER — MAGNESIUM HYDROXIDE 400 MG/5ML PO SUSP: 30.0000 mL | Freq: Every day | ORAL | Status: DC | PRN

## 2022-10-15 MED ORDER — ALPRAZOLAM 0.5 MG PO TABS
0.5000 mg | ORAL_TABLET | Freq: Two times a day (BID) | ORAL | Status: DC | PRN
  Administered 2022-10-15 – 2022-10-22 (×4): 0.5 mg via ORAL
  Filled 2022-10-15 (×5): qty 1

## 2022-10-15 MED ORDER — TRAZODONE HCL 50 MG PO TABS
25.0000 mg | ORAL_TABLET | Freq: Every evening | ORAL | Status: DC | PRN
  Administered 2022-10-20: 25 mg via ORAL
  Filled 2022-10-15: qty 1

## 2022-10-15 NOTE — ED Provider Notes (Signed)
[facility] Provider Note    Event Date/Time   First MD Initiated Contact with Patient 10/15/22 2106     (approximate)   History   Wound Infection   HPI  AEDAN BURFORD is a 72 y.o. female with history of peripheral artery disease, CAD, breast cancer, and GERD who presents with rash to bilateral feet and concern for infection.  The patient states that she has had a spotty rash on her lower legs for several weeks.  She saw her primary care provider for this and was started on a steroid.  However subsequently the spots turned into larger lesions on both feet, especially the left side.  They are painful.  She has no open ulcers or any pus drainage.  She denies any fever or chills.  However she states over the last several days the redness has increased.  Her home care nurse saw her today, had her doctor, over to take a look at it, and they felt that she needed to come to the hospital.   I reviewed the past medical records.  The patient's was recently admitted to the hospital service in August 2023 for sepsis due to pneumonia.  Per Dr. Lanae Crumbly note from today, the patient is concern for acute vascular obstruction and possible gangrenous changes.  Physical Exam   Triage Vital Signs: ED Triage Vitals [10/15/22 1441]  Encounter Vitals Group     BP (!) 156/76     Systolic BP Percentile      Diastolic BP Percentile      Pulse Rate 68     Resp 18     Temp 98 F (36.7 C)     Temp src      SpO2 95 %     Weight 190 lb (86.2 kg)     Height 5\' 4"  (1.626 m)     Head Circumference      Peak Flow      Pain Score 8     Pain Loc      Pain Education      Exclude from Growth Chart     Most recent vital signs: Vitals:   10/15/22 2120 10/15/22 2230  BP:  (!) 159/68  Pulse:  61  Resp:  17  Temp: 98.6 F (37 C)   SpO2:  94%     General: Awake, no distress.  CV:  Good peripheral perfusion.  Resp:  Normal effort.  Abd:  No distention.  Other:  Petechial rash to  bilateral lower legs.  Several areas of erythema, induration, and warmth to the bilateral feet over the dorsum of the toes and distal aspect of the foot, with the largest measuring approximately 5 cm on the dorsum of the left foot.  Cap refill less than 2 seconds bilaterally.  DP pulses are slightly weak bilaterally.  Intact sensation and motor to her to bilateral toes.   ED Results / Procedures / Treatments   Labs (all labs ordered are listed, but only abnormal results are displayed) Labs Reviewed  SEDIMENTATION RATE - Abnormal; Notable for the following components:      Result Value   Sed Rate 35 (*)    All other components within normal limits  CBC  BASIC METABOLIC PANEL  LACTIC ACID, PLASMA  C-REACTIVE PROTEIN  LACTIC ACID, PLASMA  HIV ANTIBODY (ROUTINE TESTING W REFLEX)  BASIC METABOLIC PANEL  CBC     EKG     RADIOLOGY  XR L/R feet: I independently  viewed and interpreted the images; there is no acute fracture or cortical disruption.  PROCEDURES:  Critical Care performed: No  Procedures   MEDICATIONS ORDERED IN ED: Medications  losartan (COZAAR) tablet 50 mg (has no administration in time range)  rosuvastatin (CRESTOR) tablet 5 mg (has no administration in time range)  ALPRAZolam (XANAX) tablet 0.5 mg (0.5 mg Oral Given 10/15/22 2319)  sertraline (ZOLOFT) tablet 100 mg (has no administration in time range)  pantoprazole (PROTONIX) EC tablet 40 mg (has no administration in time range)  albuterol (PROVENTIL) (2.5 MG/3ML) 0.083% nebulizer solution 2.5 mg (has no administration in time range)  clopidogrel (PLAVIX) tablet 75 mg (has no administration in time range)  loratadine (CLARITIN) tablet 10 mg (has no administration in time range)  cefTRIAXone (ROCEPHIN) 2 g in sodium chloride 0.9 % 100 mL IVPB (has no administration in time range)  enoxaparin (LOVENOX) injection 42.5 mg (42.5 mg Subcutaneous Given 10/15/22 2320)  0.9 %  sodium chloride infusion (has no  administration in time range)  acetaminophen (TYLENOL) tablet 650 mg (has no administration in time range)    Or  acetaminophen (TYLENOL) suppository 650 mg (has no administration in time range)  traZODone (DESYREL) tablet 25 mg (has no administration in time range)  magnesium hydroxide (MILK OF MAGNESIA) suspension 30 mL (has no administration in time range)  ondansetron (ZOFRAN) tablet 4 mg (has no administration in time range)    Or  ondansetron (ZOFRAN) injection 4 mg (has no administration in time range)  cefTRIAXone (ROCEPHIN) 1 g in sodium chloride 0.9 % 100 mL IVPB (0 g Intravenous Stopped 10/15/22 2236)  acetaminophen (TYLENOL) tablet 650 mg (650 mg Oral Given 10/15/22 2206)  cefTRIAXone (ROCEPHIN) 1 g in sodium chloride 0.9 % 100 mL IVPB (0 g Intravenous Stopped 10/16/22 0007)     IMPRESSION / MDM / ASSESSMENT AND PLAN / ED COURSE  I reviewed the triage vital signs and the nursing notes.  73 year old female with PMH as noted above presents with bilateral foot pain and areas of redness and induration which have spread over the last several days.  Her PMD sent her in due to concern for possible acute vascular obstruction and gangrene.  Differential diagnosis includes, but is not limited to, PAD, gangrene, cellulitis.  The patient has absent DP pulses but normal cap refill and sensation distally.  We will obtain labs, I will consult vascular surgery, and reassess.  Patient's presentation is most consistent with acute presentation with potential threat to life or bodily function.  The patient is on the cardiac monitor to evaluate for evidence of arrhythmia and/or significant heart rate changes.  ----------------------------------------- 10:57 PM on 10/15/2022 -----------------------------------------  Lab workup is overall unremarkable.  ESR is slightly elevated but there is no leukocytosis.  Lactate is normal.  I checked DP pulses with Doppler and they are both present.  I  consulted Dr. Wyn Quaker from the surgery who advises that based on the clinical presentation including the audible pulses on Doppler, normal cap refill, lack of numbness, and the nature of the lesions, there is no indication for further emergent vascular workup.  Overall presentation is more consistent with cellulitis.  I have ordered IV antibiotics.  The patient will need admission for further workup and management.  I consulted Dr. Arville Care from the hospitalist service; based on our discussion he agrees to evaluate the patient for admission.   FINAL CLINICAL IMPRESSION(S) / ED DIAGNOSES   Final diagnoses:  Cellulitis of lower extremity, unspecified laterality  Rx / DC Orders   ED Discharge Orders     None        Note:  This document was prepared using Dragon voice recognition software and may include unintentional dictation errors.    Dionne Bucy, MD 10/16/22 Rich Fuchs

## 2022-10-15 NOTE — Assessment & Plan Note (Signed)
- 

## 2022-10-15 NOTE — Assessment & Plan Note (Signed)
-   Patient admitted to medical bed for - Will continue antibiotic therapy with IV Rocephin. - Pain management with provided. - Warm compresses will be applied. - Will obtain wound Gram stain and culture. - Dr. Wyn Quaker will be consulted for the possibility of foot ischemia with associated peripheral vascular disease.

## 2022-10-15 NOTE — Assessment & Plan Note (Signed)
-   We will continue PPI therapy 

## 2022-10-15 NOTE — Assessment & Plan Note (Signed)
-  We will continue aspirin and Plavix as well as statin therapy.

## 2022-10-15 NOTE — Assessment & Plan Note (Signed)
-   We will continue statin therapy. 

## 2022-10-15 NOTE — H&P (Signed)
[facility]   PATIENT NAME: Joan [name]    MR#:  [id number]  DATE OF BIRTH:  July 25, 1950  DATE OF ADMISSION:  10/15/2022  PRIMARY CARE PHYSICIAN: Barbette Reichmann, MD   Patient is coming from: Home  REQUESTING/REFERRING PHYSICIAN: Miki Kins, MD  CHIEF COMPLAINT:   Chief Complaint  Patient presents with   Wound Infection    HISTORY OF PRESENT ILLNESS:  Joan [name] is a 72 y.o. Caucasian female with medical history significant for anxiety, osteoarthritis, depression, GERD, hepatitis C, hypertension and peripheral vascular disease, who presented to the emergency room with acute onset of bilateral feet pain more on the left than the right leg started with a rash in both feet that is concerning for infection and has been going on for several weeks.  She was seen by her PCP and was started on steroid therapy.  She stated that the spots started to turn into larger lesions especially on the left side with pain.  She had open ulcers with purulent drainage.  She denied any fever or chills.  No nausea or vomiting or abdominal pain.  No chest pain or palpitations.  No cough or wheezing or dyspnea.  ED Course: Upon presentation to the emergency room, BP was 156/76 with otherwise normal vital signs.  Labs revealed unremarkable BMP and CBC.  Lactic acid was 1.7.  Sed rate is 35. EKG as reviewed by me : None Imaging: 2 view left and right feet x-ray revealed soft tissue swelling without evidence for osteomyelitis.  The patient was given 50 Mg of P.O. Tylenol and 1 G of IV Rocephin.  Pulses were audible with Doppler.  Dr. Wyn Quaker was notified about the patient.  She will be admitted to a medical bed for further evaluation and management.   PAST MEDICAL HISTORY:   Past Medical History:  Diagnosis Date   Angina at rest    Anxiety    Arthritis    Breast cancer, right breast (HCC)    s/p lumpectomy and adjuvant XRT   Bronchitis    Cancer (HCC)    Claudication, intermittent (HCC)     Depression    Dyspnea    Environmental and seasonal allergies    GERD (gastroesophageal reflux disease)    with spicy food   Hepatitis C antibody test positive    Hypertension    Peripheral vascular disease (HCC)    Personal history of radiation therapy 2002   RIGHT lumpectomy   TIA (transient ischemic attack)    no residual. Many years ago    PAST SURGICAL HISTORY:   Past Surgical History:  Procedure Laterality Date   APPENDECTOMY     BREAST EXCISIONAL BIOPSY Right    11/1999   BREAST LUMPECTOMY Right 2002   RIGHT lumpectomy w/ radiation 2002   BREAST SURGERY     CHOLECYSTECTOMY     ENDARTERECTOMY FEMORAL Bilateral 06/22/2020   Procedure: ENDARTERECTOMY FEMORAL;  Surgeon: Annice Needy, MD;  Location: [facility] OR[facility];  Service: Vascular;  Laterality: Bilateral;   INSERTION OF ILIAC STENT Bilateral 06/22/2020   Procedure: INSERTION OF ILIAC STENT;  Surgeon: Annice Needy, MD;  Location: [facility];  Service: Vascular;  Laterality: Bilateral;   LOWER EXTREMITY ANGIOGRAPHY Left 11/14/2017   Procedure: LOWER EXTREMITY ANGIOGRAPHY;  Surgeon: Annice Needy, MD;  Location: [facility];  Service: Cardiovascular;  Laterality: Left;   LOWER EXTREMITY ANGIOGRAPHY Right 04/21/2020   Procedure: LOWER EXTREMITY ANGIOGRAPHY;  Surgeon: Annice Needy, MD;  Location: [facility];  Service: Cardiovascular;  Laterality: Right;   LOWER EXTREMITY ANGIOGRAPHY Left 08/15/2020   Procedure: LOWER EXTREMITY ANGIOGRAPHY;  Surgeon: Annice Needy, MD;  Location: [facility];  Service: Cardiovascular;  Laterality: Left;   LOWER EXTREMITY ANGIOGRAPHY Right 08/22/2020   Procedure: LOWER EXTREMITY ANGIOGRAPHY;  Surgeon: Annice Needy, MD;  Location: [facility];  Service: Cardiovascular;  Laterality: Right;    SOCIAL HISTORY:   Social History   Tobacco Use   Smoking status: Former    Current packs/day: 0.50    Average packs/day: 0.5 packs/day for 50.0 years (25.0 ttl pk-yrs)    Types:  Cigarettes   Smokeless tobacco: Never   Tobacco comments:    trying to quit on her own  Substance Use Topics   Alcohol use: Not Currently    Comment: Beer heavy everyday in past - last 10 years ago    FAMILY HISTORY:   Family History  Problem Relation Age of Onset   Breast cancer Mother 53   Breast cancer Maternal Aunt    Breast cancer Maternal Grandmother    Breast cancer Sister 28    DRUG ALLERGIES:   Allergies  Allergen Reactions   Seasonal Ic [Cholestatin]     Seasonal allergies    REVIEW OF SYSTEMS:   ROS As per history of present illness. All pertinent systems were reviewed above. Constitutional, HEENT, cardiovascular, respiratory, GI, GU, musculoskeletal, neuro, psychiatric, endocrine, integumentary and hematologic systems were reviewed and are otherwise negative/unremarkable except for positive findings mentioned above in the HPI.   MEDICATIONS AT HOME:   Prior to Admission medications   Medication Sig Start Date End Date Taking? Authorizing Provider  acetaminophen (TYLENOL) 325 MG tablet Take 1-2 tablets (325-650 mg total) by mouth every 4 (four) hours as needed for mild pain (or temp >/= 101 F). 06/25/20   Louisa Second, MD  albuterol (VENTOLIN HFA) 108 (90 Base) MCG/ACT inhaler Inhale 2 puffs into the lungs every 6 (six) hours as needed for wheezing or shortness of breath. 09/19/21   Georgeann Oppenheim, Jonelle Sports, MD  ALPRAZolam Prudy Feeler) 0.5 MG tablet Take 0.5 mg by mouth 2 (two) times daily as needed for anxiety. 09/18/17   [provider]  aspirin EC 81 MG tablet Take 81 mg by mouth daily.    [provider]  clopidogrel (PLAVIX) 75 MG tablet Take 1 tablet (75 mg total) by mouth daily. 06/13/19   Georgiana Spinner, NP  loratadine (CLARITIN) 10 MG tablet Take 10 mg by mouth daily as needed for allergies.    [provider]  losartan (COZAAR) 50 MG tablet Take by mouth. 02/11/21   [provider]  pantoprazole (PROTONIX) 40 MG tablet Take by  mouth. 03/24/21 03/24/22  [provider]  rosuvastatin (CRESTOR) 5 MG tablet Take 5 mg by mouth daily. 05/15/22   [provider]  sertraline (ZOLOFT) 100 MG tablet Take 100 mg by mouth daily. 10/16/17   [provider]      VITAL SIGNS:  Blood pressure (!) 159/68, pulse 61, temperature 98.6 F (37 C), temperature source Oral, resp. rate 17, height 5\' 4"  (1.626 m), weight 86.2 kg, SpO2 94%.  PHYSICAL EXAMINATION:  Physical Exam  GENERAL:  72 y.o.-year-old patient lying in the bed with no acute distress.  EYES: Pupils equal, round, reactive to light and accommodation. No scleral icterus. Extraocular muscles intact.  HEENT: Head atraumatic, normocephalic. Oropharynx and nasopharynx clear.  NECK:  Supple, no  jugular venous distention. No thyroid enlargement, no tenderness.  LUNGS: Normal breath sounds bilaterally, no wheezing, rales,rhonchi or crepitation. No use of accessory muscles of respiration.  CARDIOVASCULAR: Regular rate and rhythm, S1, S2 normal. No murmurs, rubs, or gallops.  ABDOMEN: Soft, nondistended, nontender. Bowel sounds present. No organomegaly or mass.  EXTREMITIES: No pedal edema, cyanosis, or clubbing.  NEUROLOGIC: Cranial nerves II through XII are intact. Muscle strength 5/5 in all extremities. Sensation intact. Gait not checked.  PSYCHIATRIC: The patient is alert and oriented x 3.  Normal affect and good eye contact. SKIN: No obvious rash, lesion, or ulcer.   LABORATORY PANEL:   CBC Recent Labs  Lab 10/15/22 1443  WBC 9.5  HGB 13.9  HCT 44.2  PLT 282   ------------------------------------------------------------------------------------------------------------------  Chemistries  Recent Labs  Lab 10/15/22 1443  NA 139  K 4.1  CL 102  CO2 27  GLUCOSE 86  BUN 9  CREATININE 0.65  CALCIUM 9.4   ------------------------------------------------------------------------------------------------------------------  Cardiac Enzymes No  results for input(s): "TROPONINI" in the last 168 hours. ------------------------------------------------------------------------------------------------------------------  RADIOLOGY:  No results found.    IMPRESSION AND PLAN:  Assessment and Plan: * Cellulitis of foot - Patient admitted to medical bed for - Will continue antibiotic therapy with IV Rocephin. - Pain management with provided. - Warm compresses will be applied. - Will obtain wound Gram stain and culture. - Dr. Wyn Quaker will be consulted for the possibility of foot ischemia with associated peripheral vascular disease.  PVD (peripheral vascular disease) (HCC) - We will continue aspirin and Plavix as well as statin therapy.  Dyslipidemia - We will continue statin therapy.  GERD without esophagitis - We will continue PPI therapy.  Essential hypertension - We will continue antihypertensive therapy.   DVT prophylaxis: Lovenox.  Advanced Care Planning:  Code Status: full code.  Family Communication:  The plan of care was discussed in details with the patient (and family). I answered all questions. The patient agreed to proceed with the above mentioned plan. Further management will depend upon hospital course. Disposition Plan: Back to previous home environment Consults called: Vascular surgery. All the records are reviewed and case discussed with ED provider.  Status is: Inpatient  At the time of the admission, it appears that the appropriate admission status for this patient is inpatient.  This is judged to be reasonable and necessary in order to provide the required intensity of service to ensure the patient's safety given the presenting symptoms, physical exam findings and initial radiographic and laboratory data in the context of comorbid conditions.  The patient requires inpatient status due to high intensity of service, high risk of further deterioration and high frequency of surveillance required.  I certify that  at the time of admission, it is my clinical judgment that the patient will require inpatient hospital care extending more than 2 midnights.                            Dispo: The patient is from: Home              Anticipated d/c is to: Home              Patient currently is not medically stable to d/c.              Difficult to place patient: No  Hannah Beat M.D on 10/15/2022 at 11:09 PM  [facility]   From 7 PM-7 AM, contact night-coverage  [email or web address]  CC: Primary care physician; Barbette Reichmann, MD

## 2022-10-15 NOTE — ED Triage Notes (Signed)
Pt to ED for possible wound infection x1 week to both feet. States was referred to come here by PCP.

## 2022-10-15 NOTE — Progress Notes (Signed)
Anticoagulation monitoring(Lovenox):  72 yo female ordered Lovenox 40 mg Q24h    Filed Weights   10/15/22 1441  Weight: 86.2 kg (190 lb)   BMI 32.6    Lab Results  Component Value Date   CREATININE 0.65 10/15/2022   CREATININE 0.80 06/05/2022   CREATININE 0.69 09/18/2021   Estimated Creatinine Clearance: 69.5 mL/min (by C-G formula based on SCr of 0.65 mg/dL). Hemoglobin & Hematocrit     Component Value Date/Time   HGB 13.9 10/15/2022 1443   HCT 44.2 10/15/2022 1443     Per Protocol for Patient with estCrcl > 30 ml/min and BMI > 30, will transition to Lovenox 42.5 mg Q24h.

## 2022-10-16 DIAGNOSIS — L03119 Cellulitis of unspecified part of limb: Secondary | ICD-10-CM | POA: Diagnosis not present

## 2022-10-16 DIAGNOSIS — J441 Chronic obstructive pulmonary disease with (acute) exacerbation: Secondary | ICD-10-CM

## 2022-10-16 DIAGNOSIS — I739 Peripheral vascular disease, unspecified: Secondary | ICD-10-CM | POA: Diagnosis not present

## 2022-10-16 LAB — BASIC METABOLIC PANEL
Anion gap: 10 (ref 5–15)
BUN: 11 mg/dL (ref 8–23)
CO2: 27 mmol/L (ref 22–32)
Calcium: 8.8 mg/dL — ABNORMAL LOW (ref 8.9–10.3)
Chloride: 101 mmol/L (ref 98–111)
Creatinine, Ser: 0.8 mg/dL (ref 0.44–1.00)
GFR, Estimated: >60 mL/min (ref >60)
Glucose, Bld: 113 mg/dL — ABNORMAL HIGH (ref 70–99)
Potassium: 4.1 mmol/L (ref 3.5–5.1)
Sodium: 138 mmol/L (ref 135–145)

## 2022-10-16 LAB — CBC
HCT: 40.1 % (ref 36.0–46.0)
Hemoglobin: 12.8 g/dL (ref 12.0–15.0)
MCH: 28.9 pg (ref 26.0–34.0)
MCHC: 31.9 g/dL (ref 30.0–36.0)
MCV: 90.5 fL (ref 80.0–100.0)
Platelets: 263 10*3/uL (ref 150–400)
RBC: 4.43 MIL/uL (ref 3.87–5.11)
RDW: 13.8 % (ref 11.5–15.5)
WBC: 11 10*3/uL — ABNORMAL HIGH (ref 4.0–10.5)
nRBC: 0 % (ref 0.0–0.2)

## 2022-10-16 LAB — HIV ANTIBODY (ROUTINE TESTING W REFLEX): HIV Screen 4th Generation wRfx: NONREACTIVE

## 2022-10-16 LAB — LACTIC ACID, PLASMA: Lactic Acid, Venous: 1.1 mmol/L (ref 0.5–1.9)

## 2022-10-16 LAB — C-REACTIVE PROTEIN: CRP: 1.8 mg/dL — ABNORMAL HIGH (ref <1.0)

## 2022-10-16 MED ORDER — ASPIRIN 81 MG PO TBEC
81.0000 mg | DELAYED_RELEASE_TABLET | Freq: Every day | ORAL | Status: DC
  Administered 2022-10-16 – 2022-10-23 (×7): 81 mg via ORAL
  Filled 2022-10-16 (×7): qty 1

## 2022-10-16 MED ORDER — METOPROLOL SUCCINATE ER 25 MG PO TB24
25.0000 mg | ORAL_TABLET | Freq: Every day | ORAL | Status: DC
  Administered 2022-10-16 – 2022-10-23 (×7): 25 mg via ORAL
  Filled 2022-10-16 (×7): qty 1

## 2022-10-16 NOTE — Consult Note (Signed)
Hospital Consult    Reason for Consult:  Gangrene of bilateral lower extremities.  Requesting Physician:  Dr Sunnie Nielsen MD  MRN #:  [id number]  History of Present Illness: This is a 72 y.o. female  with medical history significant for anxiety, osteoarthritis, depression, GERD, hepatitis C, COPD, hypertension and peripheral vascular disease, who presented to the emergency room with acute onset of bilateral feet pain more on the left than the right leg started with a rash in both feet that is concerning for infection and has been going on for several weeks. PCP had tx steroids, rash worsened to open ulcers, also experiencing cough/wheezing. Vascular Surgery consulted to evaluate for possible procedure.    Past Medical History:  Diagnosis Date   Angina at rest    Anxiety    Arthritis    Breast cancer, right breast (HCC)    s/p lumpectomy and adjuvant XRT   Bronchitis    Cancer (HCC)    Claudication, intermittent (HCC)    Depression    Dyspnea    Environmental and seasonal allergies    GERD (gastroesophageal reflux disease)    with spicy food   Hepatitis C antibody test positive    Hypertension    Peripheral vascular disease (HCC)    Personal history of radiation therapy 2002   RIGHT lumpectomy   TIA (transient ischemic attack)    no residual. Many years ago    Past Surgical History:  Procedure Laterality Date   APPENDECTOMY     BREAST EXCISIONAL BIOPSY Right    11/1999   BREAST LUMPECTOMY Right 2002   RIGHT lumpectomy w/ radiation 2002   BREAST SURGERY     CHOLECYSTECTOMY     ENDARTERECTOMY FEMORAL Bilateral 06/22/2020   Procedure: ENDARTERECTOMY FEMORAL;  Surgeon: Annice Needy, MD;  Location: [facility];  Service: Vascular;  Laterality: Bilateral;   INSERTION OF ILIAC STENT Bilateral 06/22/2020   Procedure: INSERTION OF ILIAC STENT;  Surgeon: Annice Needy, MD;  Location: [facility];  Service: Vascular;  Laterality: Bilateral;   LOWER EXTREMITY ANGIOGRAPHY Left 11/14/2017    Procedure: LOWER EXTREMITY ANGIOGRAPHY;  Surgeon: Annice Needy, MD;  Location: [facility];  Service: Cardiovascular;  Laterality: Left;   LOWER EXTREMITY ANGIOGRAPHY Right 04/21/2020   Procedure: LOWER EXTREMITY ANGIOGRAPHY;  Surgeon: Annice Needy, MD;  Location: [facility];  Service: Cardiovascular;  Laterality: Right;   LOWER EXTREMITY ANGIOGRAPHY Left 08/15/2020   Procedure: LOWER EXTREMITY ANGIOGRAPHY;  Surgeon: Annice Needy, MD;  Location: [facility];  Service: Cardiovascular;  Laterality: Left;   LOWER EXTREMITY ANGIOGRAPHY Right 08/22/2020   Procedure: LOWER EXTREMITY ANGIOGRAPHY;  Surgeon: Annice Needy, MD;  Location: [facility];  Service: Cardiovascular;  Laterality: Right;    Allergies  Allergen Reactions   Seasonal Ic [Cholestatin]     Seasonal allergies    Prior to Admission medications   Medication Sig Start Date End Date Taking? Authorizing Provider  acetaminophen (TYLENOL) 325 MG tablet Take 1-2 tablets (325-650 mg total) by mouth every 4 (four) hours as needed for mild pain (or temp >/= 101 F). 06/25/20   Louisa Second, MD  albuterol (VENTOLIN HFA) 108 (90 Base) MCG/ACT inhaler Inhale 2 puffs into the lungs every 6 (six) hours as needed for wheezing or shortness of breath. 09/19/21   Georgeann Oppenheim, Jonelle Sports, MD  ALPRAZolam Prudy Feeler) 0.5 MG tablet Take 0.5 mg by mouth 2 (two) times daily as needed for anxiety. 09/18/17  [provider]  aspirin EC 81 MG tablet Take 81 mg by mouth daily.    [provider]  clopidogrel (PLAVIX) 75 MG tablet Take 1 tablet (75 mg total) by mouth daily. 06/13/19   Georgiana Spinner, NP  loratadine (CLARITIN) 10 MG tablet Take 10 mg by mouth daily as needed for allergies.    [provider]  losartan (COZAAR) 50 MG tablet Take by mouth. 02/11/21   [provider]  pantoprazole (PROTONIX) 40 MG tablet Take by mouth. 03/24/21 03/24/22  [provider]  rosuvastatin (CRESTOR) 5 MG  tablet Take 5 mg by mouth daily. 05/15/22   [provider]  sertraline (ZOLOFT) 100 MG tablet Take 100 mg by mouth daily. 10/16/17   [provider]    Social History   Socioeconomic History   Marital status: Single    Spouse name: Not on file   Number of children: Not on file   Years of education: Not on file   Highest education level: Not on file  Occupational History   Occupation: build harnesses for boats , large items  Tobacco Use   Smoking status: Former    Current packs/day: 0.50    Average packs/day: 0.5 packs/day for 50.0 years (25.0 ttl pk-yrs)    Types: Cigarettes   Smokeless tobacco: Never   Tobacco comments:    trying to quit on her own  Vaping Use   Vaping status: Never Used  Substance and Sexual Activity   Alcohol use: Not Currently    Comment: Beer heavy everyday in past - last 10 years ago   Drug use: No   Sexual activity: Not on file  Other Topics Concern   Not on file  Social History Narrative   Patient lives with her partner (handicapped). Home care to help partner out.   Partner is bedridden.    Patient has people to help her out once home.   Social Determinants of Health   Financial Resource Strain: Low Risk  (08/08/2022)   Received from [facility]   Overall Financial Resource Strain (CARDIA)    Difficulty of Paying Living Expenses: Not hard at all  Food Insecurity: No Food Insecurity (10/16/2022)   Hunger Vital Sign    Worried About Running Out of Food in the Last Year: Never true    Ran Out of Food in the Last Year: Never true  Transportation Needs: No Transportation Needs (10/16/2022)   PRAPARE - Administrator, Civil Service (Medical): No    Lack of Transportation (Non-Medical): No  Physical Activity: Not on file  Stress: Not on file  Social Connections: Not on file  Intimate Partner Violence: Not At Risk (10/16/2022)   Humiliation, Afraid, Rape, and Kick questionnaire    Fear of Current or  Ex-Partner: No    Emotionally Abused: No    Physically Abused: No    Sexually Abused: No     Family History  Problem Relation Age of Onset   Breast cancer Mother 36   Breast cancer Maternal Aunt    Breast cancer Maternal Grandmother    Breast cancer Sister 53    ROS: Otherwise negative unless mentioned in HPI  Physical Examination  Vitals:   10/16/22 0044 10/16/22 0810  BP: (!) 148/76 (!) 194/74  Pulse: 82 94  Resp: 20 18  Temp: 97.7 F (36.5 C) 97.8 F (36.6 C)  SpO2: 93% 92%   Body mass index is 32.61 kg/m.  General:  WDWN in NAD Gait: Not observed HENT: WNL, normocephalic Pulmonary: normal non-labored breathing, without Rales, rhonchi,  wheezing Cardiac: regular, without  Murmurs, rubs or gallops; without carotid bruits Abdomen: Positive bowel Sounds, soft, NT/ND, no masses Skin: with rashes Vascular Exam/Pulses: Palpable bilateral lower extremities  Extremities: with ischemic changes, with Gangrene , with cellulitis; without open wounds;  Musculoskeletal: no muscle wasting or atrophy  Neurologic: A&O X 3;  No focal weakness or paresthesias are detected; speech is fluent/normal Psychiatric:  The pt has Normal affect. Lymph:  Unremarkable  CBC    Component Value Date/Time   WBC 11.0 (H) 10/16/2022 0152   RBC 4.43 10/16/2022 0152   HGB 12.8 10/16/2022 0152   HCT 40.1 10/16/2022 0152   PLT 263 10/16/2022 0152   MCV 90.5 10/16/2022 0152   MCH 28.9 10/16/2022 0152   MCHC 31.9 10/16/2022 0152   RDW 13.8 10/16/2022 0152   LYMPHSABS 2.5 09/18/2021 0807   MONOABS 0.7 09/18/2021 0807   EOSABS 0.5 09/18/2021 0807   BASOSABS 0.1 09/18/2021 0807    BMET    Component Value Date/Time   NA 138 10/16/2022 0152   K 4.1 10/16/2022 0152   CL 101 10/16/2022 0152   CO2 27 10/16/2022 0152   GLUCOSE 113 (H) 10/16/2022 0152   BUN 11 10/16/2022 0152   CREATININE 0.80 10/16/2022 0152   CALCIUM 8.8 (L) 10/16/2022 0152   GFRNONAA >60 10/16/2022 0152   GFRAA >60  11/12/2017 0948    COAGS: Lab Results  Component Value Date   INR 1.2 09/15/2021   INR 1.0 06/21/2020   INR 0.98 07/14/2013     Non-Invasive Vascular Imaging:   LOWER EXTREMITY DOPPLER STUDY   Patient Name:  CHRISTIA MONJE  Date of Exam:   05/21/2022  Medical Rec #: [id number]       Accession #:    [id number]  Date of Birth: [birth year]       Patient Gender: F  Patient Age:   22 years  Exam Location:  [facility]  Procedure:      VAS [place] ABI WITH/WO TBI  Referring Phys:    ---------------------------------------------------------------------------  -----    Indications: Peripheral artery disease.     Vascular Interventions: 08/15/2020 PTA of Lt SFA and popliteal artery. PTA  of Lt                          distal SFA and tibioperoneal trunk. Stent Lt SFA  and                         popliteal. 08/22/2020 PTA of Rt profunda artery,  ATA, SFA                          and popliteal artery.   Performing Technologist: Salvadore Farber RVT     Examination Guidelines: A complete evaluation includes at minimum, Doppler  waveform signals and systolic blood pressure reading at the level of  bilateral  brachial, anterior tibial, and posterior tibial arteries, when vessel  segments  are accessible. Bilateral testing is considered an integral part of a  complete  examination. Photoelectric Plethysmograph (PPG) waveforms and toe systolic  pressure readings are included as required and additional duplex testing  as  needed. Limited examinations for reoccurring indications may be performed  as  noted.     ABI Findings:  +---------+------------------+-----+--------+--------+  Right  Rt Pressure (mmHg)IndexWaveformComment   +---------+------------------+-----+--------+--------+  Brachial 138                                      +---------+------------------+-----+--------+--------+  ATA     98                0.64 biphasic           +---------+------------------+-----+--------+--------+  PTA     138               0.90 biphasic          +---------+------------------+-----+--------+--------+  Great Toe112               0.73 Normal            +---------+------------------+-----+--------+--------+   +---------+------------------+-----+--------+-------+  Left    Lt Pressure (mmHg)IndexWaveformComment  +---------+------------------+-----+--------+-------+  Brachial 153                                     +---------+------------------+-----+--------+-------+  ATA     100               0.65                  +---------+------------------+-----+--------+-------+  PTA     98                0.64                  +---------+------------------+-----+--------+-------+  Great Toe99                0.65 Normal           +---------+------------------+-----+--------+-------+   +-------+-----------+-----------+------------+------------+  ABI/TBIToday's ABIToday's TBIPrevious ABIPrevious TBI  +-------+-----------+-----------+------------+------------+  Right .90        .73        .94         .75           +-------+-----------+-----------+------------+------------+  Left  .65        .65        .63         .45           +-------+-----------+-----------+------------+------------+       Bilateral ABIs appear essentially unchanged compared to prior study on  09/01/2021.    Summary:  Right: Resting right ankle-brachial index indicates mild right lower  extremity arterial disease. The right toe-brachial index is normal.   Left: Resting left ankle-brachial index indicates moderate left lower  extremity arterial disease. The left toe-brachial index is abnormal.    Statin:  Yes.   Beta Blocker:  Yes.   Aspirin:  Yes.   ACEI:  No. ARB:  Yes.   CCB use:  No Other antiplatelets/anticoagulants:  Yes.   Plavix 75 mg Daily    ASSESSMENT/PLAN: This is a 71 y.o. female who  presents to [facility] ER with acute onset bilateral lower extremity foot pain that started a couple of weeks ago. Patient endorses left foot pain is greater than right. Open sores started as a rash in which she was placed on steroids as treatment and she endorses her lower extremities worsened after she had finished the course of steroids. Looking at her lower extremities it appears she may have multiple septic emboli in the setting of vascular ischemia.  Therefore taking the patient to  the vascular lab for a left lower extremity angiogram with possible intervention to maximize blood flow in the hope it would be able to heal her current ulcerations.  Vascular surgery plans on taking the patient to the vascular lab tomorrow 10/17/2022 for a left lower extremity angiogram with possible intervention.  Patient's last ultrasounds with ABIs were done on 09/13/2021.  At that point in time her left lower extremity ABI was 0.65 with monophasic pulses. Today I discussed in detail with the patient the procedure, benefits, risks, and complications.  Patient verbalizes her understanding and wishes to proceed.  I answered all the patient's questions.  Patient will be made n.p.o. after midnight for procedure tomorrow.    -I discussed in detail the plan with Dr. Festus Barren MD and he is in agreement with the plan.   Marcie [facility] 10/16/2022 2:14 PM

## 2022-10-16 NOTE — Progress Notes (Addendum)
PROGRESS NOTE    Joan [name]   BJY:[id number] DOB: 11-20-1950  DOA: 10/15/2022 Date of Service: 10/16/22 which is hospital day 1  PCP: Barbette Reichmann, MD    HPI: Joan [name] is a 72 y.o. Caucasian female with medical history significant for anxiety, osteoarthritis, depression, GERD, hepatitis C, COPD, hypertension and peripheral vascular disease, who presented to the emergency room with acute onset of bilateral feet pain more on the left than the right leg started with a rash in both feet that is concerning for infection and has been going on for several weeks. PCP had tx steroids, rash worsened to open ulcers, also experiencing cough/wheezing  Last seen in office by vascular surgery 05/21/22 - atherosclerosis w/ intermittent claudication bilateral LE, no invasive studies planned. Hx multiple angio/stent LE 10/2017 - 08/2020, with L femoral endarterectomy 06/22/2020   Hospital course / significant events:  09/23: to ED from home, VSS, unremarkable BMP and CBC. Lactic acid was 1.7. ESR 35, XR 2 view left and right feet x-ray revealed soft tissue swelling without evidence for osteomyelitis. Started ceftriaxone. Pulses audible with Doppler.  09/24: vascular surgery recs - plan LLE angio w/ possible intervention for tomrorow   Consultants:  Vascular surgery   Procedures/Surgeries: none      ASSESSMENT & PLAN:   Septic emboli w/ cellulitis of foot, in setting of vascular ischemia IV abx w/ ceftriaxone Pain management Wound gram stain / culture  Vascular surgery plan LLE angio w/ possible intervention for tomrorow   PVD (peripheral vascular disease) (HCC) ASA, plavix, statin   COPD with acute exacerbation (HCC) scheduled and as needed DuoNebs for now. mucolytic therapy. hold off steroid therapy given her cellulitis, and she is maintaining SoP2 on RA.   GERD without esophagitis PPI   Dyslipidemia Statin   Essential hypertension Continue home losartan  Added  metoprolol d/t BP high  Adjust as needed   Anxiety Home Sertaline scheduled  Alprazolam prn     Obesity based on BMI: Body mass index is 32.61 kg/m.   DVT prophylaxis: lovenox  IV fluids: havs d/c continuous IV fluids  Nutrition: regular Central lines / invasive devices: none  Code Status: DNR per H&P ACP documentation reviewed: 10/16/22 and none on file in [facility]  TOC needs: TBD Barriers to dispo / significant pending items: expect home when stable, pending clinical improvement and angio tomorrow, expect will be here couple more days              Subjective / Brief ROS:  Patient reports pain in feet but not too bad right now  Denies CP/SOB.  Pain controlled.  Denies new weakness.  Tolerating diet.  Reports no concerns w/ urination/defecation.   Family Communication: none at this time     Objective Findings:  Vitals:   10/15/22 2230 10/16/22 0044 10/16/22 0810 10/16/22 1516  BP: (!) 159/68 (!) 148/76 (!) 194/74 (!) 174/80  Pulse: 61 82 94 72  Resp: 17 20 18 16   Temp:  97.7 F (36.5 C) 97.8 F (36.6 C) 97.7 F (36.5 C)  TempSrc:  Oral    SpO2: 94% 93% 92% 97%  Weight:      Height:        Intake/Output Summary (Last 24 hours) at 10/16/2022 1700 Last data filed at 10/16/2022 1000 Gross per 24 hour  Intake 501.16 ml  Output --  Net 501.16 ml   Filed Weights   10/15/22 1441  Weight: 86.2 kg    Examination:  Physical  Exam Constitutional:      General: She is not in acute distress.    Appearance: She is not toxic-appearing.  Cardiovascular:     Rate and Rhythm: Normal rate and regular rhythm.  Pulmonary:     Effort: Pulmonary effort is normal.     Breath sounds: Normal breath sounds.  Musculoskeletal:        General: Tenderness (L foot diffusely) present.     Right lower leg: No edema.     Left lower leg: No edema.     Comments: See photo  Neurological:     General: No focal deficit present.     Mental Status: She is alert and oriented  to person, place, and time.  Psychiatric:        Mood and Affect: Mood normal.        Behavior: Behavior normal.          Scheduled Medications:   aspirin EC  81 mg Oral Daily   clopidogrel  75 mg Oral Daily   enoxaparin (LOVENOX) injection  0.5 mg/kg Subcutaneous Q24H   losartan  50 mg Oral Daily   metoprolol succinate  25 mg Oral Daily   pantoprazole  40 mg Oral Daily   rosuvastatin  5 mg Oral Daily   sertraline  100 mg Oral Daily    Continuous Infusions:  cefTRIAXone (ROCEPHIN)  IV      PRN Medications:  acetaminophen **OR** acetaminophen, albuterol, ALPRAZolam, loratadine, magnesium hydroxide, ondansetron **OR** ondansetron (ZOFRAN) IV, traZODone  Antimicrobials from admission:  Anti-infectives (From admission, onward)    Start     Dose/Rate Route Frequency Ordered Stop   10/16/22 2200  cefTRIAXone (ROCEPHIN) 2 g in sodium chloride 0.9 % 100 mL IVPB        2 g 200 mL/hr over 30 Minutes Intravenous Every 24 hours 10/15/22 2255 10/22/22 2159   10/15/22 2315  cefTRIAXone (ROCEPHIN) 1 g in sodium chloride 0.9 % 100 mL IVPB        1 g 200 mL/hr over 30 Minutes Intravenous  Once 10/15/22 2301 10/16/22 0007   10/15/22 2130  cefTRIAXone (ROCEPHIN) 1 g in sodium chloride 0.9 % 100 mL IVPB        1 g 200 mL/hr over 30 Minutes Intravenous  Once 10/15/22 2124 10/15/22 2236           Data Reviewed:  I have personally reviewed the following...  CBC: Recent Labs  Lab 10/15/22 1443 10/16/22 0152  WBC 9.5 11.0*  HGB 13.9 12.8  HCT 44.2 40.1  MCV 91.1 90.5  PLT 282 263   Basic Metabolic Panel: Recent Labs  Lab 10/15/22 1443 10/16/22 0152  NA 139 138  K 4.1 4.1  CL 102 101  CO2 27 27  GLUCOSE 86 113*  BUN 9 11  CREATININE 0.65 0.80  CALCIUM 9.4 8.8*   GFR: Estimated Creatinine Clearance: 69.5 mL/min (by C-G formula based on SCr of 0.8 mg/dL). Liver Function Tests: No results for input(s): "AST", "ALT", "ALKPHOS", "BILITOT", "PROT", "ALBUMIN" in the  last 168 hours. No results for input(s): "LIPASE", "AMYLASE" in the last 168 hours. No results for input(s): "AMMONIA" in the last 168 hours. Coagulation Profile: No results for input(s): "INR", "PROTIME" in the last 168 hours. Cardiac Enzymes: No results for input(s): "CKTOTAL", "CKMB", "CKMBINDEX", "TROPONINI" in the last 168 hours. BNP (last 3 results) No results for input(s): "PROBNP" in the last 8760 hours. HbA1C: No results for input(s): "HGBA1C" in the last 72 hours.  CBG: No results for input(s): "GLUCAP" in the last 168 hours. Lipid Profile: No results for input(s): "CHOL", "HDL", "LDLCALC", "TRIG", "CHOLHDL", "LDLDIRECT" in the last 72 hours. Thyroid Function Tests: No results for input(s): "TSH", "T4TOTAL", "FREET4", "T3FREE", "THYROIDAB" in the last 72 hours. Anemia Panel: No results for input(s): "VITAMINB12", "FOLATE", "FERRITIN", "TIBC", "IRON", "RETICCTPCT" in the last 72 hours. Most Recent Urinalysis On File:     Component Value Date/Time   COLORURINE YELLOW 07/14/2013 1915   APPEARANCEUR Clear 05/25/2021 0857   LABSPEC 1.016 07/14/2013 1915   PHURINE 7.0 07/14/2013 1915   GLUCOSEU Negative 05/25/2021 0857   HGBUR MODERATE (A) 07/14/2013 1915   BILIRUBINUR Negative 05/25/2021 0857   KETONESUR 15 (A) 07/14/2013 1915   PROTEINUR Negative 05/25/2021 0857   PROTEINUR 100 (A) 07/14/2013 1915   UROBILINOGEN 1.0 07/14/2013 1915   NITRITE Negative 05/25/2021 0857   NITRITE NEGATIVE 07/14/2013 1915   LEUKOCYTESUR Negative 05/25/2021 0857   Sepsis Labs: @LABRCNTIP (procalcitonin:4,lacticidven:4) Microbiology: No results found for this or any previous visit (from the past 240 hour(s)).    Radiology Studies last 3 days: DG Foot 2 Views Left  Result Date: 10/15/2022 CLINICAL DATA:  Wound infection EXAM: LEFT FOOT - 2 VIEW COMPARISON:  None Available. FINDINGS: There is diffuse soft tissue swelling of the foot. There is no radiopaque foreign body. There is no acute  fracture, dislocation or cortical erosion. Plantar calcaneal spur is present. There is mild hallux valgus. IMPRESSION: 1. Diffuse soft tissue swelling of the foot. No acute osseous abnormality. 2. Mild hallux valgus. Electronically Signed   By: Darliss Cheney M.D.   On: 10/15/2022 23:35   DG Foot 2 Views Right  Result Date: 10/15/2022 CLINICAL DATA:  Wound infection. EXAM: RIGHT FOOT - 2 VIEW COMPARISON:  None Available. FINDINGS: There is soft tissue swelling over the dorsum of the foot. There is no foreign body. There is no acute fracture or dislocation identified. No cortical erosions are seen. IMPRESSION: Soft tissue swelling over the dorsum of the foot. No acute fracture or dislocation. Electronically Signed   By: Darliss Cheney M.D.   On: 10/15/2022 23:34       Sunnie Nielsen, DO [facility] 10/16/2022, 5:00 PM    Dictation software may have been used to generate the above note. Typos may occur and escape review in typed/dictated notes. Please contact Dr Lyn Hollingshead directly for clarity if needed.  Staff may message me via secure chat in Epic  but this may not receive an immediate response,  please page me for urgent matters!  If 7PM-7AM, please contact night coverage [email or web address]

## 2022-10-16 NOTE — H&P (View-Only) (Signed)
Hospital Consult    Reason for Consult:  Gangrene of bilateral lower extremities.  Requesting Physician:  Dr Sunnie Nielsen MD  MRN #:  [id number]  History of Present Illness: This is a 72 y.o. female  with medical history significant for anxiety, osteoarthritis, depression, GERD, hepatitis C, COPD, hypertension and peripheral vascular disease, who presented to the emergency room with acute onset of bilateral feet pain more on the left than the right leg started with a rash in both feet that is concerning for infection and has been going on for several weeks. PCP had tx steroids, rash worsened to open ulcers, also experiencing cough/wheezing. Vascular Surgery consulted to evaluate for possible procedure.    Past Medical History:  Diagnosis Date   Angina at rest    Anxiety    Arthritis    Breast cancer, right breast (HCC)    s/p lumpectomy and adjuvant XRT   Bronchitis    Cancer (HCC)    Claudication, intermittent (HCC)    Depression    Dyspnea    Environmental and seasonal allergies    GERD (gastroesophageal reflux disease)    with spicy food   Hepatitis C antibody test positive    Hypertension    Peripheral vascular disease (HCC)    Personal history of radiation therapy 2002   RIGHT lumpectomy   TIA (transient ischemic attack)    no residual. Many years ago    Past Surgical History:  Procedure Laterality Date   APPENDECTOMY     BREAST EXCISIONAL BIOPSY Right    11/1999   BREAST LUMPECTOMY Right 2002   RIGHT lumpectomy w/ radiation 2002   BREAST SURGERY     CHOLECYSTECTOMY     ENDARTERECTOMY FEMORAL Bilateral 06/22/2020   Procedure: ENDARTERECTOMY FEMORAL;  Surgeon: Annice Needy, MD;  Location: [facility];  Service: Vascular;  Laterality: Bilateral;   INSERTION OF ILIAC STENT Bilateral 06/22/2020   Procedure: INSERTION OF ILIAC STENT;  Surgeon: Annice Needy, MD;  Location: [facility];  Service: Vascular;  Laterality: Bilateral;   LOWER EXTREMITY ANGIOGRAPHY Left 11/14/2017    Procedure: LOWER EXTREMITY ANGIOGRAPHY;  Surgeon: Annice Needy, MD;  Location: [facility];  Service: Cardiovascular;  Laterality: Left;   LOWER EXTREMITY ANGIOGRAPHY Right 04/21/2020   Procedure: LOWER EXTREMITY ANGIOGRAPHY;  Surgeon: Annice Needy, MD;  Location: [facility];  Service: Cardiovascular;  Laterality: Right;   LOWER EXTREMITY ANGIOGRAPHY Left 08/15/2020   Procedure: LOWER EXTREMITY ANGIOGRAPHY;  Surgeon: Annice Needy, MD;  Location: [facility];  Service: Cardiovascular;  Laterality: Left;   LOWER EXTREMITY ANGIOGRAPHY Right 08/22/2020   Procedure: LOWER EXTREMITY ANGIOGRAPHY;  Surgeon: Annice Needy, MD;  Location: [facility];  Service: Cardiovascular;  Laterality: Right;    Allergies  Allergen Reactions   Seasonal Ic [Cholestatin]     Seasonal allergies    Prior to Admission medications   Medication Sig Start Date End Date Taking? Authorizing Provider  acetaminophen (TYLENOL) 325 MG tablet Take 1-2 tablets (325-650 mg total) by mouth every 4 (four) hours as needed for mild pain (or temp >/= 101 F). 06/25/20   Louisa Second, MD  albuterol (VENTOLIN HFA) 108 (90 Base) MCG/ACT inhaler Inhale 2 puffs into the lungs every 6 (six) hours as needed for wheezing or shortness of breath. 09/19/21   Georgeann Oppenheim, Jonelle Sports, MD  ALPRAZolam Prudy Feeler) 0.5 MG tablet Take 0.5 mg by mouth 2 (two) times daily as needed for anxiety. 09/18/17  [provider]  aspirin EC 81 MG tablet Take 81 mg by mouth daily.    [provider]  clopidogrel (PLAVIX) 75 MG tablet Take 1 tablet (75 mg total) by mouth daily. 06/13/19   Georgiana Spinner, NP  loratadine (CLARITIN) 10 MG tablet Take 10 mg by mouth daily as needed for allergies.    [provider]  losartan (COZAAR) 50 MG tablet Take by mouth. 02/11/21   [provider]  pantoprazole (PROTONIX) 40 MG tablet Take by mouth. 03/24/21 03/24/22  [provider]  rosuvastatin (CRESTOR) 5 MG  tablet Take 5 mg by mouth daily. 05/15/22   [provider]  sertraline (ZOLOFT) 100 MG tablet Take 100 mg by mouth daily. 10/16/17   [provider]    Social History   Socioeconomic History   Marital status: Single    Spouse name: Not on file   Number of children: Not on file   Years of education: Not on file   Highest education level: Not on file  Occupational History   Occupation: build harnesses for boats , large items  Tobacco Use   Smoking status: Former    Current packs/day: 0.50    Average packs/day: 0.5 packs/day for 50.0 years (25.0 ttl pk-yrs)    Types: Cigarettes   Smokeless tobacco: Never   Tobacco comments:    trying to quit on her own  Vaping Use   Vaping status: Never Used  Substance and Sexual Activity   Alcohol use: Not Currently    Comment: Beer heavy everyday in past - last 10 years ago   Drug use: No   Sexual activity: Not on file  Other Topics Concern   Not on file  Social History Narrative   Patient lives with her partner (handicapped). Home care to help partner out.   Partner is bedridden.    Patient has people to help her out once home.   Social Determinants of Health   Financial Resource Strain: Low Risk  (08/08/2022)   Received from [facility]   Overall Financial Resource Strain (CARDIA)    Difficulty of Paying Living Expenses: Not hard at all  Food Insecurity: No Food Insecurity (10/16/2022)   Hunger Vital Sign    Worried About Running Out of Food in the Last Year: Never true    Ran Out of Food in the Last Year: Never true  Transportation Needs: No Transportation Needs (10/16/2022)   PRAPARE - Administrator, Civil Service (Medical): No    Lack of Transportation (Non-Medical): No  Physical Activity: Not on file  Stress: Not on file  Social Connections: Not on file  Intimate Partner Violence: Not At Risk (10/16/2022)   Humiliation, Afraid, Rape, and Kick questionnaire    Fear of Current or  Ex-Partner: No    Emotionally Abused: No    Physically Abused: No    Sexually Abused: No     Family History  Problem Relation Age of Onset   Breast cancer Mother 36   Breast cancer Maternal Aunt    Breast cancer Maternal Grandmother    Breast cancer Sister 53    ROS: Otherwise negative unless mentioned in HPI  Physical Examination  Vitals:   10/16/22 0044 10/16/22 0810  BP: (!) 148/76 (!) 194/74  Pulse: 82 94  Resp: 20 18  Temp: 97.7 F (36.5 C) 97.8 F (36.6 C)  SpO2: 93% 92%   Body mass index is 32.61 kg/m.  General:  WDWN in NAD Gait: Not observed HENT: WNL, normocephalic Pulmonary: normal non-labored breathing, without Rales, rhonchi,  wheezing Cardiac: regular, without  Murmurs, rubs or gallops; without carotid bruits Abdomen: Positive bowel Sounds, soft, NT/ND, no masses Skin: with rashes Vascular Exam/Pulses: Palpable bilateral lower extremities  Extremities: with ischemic changes, with Gangrene , with cellulitis; without open wounds;  Musculoskeletal: no muscle wasting or atrophy  Neurologic: A&O X 3;  No focal weakness or paresthesias are detected; speech is fluent/normal Psychiatric:  The pt has Normal affect. Lymph:  Unremarkable  CBC    Component Value Date/Time   WBC 11.0 (H) 10/16/2022 0152   RBC 4.43 10/16/2022 0152   HGB 12.8 10/16/2022 0152   HCT 40.1 10/16/2022 0152   PLT 263 10/16/2022 0152   MCV 90.5 10/16/2022 0152   MCH 28.9 10/16/2022 0152   MCHC 31.9 10/16/2022 0152   RDW 13.8 10/16/2022 0152   LYMPHSABS 2.5 09/18/2021 0807   MONOABS 0.7 09/18/2021 0807   EOSABS 0.5 09/18/2021 0807   BASOSABS 0.1 09/18/2021 0807    BMET    Component Value Date/Time   NA 138 10/16/2022 0152   K 4.1 10/16/2022 0152   CL 101 10/16/2022 0152   CO2 27 10/16/2022 0152   GLUCOSE 113 (H) 10/16/2022 0152   BUN 11 10/16/2022 0152   CREATININE 0.80 10/16/2022 0152   CALCIUM 8.8 (L) 10/16/2022 0152   GFRNONAA >60 10/16/2022 0152   GFRAA >60  11/12/2017 0948    COAGS: Lab Results  Component Value Date   INR 1.2 09/15/2021   INR 1.0 06/21/2020   INR 0.98 07/14/2013     Non-Invasive Vascular Imaging:   LOWER EXTREMITY DOPPLER STUDY   Patient Name:  CHRISTIA MONJE  Date of Exam:   05/21/2022  Medical Rec #: [id number]       Accession #:    [id number]  Date of Birth: [birth year]       Patient Gender: F  Patient Age:   22 years  Exam Location:  [facility]  Procedure:      VAS [place] ABI WITH/WO TBI  Referring Phys:    ---------------------------------------------------------------------------  -----    Indications: Peripheral artery disease.     Vascular Interventions: 08/15/2020 PTA of Lt SFA and popliteal artery. PTA  of Lt                          distal SFA and tibioperoneal trunk. Stent Lt SFA  and                         popliteal. 08/22/2020 PTA of Rt profunda artery,  ATA, SFA                          and popliteal artery.   Performing Technologist: Salvadore Farber RVT     Examination Guidelines: A complete evaluation includes at minimum, Doppler  waveform signals and systolic blood pressure reading at the level of  bilateral  brachial, anterior tibial, and posterior tibial arteries, when vessel  segments  are accessible. Bilateral testing is considered an integral part of a  complete  examination. Photoelectric Plethysmograph (PPG) waveforms and toe systolic  pressure readings are included as required and additional duplex testing  as  needed. Limited examinations for reoccurring indications may be performed  as  noted.     ABI Findings:  +---------+------------------+-----+--------+--------+  Right  Rt Pressure (mmHg)IndexWaveformComment   +---------+------------------+-----+--------+--------+  Brachial 138                                      +---------+------------------+-----+--------+--------+  ATA     98                0.64 biphasic           +---------+------------------+-----+--------+--------+  PTA     138               0.90 biphasic          +---------+------------------+-----+--------+--------+  Great Toe112               0.73 Normal            +---------+------------------+-----+--------+--------+   +---------+------------------+-----+--------+-------+  Left    Lt Pressure (mmHg)IndexWaveformComment  +---------+------------------+-----+--------+-------+  Brachial 153                                     +---------+------------------+-----+--------+-------+  ATA     100               0.65                  +---------+------------------+-----+--------+-------+  PTA     98                0.64                  +---------+------------------+-----+--------+-------+  Great Toe99                0.65 Normal           +---------+------------------+-----+--------+-------+   +-------+-----------+-----------+------------+------------+  ABI/TBIToday's ABIToday's TBIPrevious ABIPrevious TBI  +-------+-----------+-----------+------------+------------+  Right .90        .73        .94         .75           +-------+-----------+-----------+------------+------------+  Left  .65        .65        .63         .45           +-------+-----------+-----------+------------+------------+       Bilateral ABIs appear essentially unchanged compared to prior study on  09/01/2021.    Summary:  Right: Resting right ankle-brachial index indicates mild right lower  extremity arterial disease. The right toe-brachial index is normal.   Left: Resting left ankle-brachial index indicates moderate left lower  extremity arterial disease. The left toe-brachial index is abnormal.    Statin:  Yes.   Beta Blocker:  Yes.   Aspirin:  Yes.   ACEI:  No. ARB:  Yes.   CCB use:  No Other antiplatelets/anticoagulants:  Yes.   Plavix 75 mg Daily    ASSESSMENT/PLAN: This is a 71 y.o. female who  presents to [facility] ER with acute onset bilateral lower extremity foot pain that started a couple of weeks ago. Patient endorses left foot pain is greater than right. Open sores started as a rash in which she was placed on steroids as treatment and she endorses her lower extremities worsened after she had finished the course of steroids. Looking at her lower extremities it appears she may have multiple septic emboli in the setting of vascular ischemia.  Therefore taking the patient to  the vascular lab for a left lower extremity angiogram with possible intervention to maximize blood flow in the hope it would be able to heal her current ulcerations.  Vascular surgery plans on taking the patient to the vascular lab tomorrow 10/17/2022 for a left lower extremity angiogram with possible intervention.  Patient's last ultrasounds with ABIs were done on 09/13/2021.  At that point in time her left lower extremity ABI was 0.65 with monophasic pulses. Today I discussed in detail with the patient the procedure, benefits, risks, and complications.  Patient verbalizes her understanding and wishes to proceed.  I answered all the patient's questions.  Patient will be made n.p.o. after midnight for procedure tomorrow.    -I discussed in detail the plan with Dr. Festus Barren MD and he is in agreement with the plan.   [facility] 10/16/2022 2:14 PM

## 2022-10-16 NOTE — Plan of Care (Signed)

## 2022-10-16 NOTE — Assessment & Plan Note (Signed)
-   We will place the patient on scheduled and as needed DuoNebs for now. - We will add mucolytic therapy. - I will hold off steroid therapy given her cellulitis.

## 2022-10-16 NOTE — Hospital Course (Addendum)
HPI: Joan [name] is a 72 y.o. Caucasian female with medical history significant for anxiety, osteoarthritis, depression, GERD, hepatitis C, COPD, hypertension and peripheral vascular disease, who presented to the emergency room with acute onset of bilateral feet pain more on the left than the right leg started with a rash in both feet that is concerning for infection and has been going on for several weeks. PCP had tx steroids, rash worsened to open ulcers, also experiencing cough/wheezing  Last seen in office by vascular surgery 05/21/22 - atherosclerosis w/ intermittent claudication bilateral LE, no invasive studies planned. Hx multiple angio/stent LE 10/2017 - 08/2020, with L femoral endarterectomy 06/22/2020   Hospital course / significant events:  09/23: to ED from home, VSS, unremarkable BMP and CBC. Lactic acid was 1.7. ESR 35, XR 2 view left and right feet x-ray revealed soft tissue swelling without evidence for osteomyelitis. Started ceftriaxone. Pulses audible with Doppler.  09/24: vascular surgery recs - plan LLE angio w/ possible intervention for tomrorow   Consultants:  Vascular surgery   Procedures/Surgeries: none      ASSESSMENT & PLAN:   Septic emboli w/ cellulitis of foot, in setting of vascular ischemia IV abx w/ ceftriaxone Pain management Wound gram stain / culture  Vascular surgery plan LLE angio w/ possible intervention for tomrorow   PVD (peripheral vascular disease) (HCC) ASA, plavix, statin   COPD with acute exacerbation (HCC) scheduled and as needed DuoNebs for now. mucolytic therapy. hold off steroid therapy given her cellulitis, and she is maintaining SoP2 on RA.   GERD without esophagitis PPI   Dyslipidemia Statin   Essential hypertension Continue home losartan  Added metoprolol d/t BP high  Adjust as needed   Anxiety Home Sertaline scheduled  Alprazolam prn     Obesity based on BMI: Body mass index is 32.61 kg/m.   DVT  prophylaxis: lovenox  IV fluids: havs d/c continuous IV fluids  Nutrition: regular Central lines / invasive devices: none  Code Status: DNR per H&P ACP documentation reviewed: 10/16/22 and none on file in [facility]  TOC needs: TBD Barriers to dispo / significant pending items: expect home when stable, pending clinical improvement and angio tomorrow, expect will be here couple more days

## 2022-10-17 ENCOUNTER — Encounter
Admission: EM | Disposition: A | Discharge status: Home Health | Payer: Self-pay | Source: Home / Self Care | Attending: Student

## 2022-10-17 DIAGNOSIS — L089 Local infection of the skin and subcutaneous tissue, unspecified: Secondary | ICD-10-CM | POA: Diagnosis not present

## 2022-10-17 DIAGNOSIS — Z9889 Other specified postprocedural states: Secondary | ICD-10-CM

## 2022-10-17 DIAGNOSIS — I70235 Atherosclerosis of native arteries of right leg with ulceration of other part of foot: Secondary | ICD-10-CM | POA: Diagnosis not present

## 2022-10-17 DIAGNOSIS — L97529 Non-pressure chronic ulcer of other part of left foot with unspecified severity: Secondary | ICD-10-CM | POA: Diagnosis not present

## 2022-10-17 DIAGNOSIS — Z95828 Presence of other vascular implants and grafts: Secondary | ICD-10-CM

## 2022-10-17 DIAGNOSIS — L97519 Non-pressure chronic ulcer of other part of right foot with unspecified severity: Secondary | ICD-10-CM | POA: Diagnosis not present

## 2022-10-17 DIAGNOSIS — I739 Peripheral vascular disease, unspecified: Secondary | ICD-10-CM | POA: Diagnosis not present

## 2022-10-17 DIAGNOSIS — I70245 Atherosclerosis of native arteries of left leg with ulceration of other part of foot: Secondary | ICD-10-CM

## 2022-10-17 DIAGNOSIS — L03119 Cellulitis of unspecified part of limb: Secondary | ICD-10-CM | POA: Diagnosis not present

## 2022-10-17 DIAGNOSIS — T82856A Stenosis of peripheral vascular stent, initial encounter: Secondary | ICD-10-CM

## 2022-10-17 HISTORY — PX: LOWER EXTREMITY ANGIOGRAPHY: CATH118251

## 2022-10-17 SURGERY — LOWER EXTREMITY ANGIOGRAPHY
Anesthesia: Moderate Sedation | Laterality: Left

## 2022-10-17 MED ORDER — FENTANYL CITRATE (PF) 100 MCG/2ML IJ SOLN
INTRAMUSCULAR | Status: DC | PRN
  Administered 2022-10-17: 50 ug via INTRAVENOUS
  Administered 2022-10-17: 25 ug via INTRAVENOUS
  Administered 2022-10-17: 12.5 ug via INTRAVENOUS

## 2022-10-17 MED ORDER — CEFAZOLIN SODIUM-DEXTROSE 2-4 GM/100ML-% IV SOLN
INTRAVENOUS | Status: AC
  Filled 2022-10-17: qty 100

## 2022-10-17 MED ORDER — CEFAZOLIN SODIUM-DEXTROSE 2-4 GM/100ML-% IV SOLN: 2.0000 g | INTRAVENOUS | Status: DC

## 2022-10-17 MED ORDER — MIDAZOLAM HCL 2 MG/2ML IJ SOLN
INTRAMUSCULAR | Status: AC
  Filled 2022-10-17: qty 2

## 2022-10-17 MED ORDER — FENTANYL CITRATE PF 50 MCG/ML IJ SOSY
PREFILLED_SYRINGE | INTRAMUSCULAR | Status: AC
  Filled 2022-10-17: qty 1

## 2022-10-17 MED ORDER — MIDAZOLAM HCL 2 MG/ML PO SYRP
8.0000 mg | ORAL_SOLUTION | Freq: Once | ORAL | Status: DC | PRN
  Filled 2022-10-17: qty 5

## 2022-10-17 MED ORDER — FAMOTIDINE 20 MG PO TABS: 40.0000 mg | ORAL_TABLET | Freq: Once | ORAL | Status: DC | PRN

## 2022-10-17 MED ORDER — HYDROMORPHONE HCL 1 MG/ML IJ SOLN: 1.0000 mg | Freq: Once | INTRAMUSCULAR | Status: DC | PRN

## 2022-10-17 MED ORDER — LIDOCAINE-EPINEPHRINE (PF) 1 %-1:200000 IJ SOLN
INTRAMUSCULAR | Status: DC | PRN
  Administered 2022-10-17: 10 mL via INTRADERMAL

## 2022-10-17 MED ORDER — CEFAZOLIN SODIUM-DEXTROSE 2-4 GM/100ML-% IV SOLN
INTRAVENOUS | Status: DC | PRN
  Administered 2022-10-17: 2 g via INTRAVENOUS

## 2022-10-17 MED ORDER — ALBUTEROL SULFATE (2.5 MG/3ML) 0.083% IN NEBU
INHALATION_SOLUTION | RESPIRATORY_TRACT | Status: AC
  Filled 2022-10-17: qty 3

## 2022-10-17 MED ORDER — HEPARIN (PORCINE) IN NACL 2000-0.9 UNIT/L-% IV SOLN
INTRAVENOUS | Status: DC | PRN
  Administered 2022-10-17: 1000 mL

## 2022-10-17 MED ORDER — SODIUM CHLORIDE 0.9 % IV SOLN: INTRAVENOUS | Status: DC

## 2022-10-17 MED ORDER — DIPHENHYDRAMINE HCL 50 MG/ML IJ SOLN: 50.0000 mg | Freq: Once | INTRAMUSCULAR | Status: DC | PRN

## 2022-10-17 MED ORDER — MIDAZOLAM HCL 2 MG/2ML IJ SOLN
INTRAMUSCULAR | Status: DC | PRN
  Administered 2022-10-17: 1 mg via INTRAVENOUS
  Administered 2022-10-17: .5 mg via INTRAVENOUS
  Administered 2022-10-17: 2 mg via INTRAVENOUS

## 2022-10-17 MED ORDER — FENTANYL CITRATE PF 50 MCG/ML IJ SOSY: 12.5000 ug | PREFILLED_SYRINGE | Freq: Once | INTRAMUSCULAR | Status: DC | PRN

## 2022-10-17 MED ORDER — METHYLPREDNISOLONE SODIUM SUCC 125 MG IJ SOLR: 125.0000 mg | Freq: Once | INTRAMUSCULAR | Status: DC | PRN

## 2022-10-17 MED ORDER — IODIXANOL 320 MG/ML IV SOLN
INTRAVENOUS | Status: DC | PRN
  Administered 2022-10-17: 60 mL via INTRA_ARTERIAL

## 2022-10-17 MED ORDER — HEPARIN SODIUM (PORCINE) 1000 UNIT/ML IJ SOLN
INTRAMUSCULAR | Status: DC | PRN
  Administered 2022-10-17: 5000 [IU] via INTRAVENOUS

## 2022-10-17 MED ORDER — ONDANSETRON HCL 4 MG/2ML IJ SOLN: 4.0000 mg | Freq: Four times a day (QID) | INTRAMUSCULAR | Status: DC | PRN

## 2022-10-17 MED ORDER — HEPARIN SODIUM (PORCINE) 1000 UNIT/ML IJ SOLN
INTRAMUSCULAR | Status: AC
  Filled 2022-10-17: qty 10

## 2022-10-17 SURGICAL SUPPLY — 19 items
BALLN LUTONIX 018 4X100X130 (BALLOONS) ×1
BALLN LUTONIX 018 5X60X130 (BALLOONS) ×1
BALLOON LUTONIX 018 4X100X130 (BALLOONS) IMPLANT
BALLOON LUTONIX 018 5X60X130 (BALLOONS) IMPLANT
CATH ANGIO 5F PIGTAIL 65CM (CATHETERS) IMPLANT
CATH BEACON 5 .038 100 VERT TP (CATHETERS) IMPLANT
CATH ROTAREX 135 6FR (CATHETERS) IMPLANT
CATH TEMPO 5F RIM 65CM (CATHETERS) IMPLANT
COVER PROBE ULTRASOUND 5X96 (MISCELLANEOUS) IMPLANT
DEVICE STARCLOSE SE CLOSURE (Vascular Products) IMPLANT
GLIDEWIRE ADV .035X260CM (WIRE) IMPLANT
KIT ENCORE 26 ADVANTAGE (KITS) IMPLANT
PACK ANGIOGRAPHY (CUSTOM PROCEDURE TRAY) ×1 IMPLANT
SHEATH ANL2 6FRX45 HC (SHEATH) IMPLANT
SHEATH BRITE TIP 5FRX11 (SHEATH) IMPLANT
SYR MEDRAD MARK 7 150ML (SYRINGE) IMPLANT
TUBING CONTRAST HIGH PRESS 72 (TUBING) IMPLANT
WIRE G V18X300CM (WIRE) IMPLANT
WIRE GUIDERIGHT .035X150 (WIRE) IMPLANT

## 2022-10-17 NOTE — Plan of Care (Signed)
Problem: Education: Goal: Knowledge of General Education information will improve Description: Including pain rating scale, medication(s)/side effects and non-pharmacologic comfort measures Outcome: Progressing   Problem: Health Behavior/Discharge Planning: Goal: Ability to manage health-related needs will improve Outcome: Progressing   Problem: Clinical Measurements: Goal: Cardiovascular complication will be avoided Outcome: Progressing   Problem: Activity: Goal: Risk for activity intolerance will decrease Outcome: Progressing   Problem: Nutrition: Goal: Adequate nutrition will be maintained Outcome: Progressing   Problem: Elimination: Goal: Will not experience complications related to bowel motility Outcome: Progressing Goal: Will not experience complications related to urinary retention Outcome: Progressing   Problem: Pain Managment: Goal: General experience of comfort will improve Outcome: Progressing

## 2022-10-17 NOTE — Op Note (Signed)
[facility]  Percutaneous Study/Intervention Procedural Note   Date of Surgery: 10/17/2022  Surgeon(s):Silus Lanzo    Assistants:none  Pre-operative Diagnosis: PAD with ulceration and infection bilateral lower extremities  Post-operative diagnosis:  Same  Procedure(s) Performed:             1.  Ultrasound guidance for vascular access right femoral artery             2.  Catheter placement into left common femoral artery from right femoral approach             3.  Aortogram and selective left lower extremity angiogram             4.  Percutaneous transluminal angioplasty of left tibioperoneal trunk and popliteal artery with 4 mm diameter by 10 cm length Lutonix drug-coated angioplasty balloon             5.  Atherectomy of left SFA with the [place] Rex device  6.  Percutaneous transluminal angioplasty of the proximal to mid left SFA with 5 mm diameter by 6 cm length Lutonix drug-coated angioplasty balloon             7.  StarClose closure device right femoral artery  EBL: 50 cc  Contrast: 60 cc  Fluoro Time: 4.4 minutes  Moderate Conscious Sedation Time: approximately 44 minutes using 3.5 mg of Versed and 87.5 mcg of Fentanyl              Indications:  Patient is a 72 y.o.female with infected ulcerations on both feet and an extensive history of peripheral vascular disease with revascularization.  The patient is brought in for angiography for further evaluation and potential treatment.  Due to the limb threatening nature of the situation, angiogram was performed for attempted limb salvage. The patient is aware that if the procedure fails, amputation would be expected.  The patient also understands that even with successful revascularization, amputation may still be required due to the severity of the situation.  Risks and benefits are discussed and informed consent is obtained.   Procedure:  The patient was identified and appropriate procedural time out was  performed.  The patient was then placed supine on the table and prepped and draped in the usual sterile fashion. Moderate conscious sedation was administered during a face to face encounter with the patient throughout the procedure with my supervision of the RN administering medicines and monitoring the patient's vital signs, pulse oximetry, telemetry and mental status throughout from the start of the procedure until the patient was taken to the recovery room. Ultrasound was used to evaluate the right common femoral artery.  It was patent .  A digital ultrasound image was acquired.  A Seldinger needle was used to access the right common femoral artery under direct ultrasound guidance and a permanent image was performed.  A 0.035 J wire was advanced without resistance and a 5Fr sheath was placed.  Pigtail catheter was placed into the aorta and an AP aortogram was performed. This demonstrated normal renal arteries and normal aorta and iliac segments without significant stenosis after extensive previous stenting. I then crossed the aortic bifurcation and advanced to the left femoral head. Selective left lower extremity angiogram was then performed. This demonstrated the common femoral artery and profunda femoris artery appeared to be patent after previous endarterectomy.  There were stents from the origin of the SFA down to the proximal to mid popliteal artery.  In the proximal to mid SFA,  there was an area of in-stent stenosis that was highly irregular in the 80 to 90% range.  This was short segment.  The vessel then normalized down to the bottom of the previously placed stents.  In the popliteal artery tracking down into the tibioperoneal trunk was a hyperplastic lesion also in the 80 to 90% range.  There was then two-vessel runoff with an anterior tibial and peroneal artery distally but did not have obvious focal stenosis. It was felt that it was in the patient's best interest to proceed with intervention after  these images to avoid a second procedure and a larger amount of contrast and fluoroscopy based off of the findings from the initial angiogram. The patient was systemically heparinized and a 6 [place] Ansell sheath was then placed over the [employer] wire. I then used a Kumpe catheter and the advantage wire to navigate through the SFA lesion and get down into the popliteal artery where I exchanged for a V18 wire and crossed the stenosis in the popliteal artery and tibioperoneal trunk without difficulty parking the wire in the foot.  I elected to perform atherectomy for the in-stent restenosis in the left proximal to mid SFA.  2 passes were made with the [place] device in this location which improve the flow but still remained about a 50 to 60% residual stenosis.  I then performed angioplasty in this area with a 5 mm diameter by 6 cm length Lutonix drug-coated angioplasty balloon inflated to 12 atm for 1 minute.  Completion imaging showed about a 10 to 15% residual stenosis which was not flow-limiting.  I then turned my attention to the distal lesion.  A 4 mm diameter by 10 cm length Lutonix drug-coated angioplasty balloon was inflated in the popliteal artery and tibioperoneal trunk and taken up to 10 atm for 1 minute.  Completion imaging showed marked improvement with about a 15 to 20% residual stenosis in the popliteal artery and tibioperoneal trunk. I elected to terminate the procedure. The sheath was removed and StarClose closure device was deployed in the right femoral artery with excellent hemostatic result. The patient was taken to the recovery room in stable condition having tolerated the procedure well.  Findings:               Aortogram:  This demonstrated normal renal arteries and normal aorta and iliac segments without significant stenosis after extensive previous stenting.             Left Lower Extremity:  This demonstrated the common femoral artery and profunda femoris artery appeared to be  patent after previous endarterectomy.  There were stents from the origin of the SFA down to the proximal to mid popliteal artery.  In the proximal to mid SFA, there was an area of in-stent stenosis that was highly irregular in the 80 to 90% range.  This was short segment.  The vessel then normalized down to the bottom of the previously placed stents.  In the popliteal artery tracking down into the tibioperoneal trunk was a hyperplastic lesion also in the 80 to 90% range.  There was then two-vessel runoff with an anterior tibial and peroneal artery distally but did not have obvious focal stenosis.   Disposition: Patient was taken to the recovery room in stable condition having tolerated the procedure well.  Complications: None  Festus Barren 10/17/2022 12:19 PM   This note was created with Dragon Medical transcription system. Any errors in dictation are purely unintentional.

## 2022-10-17 NOTE — Progress Notes (Signed)
[facility] Progress Note  Patient: Joan [name]    OZH:[id number]  DOA: 10/15/2022     Date of Service: the patient was seen and examined on 10/17/2022  Chief Complaint  Patient presents with   Wound Infection   Brief hospital course: CAMMI KLUVER is a 72 y.o. Caucasian female with medical history significant for anxiety, osteoarthritis, depression, GERD, hepatitis C, COPD, hypertension and peripheral vascular disease, who presented to the emergency room with acute onset of bilateral feet pain more on the left than the right leg started with a rash in both feet that is concerning for infection and has been going on for several weeks. PCP had tx steroids, rash worsened to open ulcers, also experiencing cough/wheezing   Last seen in office by vascular surgery 05/21/22 - atherosclerosis w/ intermittent claudication bilateral LE, no invasive studies planned. Hx multiple angio/stent LE 10/2017 - 08/2020, with L femoral endarterectomy 06/22/2020    Hospital course / significant events:  09/23: to ED from home, VSS, unremarkable BMP and CBC. Lactic acid was 1.7. ESR 35, XR 2 view left and right feet x-ray revealed soft tissue swelling without evidence for osteomyelitis. Started ceftriaxone. Pulses audible with Doppler.  09/24: vascular surgery recs - plan LLE angio w/ possible intervention for tomrorow    Consultants:  Vascular surgery    Procedures/Surgeries: 10/17/22 LLE drug-coated angioplasty balloon and left SFA atherectomy   Assessment and Plan: Septic emboli w/ cellulitis of foot, in setting of vascular ischemia IV abx w/ ceftriaxone Pain management Wound gram stain / culture  Vascular surgery plan LLE angio w/ possible intervention for tomrorow    PVD (peripheral vascular disease) (HCC) ASA, plavix, statin S/p LLE angioplasty as above    COPD with acute exacerbation (HCC) scheduled and as needed DuoNebs for now. mucolytic therapy. hold off steroid therapy given her  cellulitis, and she is maintaining SoP2 on RA.   GERD without esophagitis PPI   Dyslipidemia Statin   Essential hypertension Continue home losartan  Added metoprolol d/t BP high  Adjust as needed    Anxiety Home Sertaline scheduled  Alprazolam prn    Body mass index is 32.61 kg/m.  Interventions:  Diet: Heart healthy diet DVT Prophylaxis: Subcutaneous Lovenox   Advance goals of care discussion: DNR  Family Communication: family was not present at bedside, at the time of interview.  The pt provided permission to discuss medical plan with the family. Opportunity was given to ask question and all questions were answered satisfactorily.   Disposition:  Pt is from Home, admitted with PAD and cellulitis, on IV antibiotics and vascular surgery eval for angiogram, which precludes a safe discharge. Discharge to Home, when cleared by vascular surgery.  Subjective: No significant events overnight, patient's pain is well-controlled, seen after left lower extremity angiogram, tolerated procedure well, denied any complaints.  Physical Exam: General: NAD, lying comfortably Appear in no distress, affect appropriate Eyes: PERRLA ENT: Oral Mucosa Clear, moist  Neck: no JVD,  Cardiovascular: S1 and S2 Present, no Murmur,  Respiratory: good respiratory effort, Bilateral Air entry equal and Decreased, no Crackles, no wheezes Abdomen: Bowel Sound present, Soft and no tenderness,  Skin: no rashes Extremities: Mild pedal edema, no calf tenderness, healing ulcers bilateral feet near to toes Neurologic: without any new focal findings Gait not checked due to patient safety concerns  Vitals:   10/17/22 1222 10/17/22 1235 10/17/22 1250 10/17/22 1351  BP:  111/61 127/70 (!) 130/51  Pulse:  64 67 (!) 57  Resp:  16 20 19   Temp:    98 F (36.7 C)  TempSrc:      SpO2: 93% 92% 94% 93%  Weight:      Height:        Intake/Output Summary (Last 24 hours) at 10/17/2022 1516 Last data filed at  10/17/2022 0442 Gross per 24 hour  Intake --  Output 0 ml  Net 0 ml   Filed Weights   10/15/22 1441  Weight: 86.2 kg    Data Reviewed: I have personally reviewed and interpreted daily labs, tele strips, imagings as discussed above. I reviewed all nursing notes, pharmacy notes, vitals, pertinent old records I have discussed plan of care as described above with RN and patient/family.  CBC: Recent Labs  Lab 10/15/22 1443 10/16/22 0152  WBC 9.5 11.0*  HGB 13.9 12.8  HCT 44.2 40.1  MCV 91.1 90.5  PLT 282 263   Basic Metabolic Panel: Recent Labs  Lab 10/15/22 1443 10/16/22 0152  NA 139 138  K 4.1 4.1  CL 102 101  CO2 27 27  GLUCOSE 86 113*  BUN 9 11  CREATININE 0.65 0.80  CALCIUM 9.4 8.8*    Studies: PERIPHERAL VASCULAR CATHETERIZATION  Result Date: 10/17/2022 See surgical note for result.   Scheduled Meds:  albuterol       aspirin EC  81 mg Oral Daily   clopidogrel  75 mg Oral Daily   enoxaparin (LOVENOX) injection  0.5 mg/kg Subcutaneous Q24H   losartan  50 mg Oral Daily   metoprolol succinate  25 mg Oral Daily   pantoprazole  40 mg Oral Daily   rosuvastatin  5 mg Oral Daily   sertraline  100 mg Oral Daily   Continuous Infusions:  cefTRIAXone (ROCEPHIN)  IV 2 g (10/16/22 2145)   PRN Meds: acetaminophen **OR** acetaminophen, albuterol, albuterol, ALPRAZolam, loratadine, magnesium hydroxide, ondansetron **OR** ondansetron (ZOFRAN) IV, traZODone  Time spent: 35 minutes  Author: Gillis Santa. MD [facility] 10/17/2022 3:16 PM  To reach On-call, see care teams to locate the attending and reach out to them via [email or web address]. If 7PM-7AM, please contact night-coverage If you still have difficulty reaching the attending provider, please page the [facility] (Director on Call) for [facility] on amion for assistance.

## 2022-10-17 NOTE — Plan of Care (Signed)
Problem: Education: Goal: Knowledge of General Education information will improve Description: Including pain rating scale, medication(s)/side effects and non-pharmacologic comfort measures Outcome: Progressing   Problem: Clinical Measurements: Goal: Will remain free from infection Outcome: Progressing Goal: Cardiovascular complication will be avoided Outcome: Progressing

## 2022-10-17 NOTE — Interval H&P Note (Signed)
History and Physical Interval Note:  10/17/2022 11:15 AM  Joan [name]  has presented today for surgery, with the diagnosis of PAD.  The various methods of treatment have been discussed with the patient and family. After consideration of risks, benefits and other options for treatment, the patient has consented to  Procedure(s): Lower Extremity Angiography (Left) as a surgical intervention.  The patient's history has been reviewed, patient examined, no change in status, stable for surgery.  I have reviewed the patient's chart and labs.  Questions were answered to the patient's satisfaction.     Festus Barren

## 2022-10-17 NOTE — TOC Progression Note (Signed)
Transition of Care [facility]) - Progression Note    Patient Details  Name: ANYELI FIETZ MRN: [id number] Date of Birth: 04-18-1950  Transition of Care [facility]) CM/SW Contact  Marlowe Sax, RN Phone Number: 10/17/2022, 2:11 PM  Clinical Narrative:    infected ulcerations on both feet and an extensive history of peripheral vascular disease with revascularization  The patient also understands that even with successful revascularization, amputation may still be required due to the severity of the situation.   Angiogram completed TOC to follow for Needs    Expected Discharge Plan: Home w Home Health Services Barriers to Discharge: Continued Medical Work up  Expected Discharge Plan and Services                                               Social Determinants of Health (SDOH) Interventions SDOH Screenings   Food Insecurity: No Food Insecurity (10/16/2022)  Housing: Patient Unable To Answer (10/16/2022)  Transportation Needs: No Transportation Needs (10/16/2022)  Utilities: Not At Risk (10/16/2022)  Financial Resource Strain: Low Risk  (08/08/2022)   Received from [facility]  Tobacco Use: Medium Risk (10/15/2022)    Readmission Risk Interventions     No data to display

## 2022-10-18 ENCOUNTER — Encounter: Payer: Self-pay | Admitting: Vascular Surgery

## 2022-10-18 DIAGNOSIS — L03119 Cellulitis of unspecified part of limb: Secondary | ICD-10-CM | POA: Diagnosis not present

## 2022-10-18 LAB — CBC
HCT: 41.3 % (ref 36.0–46.0)
Hemoglobin: 13.2 g/dL (ref 12.0–15.0)
MCH: 28.3 pg (ref 26.0–34.0)
MCHC: 32 g/dL (ref 30.0–36.0)
MCV: 88.6 fL (ref 80.0–100.0)
Platelets: 251 10*3/uL (ref 150–400)
RBC: 4.66 MIL/uL (ref 3.87–5.11)
RDW: 13.8 % (ref 11.5–15.5)
WBC: 9 10*3/uL (ref 4.0–10.5)
nRBC: 0 % (ref 0.0–0.2)

## 2022-10-18 LAB — BASIC METABOLIC PANEL
Anion gap: 11 (ref 5–15)
BUN: 11 mg/dL (ref 8–23)
CO2: 26 mmol/L (ref 22–32)
Calcium: 8.9 mg/dL (ref 8.9–10.3)
Chloride: 101 mmol/L (ref 98–111)
Creatinine, Ser: 0.64 mg/dL (ref 0.44–1.00)
GFR, Estimated: >60 mL/min (ref >60)
Glucose, Bld: 107 mg/dL — ABNORMAL HIGH (ref 70–99)
Potassium: 4 mmol/L (ref 3.5–5.1)
Sodium: 138 mmol/L (ref 135–145)

## 2022-10-18 LAB — PHOSPHORUS: Phosphorus: 3.4 mg/dL (ref 2.5–4.6)

## 2022-10-18 LAB — MAGNESIUM: Magnesium: 2.1 mg/dL (ref 1.7–2.4)

## 2022-10-18 NOTE — Plan of Care (Signed)

## 2022-10-18 NOTE — TOC Progression Note (Signed)
Transition of Care [facility]) - Progression Note    Patient Details  Name: Joan [name] MRN: [id number] Date of Birth: 1950-12-30  Transition of Care [facility]) CM/SW Contact  Marlowe Sax, RN Phone Number: 10/18/2022, 10:40 AM  Clinical Narrative:    TOC continues to follow for needs    Expected Discharge Plan: Home w Home Health Services Barriers to Discharge: Continued Medical Work up  Expected Discharge Plan and Services                                               Social Determinants of Health (SDOH) Interventions SDOH Screenings   Food Insecurity: No Food Insecurity (10/16/2022)  Housing: Patient Unable To Answer (10/16/2022)  Transportation Needs: No Transportation Needs (10/16/2022)  Utilities: Not At Risk (10/16/2022)  Financial Resource Strain: Low Risk  (08/08/2022)   Received from [facility]  Tobacco Use: Medium Risk (10/15/2022)    Readmission Risk Interventions     No data to display

## 2022-10-18 NOTE — TOC Initial Note (Signed)
Transition of Care [facility]) - Initial/Assessment Note    Patient Details  Name: Joan [name] MRN: [id number] Date of Birth: 15-May-1950  Transition of Care [facility]) CM/SW Contact:    Marlowe Sax, RN Phone Number: 10/18/2022, 1:24 PM  Clinical Narrative:                 TOC continues to follow for needs       Expected Discharge Plan: Home w Home Health Services Barriers to Discharge: Continued Medical Work up   Expected Discharge Plan and Services                           Social Determinants of Health (SDOH) Interventions SDOH Screenings       Food Insecurity: No Food Insecurity (10/16/2022)  Housing: Patient Unable To Answer (10/16/2022)  Transportation Needs: No Transportation Needs (10/16/2022)  Utilities: Not At Risk (10/16/2022)  Financial Resource Strain: Low Risk  (08/08/2022)    Received from [facility]  Tobacco Use: Medium Risk (10/15/2022)     Expected Discharge Plan: Home w Home Health Services Barriers to Discharge: Continued Medical Work up   Patient Goals and CMS Choice            Expected Discharge Plan and Services                                              Prior Living Arrangements/Services                       Activities of Daily Living Home Assistive Devices/Equipment: Environmental consultant (specify type) ADL Screening (condition at time of admission) Does the patient have a NEW difficulty with bathing/dressing/toileting/self-feeding that is expected to last >3 days?: No Does the patient have a NEW difficulty with getting in/out of bed, walking, or climbing stairs that is expected to last >3 days?: No Does the patient have a NEW difficulty with communication that is expected to last >3 days?: No Is the patient deaf or have difficulty hearing?: No Does the patient have difficulty seeing, even when wearing glasses/contacts?: No Does the patient have difficulty concentrating, remembering, or making decisions?:  No  Permission Sought/Granted                  Emotional Assessment              Admission diagnosis:  Cellulitis of foot [L03.119] Cellulitis of lower extremity, unspecified laterality [L03.119] Patient Active Problem List   Diagnosis Date Noted   COPD with acute exacerbation (HCC) 10/16/2022   Cellulitis of lower extremity 10/15/2022   PVD (peripheral vascular disease) (HCC) 10/15/2022   Dyslipidemia 10/15/2022   GERD without esophagitis 10/15/2022   Essential hypertension 10/15/2022   Sepsis due to pneumonia (HCC) 09/15/2021   Sepsis (HCC) 09/14/2021   Community acquired pneumonia 09/14/2021   Elevated troponin 09/14/2021   Swelling of limb 08/29/2021   Hospice care patient 06/25/2020   Palliative care patient 06/25/2020   Major depressive disorder, recurrent, in remission (HCC) 06/12/2018   PAD (peripheral artery disease) (HCC) 02/10/2018   Atherosclerosis of native arteries of extremity with intermittent claudication (HCC) 10/25/2017   History of breast cancer in female 10/11/2017   Tobacco user 08/31/2015   Benign essential hypertension 08/31/2015   Anxiety and depression 08/31/2015  H/O seasonal allergies 08/31/2015   Seizure (HCC) 07/15/2013   Encephalopathy acute 07/15/2013   Hypertension 07/15/2013   Alcohol abuse 07/15/2013   Hyponatremia syndrome 07/14/2013   Provoked seizure (HCC) 07/14/2013   Alcohol withdrawal (HCC) 07/14/2013   Hypernatremia 07/14/2013   PCP:  Barbette Reichmann, MD Pharmacy:   [facility] - [place], [place] - [address] [place] [address] Phone: [phone] Fax: [phone]  [id number] [place], [place] - [address] AT [facility] [address] [place] [address] Phone: [phone] Fax: [phone]     Social Determinants of Health (SDOH) Social History: SDOH Screenings   Food Insecurity: No Food Insecurity (10/16/2022)  Housing: Patient  Unable To Answer (10/16/2022)  Transportation Needs: No Transportation Needs (10/16/2022)  Utilities: Not At Risk (10/16/2022)  Financial Resource Strain: Low Risk  (08/08/2022)   Received from [facility]  Tobacco Use: Medium Risk (10/15/2022)   SDOH Interventions:     Readmission Risk Interventions     No data to display

## 2022-10-18 NOTE — Plan of Care (Signed)
  Problem: Pain Managment: Goal: General experience of comfort will improve Outcome: Progressing   Problem: Elimination: Goal: Will not experience complications related to bowel motility Outcome: Progressing   Problem: Safety: Goal: Ability to remain free from injury will improve Outcome: Progressing   Problem: Skin Integrity: Goal: Risk for impaired skin integrity will decrease Outcome: Progressing   Problem: Activity: Goal: Risk for activity intolerance will decrease Outcome: Progressing

## 2022-10-18 NOTE — Progress Notes (Signed)
[facility] Progress Note  Patient: Joan [name]    ZOX:[id number]  DOA: 10/15/2022     Date of Service: the patient was seen and examined on 10/18/2022  Chief Complaint  Patient presents with   Wound Infection   Brief hospital course: ILLONA GOUGE is a 72 y.o. Caucasian female with medical history significant for anxiety, osteoarthritis, depression, GERD, hepatitis C, COPD, hypertension and peripheral vascular disease, who presented to the emergency room with acute onset of bilateral feet pain more on the left than the right leg started with a rash in both feet that is concerning for infection and has been going on for several weeks. PCP had tx steroids, rash worsened to open ulcers, also experiencing cough/wheezing   Last seen in office by vascular surgery 05/21/22 - atherosclerosis w/ intermittent claudication bilateral LE, no invasive studies planned. Hx multiple angio/stent LE 10/2017 - 08/2020, with L femoral endarterectomy 06/22/2020    Hospital course / significant events:  09/23: to ED from home, VSS, unremarkable BMP and CBC. Lactic acid was 1.7. ESR 35, XR 2 view left and right feet x-ray revealed soft tissue swelling without evidence for osteomyelitis. Started ceftriaxone. Pulses audible with Doppler.  09/24: vascular surgery recs - LLE angio w/ possible intervention on 9/25     Consultants:  Vascular surgery    Procedures/Surgeries: 10/17/22 LLE drug-coated angioplasty balloon and left SFA atherectomy   Assessment and Plan: Septic emboli w/ cellulitis of foot, in setting of vascular ischemia IV abx w/ ceftriaxone Pain management Wound gram stain / culture  Vascular surgery, s/p LLE angio w/ intervention as above done on 9/25   PVD (peripheral vascular disease) (HCC) ASA, plavix, statin S/p LLE angioplasty as above Follow vascular surgery, as per patient vascular surgery is planning for right lower extremity angiogram on Monday, 10/22/2022  COPD with acute  exacerbation (HCC) scheduled and as needed DuoNebs for now. mucolytic therapy. hold off steroid therapy given her cellulitis, and she is maintaining SoP2 on RA.   GERD without esophagitis PPI   Dyslipidemia Statin   Essential hypertension Continue home losartan  Added metoprolol d/t BP high  Adjust as needed    Anxiety Home Sertaline scheduled  Alprazolam prn    Body mass index is 32.61 kg/m.  Interventions:  Diet: Heart healthy diet DVT Prophylaxis: Subcutaneous Lovenox   Advance goals of care discussion: DNR  Family Communication: family was not present at bedside, at the time of interview.  The pt provided permission to discuss medical plan with the family. Opportunity was given to ask question and all questions were answered satisfactorily.   Disposition:  Pt is from Home, admitted with PAD and cellulitis, on IV antibiotics and vascular surgery eval for angiogram, which precludes a safe discharge. Discharge to Home, when cleared by vascular surgery.  Subjective: No significant events overnight, pain in the left foot is 5/10, well-controlled, denied any complaints.  Patient feels little bit improvement in the left lower extremity after angioplasty yesterday.   Physical Exam: General: NAD, lying comfortably Appear in no distress, affect appropriate Eyes: PERRLA ENT: Oral Mucosa Clear, moist  Neck: no JVD,  Cardiovascular: S1 and S2 Present, no Murmur,  Respiratory: good respiratory effort, Bilateral Air entry equal and Decreased, no Crackles, no wheezes Abdomen: Bowel Sound present, Soft and no tenderness,  Skin: no rashes Extremities: Mild pedal edema, no calf tenderness, healing ulcers bilateral feet near to toes Neurologic: without any new focal findings Gait not checked due to patient safety concerns  Vitals:   10/17/22 1928 10/17/22 2314 10/18/22 0420 10/18/22 0943  BP: (!) 150/63 (!) 142/50 (!) 147/54 136/68  Pulse: 65 62 63 83  Resp: 20 20 20 17    Temp: 97.8 F (36.6 C) 97.7 F (36.5 C) 97.6 F (36.4 C) 98 F (36.7 C)  TempSrc: Oral Oral    SpO2: 94% 92% 92% 93%  Weight:      Height:        Intake/Output Summary (Last 24 hours) at 10/18/2022 1330 Last data filed at 10/18/2022 1233 Gross per 24 hour  Intake 720 ml  Output 1 ml  Net 719 ml   Filed Weights   10/15/22 1441  Weight: 86.2 kg    Data Reviewed: I have personally reviewed and interpreted daily labs, tele strips, imagings as discussed above. I reviewed all nursing notes, pharmacy notes, vitals, pertinent old records I have discussed plan of care as described above with RN and patient/family.  CBC: Recent Labs  Lab 10/15/22 1443 10/16/22 0152 10/18/22 0337  WBC 9.5 11.0* 9.0  HGB 13.9 12.8 13.2  HCT 44.2 40.1 41.3  MCV 91.1 90.5 88.6  PLT 282 263 251   Basic Metabolic Panel: Recent Labs  Lab 10/15/22 1443 10/16/22 0152 10/18/22 0337  NA 139 138 138  K 4.1 4.1 4.0  CL 102 101 101  CO2 27 27 26   GLUCOSE 86 113* 107*  BUN 9 11 11   CREATININE 0.65 0.80 0.64  CALCIUM 9.4 8.8* 8.9  MG  --   --  2.1  PHOS  --   --  3.4    Studies: No results found.  Scheduled Meds:  aspirin EC  81 mg Oral Daily   clopidogrel  75 mg Oral Daily   enoxaparin (LOVENOX) injection  0.5 mg/kg Subcutaneous Q24H   losartan  50 mg Oral Daily   metoprolol succinate  25 mg Oral Daily   pantoprazole  40 mg Oral Daily   rosuvastatin  5 mg Oral Daily   sertraline  100 mg Oral Daily   Continuous Infusions:  cefTRIAXone (ROCEPHIN)  IV 2 g (10/17/22 2208)   PRN Meds: acetaminophen **OR** acetaminophen, albuterol, ALPRAZolam, loratadine, magnesium hydroxide, ondansetron **OR** ondansetron (ZOFRAN) IV, traZODone  Time spent: 35 minutes  Author: Gillis Santa. MD [facility] 10/18/2022 1:30 PM  To reach On-call, see care teams to locate the attending and reach out to them via [email or web address]. If 7PM-7AM, please contact night-coverage If you still have difficulty  reaching the attending provider, please page the [facility] (Director on Call) for [facility] on amion for assistance.

## 2022-10-19 DIAGNOSIS — L03119 Cellulitis of unspecified part of limb: Secondary | ICD-10-CM | POA: Diagnosis not present

## 2022-10-19 LAB — BASIC METABOLIC PANEL
Anion gap: 10 (ref 5–15)
BUN: 15 mg/dL (ref 8–23)
CO2: 26 mmol/L (ref 22–32)
Calcium: 9.3 mg/dL (ref 8.9–10.3)
Chloride: 100 mmol/L (ref 98–111)
Creatinine, Ser: 0.7 mg/dL (ref 0.44–1.00)
GFR, Estimated: >60 mL/min (ref >60)
Glucose, Bld: 108 mg/dL — ABNORMAL HIGH (ref 70–99)
Potassium: 3.6 mmol/L (ref 3.5–5.1)
Sodium: 136 mmol/L (ref 135–145)

## 2022-10-19 LAB — CBC
HCT: 40 % (ref 36.0–46.0)
Hemoglobin: 13.2 g/dL (ref 12.0–15.0)
MCH: 28.7 pg (ref 26.0–34.0)
MCHC: 33 g/dL (ref 30.0–36.0)
MCV: 87 fL (ref 80.0–100.0)
Platelets: 277 10*3/uL (ref 150–400)
RBC: 4.6 MIL/uL (ref 3.87–5.11)
RDW: 13.7 % (ref 11.5–15.5)
WBC: 10.3 10*3/uL (ref 4.0–10.5)
nRBC: 0 % (ref 0.0–0.2)

## 2022-10-19 LAB — PHOSPHORUS: Phosphorus: 4.1 mg/dL (ref 2.5–4.6)

## 2022-10-19 LAB — MAGNESIUM: Magnesium: 2 mg/dL (ref 1.7–2.4)

## 2022-10-19 MED ORDER — HYDRALAZINE HCL 20 MG/ML IJ SOLN: 10.0000 mg | Freq: Four times a day (QID) | INTRAMUSCULAR | Status: DC | PRN

## 2022-10-19 MED ORDER — HYDRALAZINE HCL 50 MG PO TABS: 50.0000 mg | ORAL_TABLET | Freq: Four times a day (QID) | ORAL | Status: DC | PRN

## 2022-10-19 NOTE — Plan of Care (Signed)

## 2022-10-19 NOTE — H&P (View-Only) (Signed)
  Progress Note    10/19/2022 10:03 AM 2 Days Post-Op  Subjective:  Joan [name] is a 72 yo female who is now POD#2 from left lower extremity angiogram with arthrectomy of SFA and angioplasty.  Patient is resting comfortably in bed this morning.  She has no complaints.  Patient endorses she feels that the sores to her left lower extremity are getting better.  She endorses having less pain but there is still some pain to her foot.  No complaints overnight vitals all remained stable.   Vitals:   10/18/22 2340 10/19/22 0830  BP: (!) 183/69 (!) 143/67  Pulse: 60 71  Resp: 20 18  Temp: 98.3 F (36.8 C) 98.4 F (36.9 C)  SpO2: 94% 91%   Physical Exam: Cardiac:  RRR, normal S1-S2.  No murmurs appreciated. Lungs: Clear on auscultation throughout, no rales rhonchi or wheezing noted. Incisions: Right groin with dressing intact.  Clean and dry no hematoma seroma to note. Extremities: Bilateral lower extremities remain with septic emboli attic sores.  Both lower extremities warm to touch.  Doppler pulses strong. Abdomen: Positive bowel sounds throughout, soft, nontender nondistended. Neurologic: Alert and oriented x 4, follows commands and answers all questions appropriately.  CBC    Component Value Date/Time   WBC 10.3 10/19/2022 0432   RBC 4.60 10/19/2022 0432   HGB 13.2 10/19/2022 0432   HCT 40.0 10/19/2022 0432   PLT 277 10/19/2022 0432   MCV 87.0 10/19/2022 0432   MCH 28.7 10/19/2022 0432   MCHC 33.0 10/19/2022 0432   RDW 13.7 10/19/2022 0432   LYMPHSABS 2.5 09/18/2021 0807   MONOABS 0.7 09/18/2021 0807   EOSABS 0.5 09/18/2021 0807   BASOSABS 0.1 09/18/2021 0807    BMET    Component Value Date/Time   NA 136 10/19/2022 0432   K 3.6 10/19/2022 0432   CL 100 10/19/2022 0432   CO2 26 10/19/2022 0432   GLUCOSE 108 (H) 10/19/2022 0432   BUN 15 10/19/2022 0432   CREATININE 0.70 10/19/2022 0432   CALCIUM 9.3 10/19/2022 0432   GFRNONAA >60 10/19/2022 0432   GFRAA >60  11/12/2017 0948    INR    Component Value Date/Time   INR 1.2 09/15/2021 0644     Intake/Output Summary (Last 24 hours) at 10/19/2022 1003 Last data filed at 10/18/2022 1233 Gross per 24 hour  Intake 480 ml  Output --  Net 480 ml     Assessment/Plan:  72 y.o. female is s/p  left lower extremity angiogram with arthrectomy of SFA and angioplasty. 2 Days Post-Op   PLAN: Continue antibiotics as patient's septic emboli appear to be healing well. Plan is to take the patient to the vascular lab again on Monday for right lower extremity angiogram with possible intervention.  I discussed in detail with the patient the procedure, risk, benefits, and complications.  She verbalized her understanding.  I answered all the patient's questions this morning.  She wishes to proceed on Monday.  Patient was made n.p.o. after midnight Sunday night into Monday.  Patient was alerted to the fact that the schedule is very full on Monday and we may have to change this to Tuesday.  She is in agreement with this plan.  DVT prophylaxis: ASA 81 mg daily, Plavix 75 mg daily, Crestor 5 mg daily.  Lovenox 42.5 mg every 24 hours   Marcie Bal [facility] 10/19/2022 10:03 AM

## 2022-10-19 NOTE — Progress Notes (Signed)
  Progress Note    10/19/2022 10:03 AM 2 Days Post-Op  Subjective:  Joan [name] is a 72 yo female who is now POD#2 from left lower extremity angiogram with arthrectomy of SFA and angioplasty.  Patient is resting comfortably in bed this morning.  She has no complaints.  Patient endorses she feels that the sores to her left lower extremity are getting better.  She endorses having less pain but there is still some pain to her foot.  No complaints overnight vitals all remained stable.   Vitals:   10/18/22 2340 10/19/22 0830  BP: (!) 183/69 (!) 143/67  Pulse: 60 71  Resp: 20 18  Temp: 98.3 F (36.8 C) 98.4 F (36.9 C)  SpO2: 94% 91%   Physical Exam: Cardiac:  RRR, normal S1-S2.  No murmurs appreciated. Lungs: Clear on auscultation throughout, no rales rhonchi or wheezing noted. Incisions: Right groin with dressing intact.  Clean and dry no hematoma seroma to note. Extremities: Bilateral lower extremities remain with septic emboli attic sores.  Both lower extremities warm to touch.  Doppler pulses strong. Abdomen: Positive bowel sounds throughout, soft, nontender nondistended. Neurologic: Alert and oriented x 4, follows commands and answers all questions appropriately.  CBC    Component Value Date/Time   WBC 10.3 10/19/2022 0432   RBC 4.60 10/19/2022 0432   HGB 13.2 10/19/2022 0432   HCT 40.0 10/19/2022 0432   PLT 277 10/19/2022 0432   MCV 87.0 10/19/2022 0432   MCH 28.7 10/19/2022 0432   MCHC 33.0 10/19/2022 0432   RDW 13.7 10/19/2022 0432   LYMPHSABS 2.5 09/18/2021 0807   MONOABS 0.7 09/18/2021 0807   EOSABS 0.5 09/18/2021 0807   BASOSABS 0.1 09/18/2021 0807    BMET    Component Value Date/Time   NA 136 10/19/2022 0432   K 3.6 10/19/2022 0432   CL 100 10/19/2022 0432   CO2 26 10/19/2022 0432   GLUCOSE 108 (H) 10/19/2022 0432   BUN 15 10/19/2022 0432   CREATININE 0.70 10/19/2022 0432   CALCIUM 9.3 10/19/2022 0432   GFRNONAA >60 10/19/2022 0432   GFRAA >60  11/12/2017 0948    INR    Component Value Date/Time   INR 1.2 09/15/2021 0644     Intake/Output Summary (Last 24 hours) at 10/19/2022 1003 Last data filed at 10/18/2022 1233 Gross per 24 hour  Intake 480 ml  Output --  Net 480 ml     Assessment/Plan:  72 y.o. female is s/p  left lower extremity angiogram with arthrectomy of SFA and angioplasty. 2 Days Post-Op   PLAN: Continue antibiotics as patient's septic emboli appear to be healing well. Plan is to take the patient to the vascular lab again on Monday for right lower extremity angiogram with possible intervention.  I discussed in detail with the patient the procedure, risk, benefits, and complications.  She verbalized her understanding.  I answered all the patient's questions this morning.  She wishes to proceed on Monday.  Patient was made n.p.o. after midnight Sunday night into Monday.  Patient was alerted to the fact that the schedule is very full on Monday and we may have to change this to Tuesday.  She is in agreement with this plan.  DVT prophylaxis: ASA 81 mg daily, Plavix 75 mg daily, Crestor 5 mg daily.  Lovenox 42.5 mg every 24 hours   Marcie [facility] 10/19/2022 10:03 AM

## 2022-10-19 NOTE — Progress Notes (Signed)
[facility] Progress Note  Patient: Joan [name]    ZOX:[id number]  DOA: 10/15/2022     Date of Service: the patient was seen and examined on 10/19/2022  Chief Complaint  Patient presents with   Wound Infection   Brief hospital course: Joan [name] is a 72 y.o. Caucasian female with medical history significant for anxiety, osteoarthritis, depression, GERD, hepatitis C, COPD, hypertension and peripheral vascular disease, who presented to the emergency room with acute onset of bilateral feet pain more on the left than the right leg started with a rash in both feet that is concerning for infection and has been going on for several weeks. PCP had tx steroids, rash worsened to open ulcers, also experiencing cough/wheezing   Last seen in office by vascular surgery 05/21/22 - atherosclerosis w/ intermittent claudication bilateral LE, no invasive studies planned. Hx multiple angio/stent LE 10/2017 - 08/2020, with L femoral endarterectomy 06/22/2020    Hospital course / significant events:  09/23: to ED from home, VSS, unremarkable BMP and CBC. Lactic acid was 1.7. ESR 35, XR 2 view left and right feet x-ray revealed soft tissue swelling without evidence for osteomyelitis. Started ceftriaxone. Pulses audible with Doppler.  09/24: vascular surgery recs - LLE angio w/ possible intervention on 9/25     Consultants:  Vascular surgery    Procedures/Surgeries: 10/17/22 LLE drug-coated angioplasty balloon and left SFA atherectomy   Assessment and Plan: Septic emboli w/ cellulitis of foot, in setting of vascular ischemia IV abx w/ ceftriaxone Pain management Wound gram stain / culture  Vascular surgery, s/p LLE angio w/ intervention as above done on 9/25   PVD (peripheral vascular disease) (HCC) ASA, plavix, statin S/p LLE angioplasty as above Follow vascular surgery, as per patient vascular surgery is planning for right lower extremity angiogram on Monday, 10/22/2022  COPD with acute  exacerbation (HCC) scheduled and as needed DuoNebs for now. mucolytic therapy. hold off steroid therapy given her cellulitis, and she is maintaining SoP2 on RA.   GERD without esophagitis PPI   Dyslipidemia Statin   Essential hypertension Continue home losartan  Added metoprolol d/t BP high  Adjust as needed  Use IV hydralazine as needed  Anxiety Home Sertaline scheduled  Alprazolam prn    Body mass index is 32.61 kg/m.  Interventions:  Diet: Heart healthy diet DVT Prophylaxis: Subcutaneous Lovenox   Advance goals of care discussion: DNR  Family Communication: family was not present at bedside, at the time of interview.  The pt provided permission to discuss medical plan with the family. Opportunity was given to ask question and all questions were answered satisfactorily.   Disposition:  Pt is from Home, admitted with PAD and cellulitis, on IV antibiotics and vascular surgery eval for angiogram, which precludes a safe discharge. Discharge to Home, when cleared by vascular surgery.  Subjective: No significant events overnight, pain in the left foot is 3/10, well-controlled today, denied any complaints.  Gradually getting better, no any other complaints.  Physical Exam: General: NAD, lying comfortably Appear in no distress, affect appropriate Eyes: PERRLA ENT: Oral Mucosa Clear, moist  Neck: no JVD,  Cardiovascular: S1 and S2 Present, no Murmur,  Respiratory: good respiratory effort, Bilateral Air entry equal and Decreased, no Crackles, no wheezes Abdomen: Bowel Sound present, Soft and no tenderness,  Skin: no rashes Extremities: Mild pedal edema, no calf tenderness, healing ulcers bilateral feet near to toes Neurologic: without any new focal findings Gait not checked due to patient safety concerns  Vitals:  10/18/22 0943 10/18/22 1533 10/18/22 2340 10/19/22 0830  BP: 136/68 (!) 152/67 (!) 183/69 (!) 143/67  Pulse: 83 (!) 57 60 71  Resp: 17 17 20 18   Temp: 98  F (36.7 C) 98 F (36.7 C) 98.3 F (36.8 C) 98.4 F (36.9 C)  TempSrc:   Oral   SpO2: 93% 95% 94% 91%  Weight:      Height:        Intake/Output Summary (Last 24 hours) at 10/19/2022 1254 Last data filed at 10/19/2022 0900 Gross per 24 hour  Intake 480 ml  Output --  Net 480 ml   Filed Weights   10/15/22 1441  Weight: 86.2 kg    Data Reviewed: I have personally reviewed and interpreted daily labs, tele strips, imagings as discussed above. I reviewed all nursing notes, pharmacy notes, vitals, pertinent old records I have discussed plan of care as described above with RN and patient/family.  CBC: Recent Labs  Lab 10/15/22 1443 10/16/22 0152 10/18/22 0337 10/19/22 0432  WBC 9.5 11.0* 9.0 10.3  HGB 13.9 12.8 13.2 13.2  HCT 44.2 40.1 41.3 40.0  MCV 91.1 90.5 88.6 87.0  PLT 282 263 251 277   Basic Metabolic Panel: Recent Labs  Lab 10/15/22 1443 10/16/22 0152 10/18/22 0337 10/19/22 0432  NA 139 138 138 136  K 4.1 4.1 4.0 3.6  CL 102 101 101 100  CO2 27 27 26 26   GLUCOSE 86 113* 107* 108*  BUN 9 11 11 15   CREATININE 0.65 0.80 0.64 0.70  CALCIUM 9.4 8.8* 8.9 9.3  MG  --   --  2.1 2.0  PHOS  --   --  3.4 4.1    Studies: No results found.  Scheduled Meds:  aspirin EC  81 mg Oral Daily   clopidogrel  75 mg Oral Daily   enoxaparin (LOVENOX) injection  0.5 mg/kg Subcutaneous Q24H   losartan  50 mg Oral Daily   metoprolol succinate  25 mg Oral Daily   pantoprazole  40 mg Oral Daily   rosuvastatin  5 mg Oral Daily   sertraline  100 mg Oral Daily   Continuous Infusions:  cefTRIAXone (ROCEPHIN)  IV 2 g (10/18/22 2130)   PRN Meds: acetaminophen **OR** acetaminophen, albuterol, ALPRAZolam, hydrALAZINE **OR** hydrALAZINE, loratadine, magnesium hydroxide, ondansetron **OR** ondansetron (ZOFRAN) IV, traZODone  Time spent: 35 minutes  Author: Gillis Santa. MD [facility] 10/19/2022 12:54 PM  To reach On-call, see care teams to locate the attending and  reach out to them via [email or web address]. If 7PM-7AM, please contact night-coverage If you still have difficulty reaching the attending provider, please page the [facility] (Director on Call) for [facility] on amion for assistance.

## 2022-10-19 NOTE — Care Management Important Message (Signed)
Important Message  Patient Details  Name: Joan [name] MRN: [id number] Date of Birth: March 30, 1950   Important Message Given:  Yes - Medicare IM     [name] [name] 10/19/2022, 1:27 PM

## 2022-10-20 DIAGNOSIS — L03119 Cellulitis of unspecified part of limb: Secondary | ICD-10-CM | POA: Diagnosis not present

## 2022-10-20 LAB — BASIC METABOLIC PANEL
Anion gap: 8 (ref 5–15)
BUN: 20 mg/dL (ref 8–23)
CO2: 24 mmol/L (ref 22–32)
Calcium: 8.7 mg/dL — ABNORMAL LOW (ref 8.9–10.3)
Chloride: 105 mmol/L (ref 98–111)
Creatinine, Ser: 0.69 mg/dL (ref 0.44–1.00)
GFR, Estimated: >60 mL/min (ref >60)
Glucose, Bld: 107 mg/dL — ABNORMAL HIGH (ref 70–99)
Potassium: 3.9 mmol/L (ref 3.5–5.1)
Sodium: 137 mmol/L (ref 135–145)

## 2022-10-20 LAB — CBC
HCT: 38.4 % (ref 36.0–46.0)
Hemoglobin: 12.9 g/dL (ref 12.0–15.0)
MCH: 29.5 pg (ref 26.0–34.0)
MCHC: 33.6 g/dL (ref 30.0–36.0)
MCV: 87.9 fL (ref 80.0–100.0)
Platelets: 270 10*3/uL (ref 150–400)
RBC: 4.37 MIL/uL (ref 3.87–5.11)
RDW: 13.9 % (ref 11.5–15.5)
WBC: 10.8 10*3/uL — ABNORMAL HIGH (ref 4.0–10.5)
nRBC: 0 % (ref 0.0–0.2)

## 2022-10-20 LAB — MAGNESIUM: Magnesium: 2.1 mg/dL (ref 1.7–2.4)

## 2022-10-20 LAB — PHOSPHORUS: Phosphorus: 3.9 mg/dL (ref 2.5–4.6)

## 2022-10-20 MED ORDER — IPRATROPIUM-ALBUTEROL 0.5-2.5 (3) MG/3ML IN SOLN
3.0000 mL | RESPIRATORY_TRACT | Status: DC | PRN
  Administered 2022-10-22: 3 mL via RESPIRATORY_TRACT
  Filled 2022-10-20: qty 3

## 2022-10-20 MED ORDER — GUAIFENESIN-DM 100-10 MG/5ML PO SYRP: 5.0000 mL | ORAL_SOLUTION | ORAL | Status: DC | PRN

## 2022-10-20 MED ORDER — FLUTICASONE FUROATE-VILANTEROL 200-25 MCG/ACT IN AEPB
1.0000 | INHALATION_SPRAY | Freq: Every day | RESPIRATORY_TRACT | Status: DC
  Administered 2022-10-20 – 2022-10-23 (×3): 1 via RESPIRATORY_TRACT
  Filled 2022-10-20: qty 28

## 2022-10-20 MED ORDER — ENOXAPARIN SODIUM 40 MG/0.4ML IJ SOSY
40.0000 mg | PREFILLED_SYRINGE | Freq: Every day | INTRAMUSCULAR | Status: DC
  Administered 2022-10-20 – 2022-10-22 (×3): 40 mg via SUBCUTANEOUS
  Filled 2022-10-20 (×3): qty 0.4

## 2022-10-20 NOTE — Progress Notes (Signed)
[facility] Progress Note  Patient: Joan [name]    WUX:[id number]  DOA: 10/15/2022     Date of Service: the patient was seen and examined on 10/20/2022  Chief Complaint  Patient presents with   Wound Infection   Brief hospital course: Joan ROMANIELLO is a 72 y.o. Caucasian female with medical history significant for anxiety, osteoarthritis, depression, GERD, hepatitis C, COPD, hypertension and peripheral vascular disease, who presented to the emergency room with acute onset of bilateral feet pain more on the left than the right leg started with a rash in both feet that is concerning for infection and has been going on for several weeks. PCP had tx steroids, rash worsened to open ulcers, also experiencing cough/wheezing   Last seen in office by vascular surgery 05/21/22 - atherosclerosis w/ intermittent claudication bilateral LE, no invasive studies planned. Hx multiple angio/stent LE 10/2017 - 08/2020, with L femoral endarterectomy 06/22/2020    Hospital course / significant events:  09/23: to ED from home, VSS, unremarkable BMP and CBC. Lactic acid was 1.7. ESR 35, XR 2 view left and right feet x-ray revealed soft tissue swelling without evidence for osteomyelitis. Started ceftriaxone. Pulses audible with Doppler.  09/24: vascular surgery recs - LLE angio w/ possible intervention on 9/25     Consultants:  Vascular surgery    Procedures/Surgeries: 10/17/22 LLE drug-coated angioplasty balloon and left SFA atherectomy   Assessment and Plan: Septic emboli w/ cellulitis of foot, in setting of vascular ischemia IV abx w/ ceftriaxone Pain management Wound gram stain / culture  Vascular surgery, s/p LLE angio w/ intervention as above done on 9/25   PVD (peripheral vascular disease) (HCC) ASA, plavix, statin S/p LLE angioplasty as above Follow vascular surgery, as per patient vascular surgery is planning for right lower extremity angiogram on Monday, 10/22/2022  COPD with acute  exacerbation  Started Breo Ellipta inhaler, continue DuoNeb as needed Use Robitussin DM as needed for cough  GERD without esophagitis PPI   Dyslipidemia Statin   Essential hypertension Continue home losartan  Added metoprolol d/t BP high  Adjust as needed  Use IV hydralazine as needed  Anxiety Home Sertaline scheduled  Alprazolam prn    Body mass index is 32.61 kg/m.  Interventions:  Diet: Heart healthy diet DVT Prophylaxis: Subcutaneous Lovenox   Advance goals of care discussion: DNR  Family Communication: family was not present at bedside, at the time of interview.  The pt provided permission to discuss medical plan with the family. Opportunity was given to ask question and all questions were answered satisfactorily.   Disposition:  Pt is from Home, admitted with PAD and cellulitis, on IV antibiotics and vascular surgery eval for angiogram, which precludes a safe discharge. Discharge to Home, when cleared by vascular surgery.  Subjective: No significant events overnight, pain in the left foot is 3/10 but having some difficulty breathing, she normally uses albuterol inhaler at home, not on any maintenance therapy. Started inhaler as above.  We will continue to monitor. Patient denied any chest pain or palpitations   Physical Exam: General: NAD, lying comfortably Appear in no distress, affect appropriate Eyes: PERRLA ENT: Oral Mucosa Clear, moist  Neck: no JVD,  Cardiovascular: S1 and S2 Present, no Murmur,  Respiratory: Equal air entry bilaterally, mild respiratory distress, no crackles, bilateral wheezing.   Abdomen: Bowel Sound present, Soft and no tenderness,  Skin: no rashes Extremities: Mild pedal edema, no calf tenderness, healing ulcers bilateral feet near to toes Neurologic: without any  new focal findings Gait not checked due to patient safety concerns  Vitals:   10/18/22 2340 10/19/22 0830 10/19/22 1613 10/20/22 0822  BP: (!) 183/69 (!) 143/67  105/63 (!) 142/76  Pulse: 60 71 63 71  Resp: 20 18 18 18   Temp: 98.3 F (36.8 C) 98.4 F (36.9 C) (!) 97.5 F (36.4 C) 98 F (36.7 C)  TempSrc: Oral     SpO2: 94% 91% 95% 91%  Weight:      Height:        Intake/Output Summary (Last 24 hours) at 10/20/2022 1213 Last data filed at 10/19/2022 1313 Gross per 24 hour  Intake 240 ml  Output --  Net 240 ml   Filed Weights   10/15/22 1441  Weight: 86.2 kg    Data Reviewed: I have personally reviewed and interpreted daily labs, tele strips, imagings as discussed above. I reviewed all nursing notes, pharmacy notes, vitals, pertinent old records I have discussed plan of care as described above with RN and patient/family.  CBC: Recent Labs  Lab 10/15/22 1443 10/16/22 0152 10/18/22 0337 10/19/22 0432 10/20/22 0500  WBC 9.5 11.0* 9.0 10.3 10.8*  HGB 13.9 12.8 13.2 13.2 12.9  HCT 44.2 40.1 41.3 40.0 38.4  MCV 91.1 90.5 88.6 87.0 87.9  PLT 282 263 251 277 270   Basic Metabolic Panel: Recent Labs  Lab 10/15/22 1443 10/16/22 0152 10/18/22 0337 10/19/22 0432 10/20/22 0500  NA 139 138 138 136 137  K 4.1 4.1 4.0 3.6 3.9  CL 102 101 101 100 105  CO2 27 27 26 26 24   GLUCOSE 86 113* 107* 108* 107*  BUN 9 11 11 15 20   CREATININE 0.65 0.80 0.64 0.70 0.69  CALCIUM 9.4 8.8* 8.9 9.3 8.7*  MG  --   --  2.1 2.0 2.1  PHOS  --   --  3.4 4.1 3.9    Studies: No results found.  Scheduled Meds:  aspirin EC  81 mg Oral Daily   clopidogrel  75 mg Oral Daily   enoxaparin (LOVENOX) injection  40 mg Subcutaneous QHS   fluticasone furoate-vilanterol  1 puff Inhalation Daily   losartan  50 mg Oral Daily   metoprolol succinate  25 mg Oral Daily   pantoprazole  40 mg Oral Daily   rosuvastatin  5 mg Oral Daily   sertraline  100 mg Oral Daily   Continuous Infusions:  cefTRIAXone (ROCEPHIN)  IV 2 g (10/19/22 2130)   PRN Meds: acetaminophen **OR** acetaminophen, ALPRAZolam, hydrALAZINE **OR** hydrALAZINE, ipratropium-albuterol,  loratadine, magnesium hydroxide, ondansetron **OR** ondansetron (ZOFRAN) IV, traZODone  Time spent: 35 minutes  Author: Gillis Santa. MD [facility] 10/20/2022 12:13 PM  To reach On-call, see care teams to locate the attending and reach out to them via [email or web address]. If 7PM-7AM, please contact night-coverage If you still have difficulty reaching the attending provider, please page the [facility] (Director on Call) for [facility] on amion for assistance.

## 2022-10-20 NOTE — Plan of Care (Signed)

## 2022-10-20 NOTE — Plan of Care (Signed)

## 2022-10-21 DIAGNOSIS — L03119 Cellulitis of unspecified part of limb: Secondary | ICD-10-CM | POA: Diagnosis not present

## 2022-10-21 LAB — CBC
HCT: 36.5 % (ref 36.0–46.0)
Hemoglobin: 12.1 g/dL (ref 12.0–15.0)
MCH: 28.9 pg (ref 26.0–34.0)
MCHC: 33.2 g/dL (ref 30.0–36.0)
MCV: 87.3 fL (ref 80.0–100.0)
Platelets: 286 10*3/uL (ref 150–400)
RBC: 4.18 MIL/uL (ref 3.87–5.11)
RDW: 14 % (ref 11.5–15.5)
WBC: 9.8 10*3/uL (ref 4.0–10.5)
nRBC: 0 % (ref 0.0–0.2)

## 2022-10-21 LAB — BASIC METABOLIC PANEL
Anion gap: 7 (ref 5–15)
BUN: 19 mg/dL (ref 8–23)
CO2: 25 mmol/L (ref 22–32)
Calcium: 9 mg/dL (ref 8.9–10.3)
Chloride: 107 mmol/L (ref 98–111)
Creatinine, Ser: 0.72 mg/dL (ref 0.44–1.00)
GFR, Estimated: >60 mL/min (ref >60)
Glucose, Bld: 97 mg/dL (ref 70–99)
Potassium: 3.8 mmol/L (ref 3.5–5.1)
Sodium: 139 mmol/L (ref 135–145)

## 2022-10-21 NOTE — Plan of Care (Signed)
°  Problem: Education: Goal: Knowledge of General Education information will improve Description: Including pain rating scale, medication(s)/side effects and non-pharmacologic comfort measures Outcome: Progressing   Problem: Health Behavior/Discharge Planning: Goal: Ability to manage health-related needs will improve Outcome: Progressing   Problem: Clinical Measurements: Goal: Ability to maintain clinical measurements within normal limits will improve Outcome: Progressing   Problem: Activity: Goal: Risk for activity intolerance will decrease Outcome: Progressing   Problem: Nutrition: Goal: Adequate nutrition will be maintained Outcome: Progressing   Problem: Safety: Goal: Ability to remain free from injury will improve Outcome: Progressing

## 2022-10-21 NOTE — Plan of Care (Signed)

## 2022-10-21 NOTE — Progress Notes (Signed)
[facility] Progress Note  Patient: Joan [name]    ZOX:[id number]  DOA: 10/15/2022     Date of Service: the patient was seen and examined on 10/21/2022  Chief Complaint  Patient presents with   Wound Infection   Brief hospital course: Joan [name] is a 72 y.o. Caucasian female with medical history significant for anxiety, osteoarthritis, depression, GERD, hepatitis C, COPD, hypertension and peripheral vascular disease, who presented to the emergency room with acute onset of bilateral feet pain more on the left than the right leg started with a rash in both feet that is concerning for infection and has been going on for several weeks. PCP had tx steroids, rash worsened to open ulcers, also experiencing cough/wheezing   Last seen in office by vascular surgery 05/21/22 - atherosclerosis w/ intermittent claudication bilateral LE, no invasive studies planned. Hx multiple angio/stent LE 10/2017 - 08/2020, with L femoral endarterectomy 06/22/2020    Hospital course / significant events:  09/23: to ED from home, VSS, unremarkable BMP and CBC. Lactic acid was 1.7. ESR 35, XR 2 view left and right feet x-ray revealed soft tissue swelling without evidence for osteomyelitis. Started ceftriaxone. Pulses audible with Doppler.  09/24: vascular surgery recs - LLE angio w/ possible intervention on 9/25     Consults: Vascular surgery    Procedures/surgeries 10/17/22 LLE drug-coated angioplasty balloon and left SFA atherectomy   Assessment and Plan:  # Septic emboli w/ cellulitis of foot, in setting of vascular ischemia IV abx w/ ceftriaxone Pain management Wound gram stain / culture  Vascular surgery, s/p LLE angio w/ intervention as above done on 9/25   PVD (peripheral vascular disease) (HCC) ASA, plavix, statin S/p LLE angioplasty as above Follow vascular surgery, plan for right lower extremity angiogram on Monday, 10/22/2022 Keep n.p.o. after midnight  COPD with acute exacerbation   Started Breo Ellipta inhaler, continue DuoNeb as needed Use Robitussin DM as needed for cough  GERD without esophagitis PPI   Dyslipidemia Statin   Essential hypertension Continue home losartan  Added metoprolol d/t BP high  Adjust as needed  Use IV hydralazine as needed  Anxiety Home Sertaline scheduled  Alprazolam prn    Body mass index is 32.61 kg/m.  Interventions:  Diet: Heart healthy diet DVT Prophylaxis: Subcutaneous Lovenox   Advance goals of care discussion: DNR  Family Communication: family was not present at bedside, at the time of interview.  The pt provided permission to discuss medical plan with the family. Opportunity was given to ask question and all questions were answered satisfactorily.   Disposition:  Pt is from Home, admitted with PAD and cellulitis, on IV antibiotics and vascular surgery eval for angiogram, which precludes a safe discharge. Discharge to Home, when cleared by vascular surgery.  Subjective: No significant events overnight, patient was eating breakfast, stated that her pain is 3/10, well-controlled.  Patient was informed that right lower extremity angiogram may be tomorrow a.m., we will keep her n.p.o. after midnight.  Physical Exam: General: NAD, lying comfortably Appear in no distress, affect appropriate Eyes: PERRLA ENT: Oral Mucosa Clear, moist  Neck: no JVD,  Cardiovascular: S1 and S2 Present, no Murmur,  Respiratory: Equal air entry bilaterally, mild respiratory distress, no crackles, bilateral wheezing.   Abdomen: Bowel Sound present, Soft and no tenderness,  Skin: no rashes Extremities: Mild pedal edema, no calf tenderness, healing ulcers bilateral feet near to toes Neurologic: without any new focal findings Gait not checked due to patient safety concerns  Vitals:   10/20/22 0822 10/20/22 1514 10/20/22 2306 10/21/22 0714  BP: (!) 142/76 (!) 126/58 112/66 (!) 135/50  Pulse: 71 63 63 60  Resp: 18 18 18 16   Temp: 98  F (36.7 C) 98.3 F (36.8 C) 98.3 F (36.8 C) 98.2 F (36.8 C)  TempSrc:      SpO2: 91% 93% 91% 94%  Weight:      Height:        Intake/Output Summary (Last 24 hours) at 10/21/2022 1346 Last data filed at 10/21/2022 1229 Gross per 24 hour  Intake 480 ml  Output 0 ml  Net 480 ml   Filed Weights   10/15/22 1441  Weight: 86.2 kg    Data Reviewed: I have personally reviewed and interpreted daily labs, tele strips, imagings as discussed above. I reviewed all nursing notes, pharmacy notes, vitals, pertinent old records I have discussed plan of care as described above with RN and patient/family.  CBC: Recent Labs  Lab 10/16/22 0152 10/18/22 0337 10/19/22 0432 10/20/22 0500 10/21/22 0345  WBC 11.0* 9.0 10.3 10.8* 9.8  HGB 12.8 13.2 13.2 12.9 12.1  HCT 40.1 41.3 40.0 38.4 36.5  MCV 90.5 88.6 87.0 87.9 87.3  PLT 263 251 277 270 286   Basic Metabolic Panel: Recent Labs  Lab 10/16/22 0152 10/18/22 0337 10/19/22 0432 10/20/22 0500 10/21/22 0345  NA 138 138 136 137 139  K 4.1 4.0 3.6 3.9 3.8  CL 101 101 100 105 107  CO2 27 26 26 24 25   GLUCOSE 113* 107* 108* 107* 97  BUN 11 11 15 20 19   CREATININE 0.80 0.64 0.70 0.69 0.72  CALCIUM 8.8* 8.9 9.3 8.7* 9.0  MG  --  2.1 2.0 2.1  --   PHOS  --  3.4 4.1 3.9  --     Studies: No results found.  Scheduled Meds:  aspirin EC  81 mg Oral Daily   clopidogrel  75 mg Oral Daily   enoxaparin (LOVENOX) injection  40 mg Subcutaneous QHS   fluticasone furoate-vilanterol  1 puff Inhalation Daily   losartan  50 mg Oral Daily   metoprolol succinate  25 mg Oral Daily   pantoprazole  40 mg Oral Daily   rosuvastatin  5 mg Oral Daily   sertraline  100 mg Oral Daily   Continuous Infusions:  cefTRIAXone (ROCEPHIN)  IV Stopped (10/20/22 2335)   PRN Meds: acetaminophen **OR** acetaminophen, ALPRAZolam, guaiFENesin-dextromethorphan, hydrALAZINE **OR** hydrALAZINE, ipratropium-albuterol, loratadine, magnesium hydroxide, ondansetron  **OR** ondansetron (ZOFRAN) IV, traZODone  Time spent: 35 minutes  Author: Gillis Santa. MD [facility] 10/21/2022 1:46 PM  To reach On-call, see care teams to locate the attending and reach out to them via [email or web address]. If 7PM-7AM, please contact night-coverage If you still have difficulty reaching the attending provider, please page the [facility] (Director on Call) for [facility] on amion for assistance.

## 2022-10-22 ENCOUNTER — Encounter
Admission: EM | Disposition: A | Discharge status: Home Health | Payer: Self-pay | Source: Home / Self Care | Attending: Student

## 2022-10-22 DIAGNOSIS — Z95828 Presence of other vascular implants and grafts: Secondary | ICD-10-CM | POA: Diagnosis not present

## 2022-10-22 DIAGNOSIS — L03119 Cellulitis of unspecified part of limb: Secondary | ICD-10-CM | POA: Diagnosis not present

## 2022-10-22 DIAGNOSIS — L97519 Non-pressure chronic ulcer of other part of right foot with unspecified severity: Secondary | ICD-10-CM | POA: Diagnosis not present

## 2022-10-22 DIAGNOSIS — I70235 Atherosclerosis of native arteries of right leg with ulceration of other part of foot: Secondary | ICD-10-CM | POA: Diagnosis not present

## 2022-10-22 DIAGNOSIS — L089 Local infection of the skin and subcutaneous tissue, unspecified: Secondary | ICD-10-CM | POA: Diagnosis not present

## 2022-10-22 HISTORY — PX: LOWER EXTREMITY ANGIOGRAPHY: CATH118251

## 2022-10-22 LAB — CBC
HCT: 37.7 % (ref 36.0–46.0)
Hemoglobin: 12.3 g/dL (ref 12.0–15.0)
MCH: 28.7 pg (ref 26.0–34.0)
MCHC: 32.6 g/dL (ref 30.0–36.0)
MCV: 88.1 fL (ref 80.0–100.0)
Platelets: 300 10*3/uL (ref 150–400)
RBC: 4.28 MIL/uL (ref 3.87–5.11)
RDW: 14.1 % (ref 11.5–15.5)
WBC: 9.8 10*3/uL (ref 4.0–10.5)
nRBC: 0 % (ref 0.0–0.2)

## 2022-10-22 LAB — BASIC METABOLIC PANEL
Anion gap: 9 (ref 5–15)
BUN: 19 mg/dL (ref 8–23)
CO2: 25 mmol/L (ref 22–32)
Calcium: 9.1 mg/dL (ref 8.9–10.3)
Chloride: 105 mmol/L (ref 98–111)
Creatinine, Ser: 0.71 mg/dL (ref 0.44–1.00)
GFR, Estimated: >60 mL/min (ref >60)
Glucose, Bld: 107 mg/dL — ABNORMAL HIGH (ref 70–99)
Potassium: 3.7 mmol/L (ref 3.5–5.1)
Sodium: 139 mmol/L (ref 135–145)

## 2022-10-22 SURGERY — LOWER EXTREMITY ANGIOGRAPHY
Anesthesia: Moderate Sedation | Laterality: Right

## 2022-10-22 MED ORDER — MIDAZOLAM HCL 2 MG/ML PO SYRP
8.0000 mg | ORAL_SOLUTION | Freq: Once | ORAL | Status: DC | PRN
  Filled 2022-10-22: qty 5

## 2022-10-22 MED ORDER — FENTANYL CITRATE (PF) 100 MCG/2ML IJ SOLN
INTRAMUSCULAR | Status: DC | PRN
  Administered 2022-10-22: 12.5 ug via INTRAVENOUS
  Administered 2022-10-22: 50 ug via INTRAVENOUS
  Administered 2022-10-22: 12.5 ug via INTRAVENOUS

## 2022-10-22 MED ORDER — FENTANYL CITRATE PF 50 MCG/ML IJ SOSY: 12.5000 ug | PREFILLED_SYRINGE | Freq: Once | INTRAMUSCULAR | Status: DC | PRN

## 2022-10-22 MED ORDER — ONDANSETRON HCL 4 MG/2ML IJ SOLN: 4.0000 mg | Freq: Four times a day (QID) | INTRAMUSCULAR | Status: DC | PRN

## 2022-10-22 MED ORDER — LIDOCAINE-EPINEPHRINE (PF) 1 %-1:200000 IJ SOLN
INTRAMUSCULAR | Status: DC | PRN
  Administered 2022-10-22: 10 mL

## 2022-10-22 MED ORDER — FAMOTIDINE 20 MG PO TABS: 40.0000 mg | ORAL_TABLET | Freq: Once | ORAL | Status: DC | PRN

## 2022-10-22 MED ORDER — MIDAZOLAM HCL 5 MG/5ML IJ SOLN
INTRAMUSCULAR | Status: AC
  Filled 2022-10-22: qty 5

## 2022-10-22 MED ORDER — HEPARIN (PORCINE) IN NACL 1000-0.9 UT/500ML-% IV SOLN
INTRAVENOUS | Status: DC | PRN
  Administered 2022-10-22: 1000 mL

## 2022-10-22 MED ORDER — DIPHENHYDRAMINE HCL 50 MG/ML IJ SOLN: 50.0000 mg | Freq: Once | INTRAMUSCULAR | Status: DC | PRN

## 2022-10-22 MED ORDER — SODIUM CHLORIDE 0.9 % IV SOLN: INTRAVENOUS | Status: DC

## 2022-10-22 MED ORDER — MIDAZOLAM HCL 2 MG/2ML IJ SOLN
INTRAMUSCULAR | Status: DC | PRN
  Administered 2022-10-22: 2 mg via INTRAVENOUS
  Administered 2022-10-22 (×2): .5 mg via INTRAVENOUS

## 2022-10-22 MED ORDER — HEPARIN SODIUM (PORCINE) 1000 UNIT/ML IJ SOLN
INTRAMUSCULAR | Status: AC
  Filled 2022-10-22: qty 10

## 2022-10-22 MED ORDER — HYDROMORPHONE HCL 1 MG/ML IJ SOLN: 1.0000 mg | Freq: Once | INTRAMUSCULAR | Status: DC | PRN

## 2022-10-22 MED ORDER — METHYLPREDNISOLONE SODIUM SUCC 125 MG IJ SOLR: 125.0000 mg | Freq: Once | INTRAMUSCULAR | Status: DC | PRN

## 2022-10-22 MED ORDER — HEPARIN SODIUM (PORCINE) 1000 UNIT/ML IJ SOLN
INTRAMUSCULAR | Status: DC | PRN
  Administered 2022-10-22: 4000 [IU] via INTRAVENOUS

## 2022-10-22 MED ORDER — FENTANYL CITRATE (PF) 100 MCG/2ML IJ SOLN
INTRAMUSCULAR | Status: AC
  Filled 2022-10-22: qty 2

## 2022-10-22 MED ORDER — IODIXANOL 320 MG/ML IV SOLN
INTRAVENOUS | Status: DC | PRN
  Administered 2022-10-22: 45 mL

## 2022-10-22 SURGICAL SUPPLY — 19 items
BALLN LUTONIX DCB 5X100X130 (BALLOONS) ×1
BALLN ULTRVRSE 3X100X130C (BALLOONS) ×1
BALLOON LUTONIX DCB 5X100X130 (BALLOONS) IMPLANT
BALLOON ULTRVRSE 3X100X130C (BALLOONS) IMPLANT
CATH ANGIO 5F PIGTAIL 65CM (CATHETERS) IMPLANT
CATH TEMPO 5F RIM 65CM (CATHETERS) IMPLANT
CATH VERT 5FR 125CM (CATHETERS) IMPLANT
COVER PROBE ULTRASOUND 5X96 (MISCELLANEOUS) IMPLANT
DEVICE STARCLOSE SE CLOSURE (Vascular Products) IMPLANT
GLIDEWIRE ADV .035X260CM (WIRE) IMPLANT
GUIDEWIRE AMPLATZ SHORT (WIRE) IMPLANT
KIT ENCORE 26 ADVANTAGE (KITS) IMPLANT
PACK ANGIOGRAPHY (CUSTOM PROCEDURE TRAY) ×1 IMPLANT
SHEATH ANL 5FRX90 (SHEATH) IMPLANT
SHEATH BRITE TIP 4FRX11 (SHEATH) IMPLANT
SHEATH BRITE TIP 5FRX11 (SHEATH) IMPLANT
SYR MEDRAD MARK 7 150ML (SYRINGE) IMPLANT
TUBING CONTRAST HIGH PRESS 72 (TUBING) IMPLANT
WIRE GUIDERIGHT .035X150 (WIRE) IMPLANT

## 2022-10-22 NOTE — Interval H&P Note (Signed)
History and Physical Interval Note:  10/22/2022 9:13 AM  Joan [name]  has presented today for surgery, with the diagnosis of PAD.  The various methods of treatment have been discussed with the patient and family. After consideration of risks, benefits and other options for treatment, the patient has consented to  Procedure(s): Lower Extremity Angiography (Right) as a surgical intervention.  The patient's history has been reviewed, patient examined, no change in status, stable for surgery.  I have reviewed the patient's chart and labs.  Questions were answered to the patient's satisfaction.     Festus Barren

## 2022-10-22 NOTE — Care Management Important Message (Signed)
Important Message  Patient Details  Name: Joan [name] MRN: [id number] Date of Birth: 04/28/1950   Important Message Given:  Yes - Medicare IM     Verita Schneiders [name] 10/22/2022, 11:56 AM

## 2022-10-22 NOTE — Plan of Care (Signed)
  Problem: Education: Goal: Knowledge of General Education information will improve Description: Including pain rating scale, medication(s)/side effects and non-pharmacologic comfort measures Outcome: Progressing   Problem: Health Behavior/Discharge Planning: Goal: Ability to manage health-related needs will improve Outcome: Progressing   Problem: Clinical Measurements: Goal: Will remain free from infection Outcome: Progressing Goal: Diagnostic test results will improve Outcome: Progressing   Problem: Elimination: Goal: Will not experience complications related to bowel motility Outcome: Progressing

## 2022-10-22 NOTE — Plan of Care (Signed)

## 2022-10-22 NOTE — Progress Notes (Signed)
[facility] Progress Note  Patient: Joan [name]    UJW:[id number]  DOA: 10/15/2022     Date of Service: the patient was seen and examined on 10/22/2022  Chief Complaint  Patient presents with   Wound Infection   Brief hospital course: KIYANA VAZGUEZ is a 72 y.o. Caucasian female with medical history significant for anxiety, osteoarthritis, depression, GERD, hepatitis C, COPD, hypertension and peripheral vascular disease, who presented to the emergency room with acute onset of bilateral feet pain more on the left than the right leg started with a rash in both feet that is concerning for infection and has been going on for several weeks. PCP had tx steroids, rash worsened to open ulcers, also experiencing cough/wheezing   Last seen in office by vascular surgery 05/21/22 - atherosclerosis w/ intermittent claudication bilateral LE, no invasive studies planned. Hx multiple angio/stent LE 10/2017 - 08/2020, with L femoral endarterectomy 06/22/2020    Hospital course / significant events:  09/23: to ED from home, VSS, unremarkable BMP and CBC. Lactic acid was 1.7. ESR 35, XR 2 view left and right feet x-ray revealed soft tissue swelling without evidence for osteomyelitis. Started ceftriaxone. Pulses audible with Doppler.  09/24: vascular surgery recs - LLE angio w/ possible intervention on 9/25     Consults: Vascular surgery    Procedures/surgeries 10/17/22 LLE drug-coated angioplasty balloon and left SFA atherectomy  9/30 s/p RLE angioplasty  Assessment and Plan:  # Septic emboli w/ cellulitis of foot, in setting of vascular ischemia IV ceftriaxone completed on 9/29 x 7 days  Pain management Wound gram stain / culture  Vascular surgery, s/p LLE angio w/ intervention as above done on 9/25   PVD (peripheral vascular disease) (HCC) ASA, plavix, statin S/p LLE angioplasty as above S/p RLE angioplasty a sabove Follow vascular surgery for further management  COPD with acute  exacerbation  Started Breo Ellipta inhaler, continue DuoNeb as needed Use Robitussin DM as needed for cough  GERD without esophagitis PPI   Dyslipidemia Statin   Essential hypertension Continue home losartan  Added metoprolol d/t BP high  Adjust as needed  Use IV hydralazine as needed  Anxiety Home Sertaline scheduled  Alprazolam prn    Body mass index is 32.61 kg/m.  Interventions:  Diet: Heart healthy diet DVT Prophylaxis: Subcutaneous Lovenox   Advance goals of care discussion: DNR  Family Communication: family was not present at bedside, at the time of interview.  The pt provided permission to discuss medical plan with the family. Opportunity was given to ask question and all questions were answered satisfactorily.   Disposition:  Pt is from Home, admitted with PAD and cellulitis, on IV antibiotics and vascular surgery eval for angiogram, which precludes a safe discharge. Discharge to Home, when cleared by vascular surgery.  Most likely tomorrow a.m.  Subjective: No significant events overnight, patient was seen after right lower extremity angiogram, tolerated procedure well.  Pain is under control.  Left foot pain 3/10, no nasal complaints.     Physical Exam: General: NAD, lying comfortably Appear in no distress, affect appropriate Eyes: PERRLA ENT: Oral Mucosa Clear, moist  Neck: no JVD,  Cardiovascular: S1 and S2 Present, no Murmur,  Respiratory: Equal air entry bilaterally, mild respiratory distress, no crackles, bilateral wheezing.   Abdomen: Bowel Sound present, Soft and no tenderness,  Skin: no rashes Extremities: Mild pedal edema, no calf tenderness, healing ulcers bilateral feet near to toes Neurologic: without any new focal findings Gait not checked due  to patient safety concerns  Vitals:   10/22/22 1014 10/22/22 1016 10/22/22 1038 10/22/22 1059  BP: 124/67 122/70 (!) 138/56 138/82  Pulse: (!) 0  (!) 58 61  Resp:  19 16 19   Temp:      TempSrc:       SpO2:  (!) 86% 91% 95%  Weight:      Height:        Intake/Output Summary (Last 24 hours) at 10/22/2022 1257 Last data filed at 10/21/2022 1650 Gross per 24 hour  Intake 240 ml  Output --  Net 240 ml   Filed Weights   10/15/22 1441  Weight: 86.2 kg    Data Reviewed: I have personally reviewed and interpreted daily labs, tele strips, imagings as discussed above. I reviewed all nursing notes, pharmacy notes, vitals, pertinent old records I have discussed plan of care as described above with RN and patient/family.  CBC: Recent Labs  Lab 10/18/22 0337 10/19/22 0432 10/20/22 0500 10/21/22 0345 10/22/22 0614  WBC 9.0 10.3 10.8* 9.8 9.8  HGB 13.2 13.2 12.9 12.1 12.3  HCT 41.3 40.0 38.4 36.5 37.7  MCV 88.6 87.0 87.9 87.3 88.1  PLT 251 277 270 286 300   Basic Metabolic Panel: Recent Labs  Lab 10/18/22 0337 10/19/22 0432 10/20/22 0500 10/21/22 0345 10/22/22 0614  NA 138 136 137 139 139  K 4.0 3.6 3.9 3.8 3.7  CL 101 100 105 107 105  CO2 26 26 24 25 25   GLUCOSE 107* 108* 107* 97 107*  BUN 11 15 20 19 19   CREATININE 0.64 0.70 0.69 0.72 0.71  CALCIUM 8.9 9.3 8.7* 9.0 9.1  MG 2.1 2.0 2.1  --   --   PHOS 3.4 4.1 3.9  --   --     Studies: PERIPHERAL VASCULAR CATHETERIZATION  Result Date: 10/22/2022 See surgical note for result.   Scheduled Meds:  aspirin EC  81 mg Oral Daily   clopidogrel  75 mg Oral Daily   enoxaparin (LOVENOX) injection  40 mg Subcutaneous QHS   fluticasone furoate-vilanterol  1 puff Inhalation Daily   losartan  50 mg Oral Daily   metoprolol succinate  25 mg Oral Daily   pantoprazole  40 mg Oral Daily   rosuvastatin  5 mg Oral Daily   sertraline  100 mg Oral Daily   Continuous Infusions:   PRN Meds: acetaminophen **OR** acetaminophen, ALPRAZolam, guaiFENesin-dextromethorphan, hydrALAZINE **OR** hydrALAZINE, ipratropium-albuterol, loratadine, magnesium hydroxide, ondansetron **OR** ondansetron (ZOFRAN) IV, traZODone  Time spent: 35  minutes  Author: Gillis [name]. MD [facility] 10/22/2022 12:57 PM  To reach On-call, see care teams to locate the attending and reach out to them via [email or web address]. If 7PM-7AM, please contact night-coverage If you still have difficulty reaching the attending provider, please page the [facility] (Director on Call) for [facility] on amion for assistance.

## 2022-10-22 NOTE — Op Note (Signed)
[facility]  Percutaneous Study/Intervention Procedural Note   Date of Surgery: 10/22/2022  Surgeon(s):Rubee Vega    Assistants:none  Pre-operative Diagnosis: PAD with ulceration RLE  Post-operative diagnosis:  Same  Procedure(s) Performed:             1.  Ultrasound guidance for vascular access left femoral artery             2.  Catheter placement into right common femoral artery from left femoral approach             3.  Aortogram and selective right lower extremity angiogram             4.  Percutaneous transluminal angioplasty of right anterior tibial artery with 3 mm diameter angioplasty balloon             5.  Percutaneous transluminal angioplasty of right popliteal artery with 5 mm diameter Lutonix drug-coated angioplasty balloon  6.  StarClose closure device left femoral artery  EBL: 5 cc  Contrast: 45 cc  Fluoro Time: 3 minutes  Moderate Conscious Sedation Time: approximately 34 minutes using 3 mg of Versed and 75 mcg of Fentanyl              Indications:  Patient is a 72 y.o.female with infected ulcerations on both feet.  She is status post revascularization of the left lower extremity recently and multiple previous interventions and femoral endarterectomies. The patient is brought in for angiography for further evaluation and potential treatment.  Due to the limb threatening nature of the situation, angiogram was performed for attempted limb salvage. The patient is aware that if the procedure fails, amputation would be expected.  The patient also understands that even with successful revascularization, amputation may still be required due to the severity of the situation.  Risks and benefits are discussed and informed consent is obtained.   Procedure:  The patient was identified and appropriate procedural time out was performed.  The patient was then placed supine on the table and prepped and draped in the usual sterile fashion. Moderate conscious  sedation was administered during a face to face encounter with the patient throughout the procedure with my supervision of the RN administering medicines and monitoring the patient's vital signs, pulse oximetry, telemetry and mental status throughout from the start of the procedure until the patient was taken to the recovery room. Ultrasound was used to evaluate the left common femoral artery.  It was patent .  A digital ultrasound image was acquired.  A Seldinger needle was used to access the left common femoral artery under direct ultrasound guidance and a permanent image was performed.  A 0.035 J wire was advanced without resistance and a 5Fr sheath was placed.  Pigtail catheter was placed into the aorta and an AP aortogram was performed.  This is performed for further visualization as well as difficult location of her iliac stents to help cross the bifurcation.  This demonstrated normal renal arteries and normal aorta and iliac segments without significant stenosis. I then crossed the aortic bifurcation and advanced to the right femoral head. Selective right lower extremity angiogram was then performed. This demonstrated her right common femoral artery, profunda femoris artery, and SFA including previously placed SFA stents were all patent.  There was a moderate stenosis just below the stents in the popliteal artery in the 55 to 60% range.  The anterior tibial artery had about an 80 to 90% stenosis in the proximal segment.  The  peroneal artery and posterior tibial artery were continuous without focal stenosis. It was felt that it was in the patient's best interest to proceed with intervention after these images to avoid a second procedure and a larger amount of contrast and fluoroscopy based off of the findings from the initial angiogram. The patient was systemically heparinized and a 5 [place] [employer] sheath was then placed over the [employer] wire. I then used a Kumpe catheter and the advantage wire to  navigate through the popliteal and anterior tibial stenoses without difficulty and parked the wire at the ankle.  I then proceeded with angioplasty.  A 3 mm diameter by 10 cm length angioplasty balloon was inflated to 6 atm for 1 minute right anterior tibial artery.  A 5 mm diameter by 10 cm length Lutonix drug-coated angioplasty balloon was inflated in the right popliteal artery and taken up to 10 atm for 1 minute.  Completion imaging showed marked improvement in both locations.  There was about a 20% residual stenosis in the popliteal artery and about a 25% residual stenosis in the proximal anterior tibial artery.  The more distal anterior tibial artery did have some spasm from the large wire, but there was no stenosis before wire placement so we did not do anything to address this and this will resolve with conclusion of the procedure. I elected to terminate the procedure. The sheath was removed and StarClose closure device was deployed in the left femoral artery with excellent hemostatic result. The patient was taken to the recovery room in stable condition having tolerated the procedure well.  Findings:               Aortogram:  This demonstrated normal renal arteries and normal aorta and iliac segments without significant stenosis after extensive previous stenting.              Right Lower Extremity:  This demonstrated normal renal arteries and normal aorta and iliac segments without significant stenosis after extensive previous stenting.    Disposition: Patient was taken to the recovery room in stable condition having tolerated the procedure well.  Complications: None  Festus Barren 10/22/2022 10:12 AM   This note was created with Dragon Medical transcription system. Any errors in dictation are purely unintentional.

## 2022-10-22 NOTE — Progress Notes (Signed)
  Progress Note    10/22/2022 9:23 AM Day of Surgery  Subjective:  Joan [name] is a 72 yo female who is now POD#2 from left lower extremity angiogram with arthrectomy of SFA and angioplasty.  Patient is resting comfortably in bed this morning.  She has no complaints.  Patient endorses she feels that the sores to her left lower extremity are getting better.  She endorses having less pain but there is still some pain to her foot.  No complaints overnight vitals all remained stable.    Vitals:   10/22/22 0908 10/22/22 0911  BP: (!) 152/137 (!) 148/52  Pulse: 66   Resp: (!) 21   Temp:  98.2 F (36.8 C)  SpO2: 90%    Physical Exam: Cardiac:  RRR, normal S1-S2.  No murmurs appreciated. Lungs: Clear on auscultation throughout, no rales rhonchi or wheezing noted. Incisions: Right groin with dressing intact.  Clean and dry no hematoma seroma to note. Extremities: Bilateral lower extremities remain with septic emboli attic sores.  Both lower extremities warm to touch.  Doppler pulses strong. Abdomen: Positive bowel sounds throughout, soft, nontender nondistended. Neurologic: Alert and oriented x 4, follows commands and answers all questions appropriately.  CBC    Component Value Date/Time   WBC 9.8 10/22/2022 0614   RBC 4.28 10/22/2022 0614   HGB 12.3 10/22/2022 0614   HCT 37.7 10/22/2022 0614   PLT 300 10/22/2022 0614   MCV 88.1 10/22/2022 0614   MCH 28.7 10/22/2022 0614   MCHC 32.6 10/22/2022 0614   RDW 14.1 10/22/2022 0614   LYMPHSABS 2.5 09/18/2021 0807   MONOABS 0.7 09/18/2021 0807   EOSABS 0.5 09/18/2021 0807   BASOSABS 0.1 09/18/2021 0807    BMET    Component Value Date/Time   NA 139 10/22/2022 0614   K 3.7 10/22/2022 0614   CL 105 10/22/2022 0614   CO2 25 10/22/2022 0614   GLUCOSE 107 (H) 10/22/2022 0614   BUN 19 10/22/2022 0614   CREATININE 0.71 10/22/2022 0614   CALCIUM 9.1 10/22/2022 0614   GFRNONAA >60 10/22/2022 0614   GFRAA >60 11/12/2017 0948    INR     Component Value Date/Time   INR 1.2 09/15/2021 0644     Intake/Output Summary (Last 24 hours) at 10/22/2022 0923 Last data filed at 10/21/2022 1650 Gross per 24 hour  Intake 480 ml  Output --  Net 480 ml     Assessment/Plan:  72 y.o. female is s/p left lower extremity angiogram with arthrectomy of SFA and angioplasty.  Day of Surgery   Continue antibiotics as patient's septic emboli appear to be healing well. Plan is to take the patient to the vascular lab again on today for right lower extremity angiogram with possible intervention.   I discussed in detail with the patient the procedure, risk, benefits, and complications.  She verbalized her understanding.  I answered all the patient's questions this morning.  She wishes to proceed on this morning.  Patient was made n.p.o. after midnight Sunday night into this morning.     DVT prophylaxis: ASA 81 mg daily, Plavix 75 mg daily, Crestor 5 mg daily.  Lovenox 42.5 mg every 24 hours   Marcie [facility] 10/22/2022 9:23 AM

## 2022-10-23 ENCOUNTER — Encounter: Payer: Self-pay | Admitting: Vascular Surgery

## 2022-10-23 DIAGNOSIS — L03119 Cellulitis of unspecified part of limb: Secondary | ICD-10-CM | POA: Diagnosis not present

## 2022-10-23 DIAGNOSIS — I739 Peripheral vascular disease, unspecified: Secondary | ICD-10-CM | POA: Diagnosis not present

## 2022-10-23 LAB — CBC
HCT: 39.2 % (ref 36.0–46.0)
Hemoglobin: 13 g/dL (ref 12.0–15.0)
MCH: 28.6 pg (ref 26.0–34.0)
MCHC: 33.2 g/dL (ref 30.0–36.0)
MCV: 86.2 fL (ref 80.0–100.0)
Platelets: 309 10*3/uL (ref 150–400)
RBC: 4.55 MIL/uL (ref 3.87–5.11)
RDW: 13.9 % (ref 11.5–15.5)
WBC: 11.2 10*3/uL — ABNORMAL HIGH (ref 4.0–10.5)
nRBC: 0 % (ref 0.0–0.2)

## 2022-10-23 LAB — BASIC METABOLIC PANEL
Anion gap: 8 (ref 5–15)
BUN: 18 mg/dL (ref 8–23)
CO2: 26 mmol/L (ref 22–32)
Calcium: 8.9 mg/dL (ref 8.9–10.3)
Chloride: 103 mmol/L (ref 98–111)
Creatinine, Ser: 0.66 mg/dL (ref 0.44–1.00)
GFR, Estimated: >60 mL/min (ref >60)
Glucose, Bld: 104 mg/dL — ABNORMAL HIGH (ref 70–99)
Potassium: 3.9 mmol/L (ref 3.5–5.1)
Sodium: 137 mmol/L (ref 135–145)

## 2022-10-23 MED ORDER — FLUTICASONE FUROATE-VILANTEROL 200-25 MCG/ACT IN AEPB: 1.0000 | INHALATION_SPRAY | Freq: Every day | RESPIRATORY_TRACT | 11 refills | Status: AC

## 2022-10-23 MED ORDER — PANTOPRAZOLE SODIUM 40 MG PO TBEC: 40.0000 mg | DELAYED_RELEASE_TABLET | Freq: Every day | ORAL | 2 refills | Status: AC

## 2022-10-23 MED ORDER — CLOPIDOGREL BISULFATE 75 MG PO TABS: 75.0000 mg | ORAL_TABLET | Freq: Every day | ORAL | 3 refills | Status: AC

## 2022-10-23 MED ORDER — ASPIRIN EC 81 MG PO TBEC: 81.0000 mg | DELAYED_RELEASE_TABLET | Freq: Every day | ORAL | 3 refills | Status: AC

## 2022-10-23 MED ORDER — AMLODIPINE BESYLATE 10 MG PO TABS
10.0000 mg | ORAL_TABLET | Freq: Every day | ORAL | 3 refills | Status: AC
Start: 2022-10-23 — End: 2023-10-23

## 2022-10-23 NOTE — Plan of Care (Signed)
  Problem: Education: Goal: Knowledge of General Education information will improve Description: Including pain rating scale, medication(s)/side effects and non-pharmacologic comfort measures Outcome: Progressing   Problem: Clinical Measurements: Goal: Will remain free from infection Outcome: Progressing   

## 2022-10-23 NOTE — Discharge Summary (Signed)
[facility] Discharge Summary   Patient: Joan [name]:[id number]  PCP: Barbette Reichmann, MD  Date of admission: 10/15/2022   Date of discharge:  10/23/2022     Discharge Diagnoses:  Principal Problem:   Cellulitis of lower extremity Active Problems:   PVD (peripheral vascular disease) (HCC)   COPD with acute exacerbation (HCC)   Dyslipidemia   GERD without esophagitis   Essential hypertension   Admitted From: Home Disposition:  Home   Recommendations for Outpatient Follow-up:  Follow-up with PCP in 1 week, continue to monitor BP at home and follow with PCP to titrate medication accordingly.  Started amlodipine 10 mg p.o. daily with holding parameters to skip the dose if SBP less than 130 mmHg. Follow-up with vascular surgery in 1 to 2 weeks Follow up LABS/TEST:     Diet recommendation: Cardiac diet  Activity: The patient is advised to gradually reintroduce usual activities, as tolerated  Discharge Condition: stable  Code Status: Full code   History of present illness: As per the H and P dictated on admission Hospital Course:  Joan [name] is a 72 y.o. Caucasian female with medical history significant for anxiety, osteoarthritis, depression, GERD, hepatitis C, COPD, hypertension and peripheral vascular disease, who presented to the emergency room with acute onset of bilateral feet pain more on the left than the right leg started with a rash in both feet that is concerning for infection and has been going on for several weeks. PCP had tx steroids, rash worsened to open ulcers, also experiencing cough/wheezing   Last seen in office by vascular surgery 05/21/22 - atherosclerosis w/ intermittent claudication bilateral LE, no invasive studies planned. Hx multiple angio/stent LE 10/2017 - 08/2020, with L femoral endarterectomy 06/22/2020    Hospital course / significant events:  09/23: to ED from home, VSS, unremarkable BMP and CBC. Lactic acid was 1.7. ESR 35, XR 2  view left and right feet x-ray revealed soft tissue swelling without evidence for osteomyelitis. Started ceftriaxone. Pulses audible with Doppler.  10/17/22 LLE drug-coated angioplasty balloon and left SFA atherectomy  9/30 s/p RLE angioplasty  Assessment and Plan:   # Septic emboli w/ cellulitis of foot, in setting of vascular ischemia S/p IV ceftriaxone completed on 9/29 x 7 days. Continue prn Pain management. Vascular surgery was consulted and angioplasty was done bilaterally as below.  Cellulitis is improving.  No need of more antibiotics. # PVD (peripheral vascular disease) (HCC) Continue ASA, plavix, statin.  Vascular surgery consulted, S/p bilateral lower extremity angioplasty as above.  Patient tolerated procedure well.  Pain improved.  Patient was cleared by vascular surgery to discharge and follow-up as an outpatient. # COPD with acute exacerbation, Started Breo Ellipta inhaler, s/p DuoNeb and Robitussin DM as needed. Continue albuterol inhaler as needed. # GERD without esophagitis.  Continue PPI # Dyslipidemia, continue Statin # Essential hypertension, Continue home losartan, s/p metoprolol given during hospital stay but patient had low heart rate so it was discontinued and started amlodipine 10 mg p.o. daily with holding parameters.  Patient was advised to monitor BP at home and follow with PCP to titrate medication accordingly. # Anxiety, resumed Zoloft and Xanax home meds.  Body mass index is 32.61 kg/m.  Nutrition Interventions:  - Patient was instructed, not to drive, operate heavy machinery, perform activities at heights, swimming or participation in water activities or provide baby sitting services while on Pain, Sleep and Anxiety Medications; until her outpatient Physician has advised to do so again.  -  Also recommended to not to take more than prescribed Pain, Sleep and Anxiety Medications.  Patient was ambulatory without any assistance. On the day of the discharge the  patient's vitals were stable, and no other acute medical condition were reported by patient. the patient was felt safe to be discharge at Home.  Consultants: Vascular surgery  Procedures:  10/17/22 LLE drug-coated angioplasty balloon and left SFA atherectomy  9/30 s/p RLE angioplasty  Discharge Exam: General: Appear in no distress, no Rash; Oral Mucosa Clear, moist. Cardiovascular: S1 and S2 Present, no Murmur, Respiratory: normal respiratory effort, Bilateral Air entry present and no Crackles, no wheezes Abdomen: Bowel Sound present, Soft and no tenderness, no hernia Extremities: no Pedal edema, no calf tenderness Neurology: alert and oriented to time, place, and person affect appropriate.  Filed Weights   10/15/22 1441  Weight: 86.2 kg   Vitals:   10/23/22 0014 10/23/22 0747  BP: (!) 158/68 (!) 160/84  Pulse:  70  Resp:  14  Temp:  97.6 F (36.4 C)  SpO2:  92%    DISCHARGE MEDICATION: Allergies as of 10/23/2022       Reactions   Seasonal Ic [cholestatin]    Seasonal allergies        Medication List     TAKE these medications    acetaminophen 325 MG tablet Commonly known as: TYLENOL Take 1-2 tablets (325-650 mg total) by mouth every 4 (four) hours as needed for mild pain (or temp >/= 101 F).   albuterol 108 (90 Base) MCG/ACT inhaler Commonly known as: VENTOLIN HFA Inhale 2 puffs into the lungs every 6 (six) hours as needed for wheezing or shortness of breath.   ALPRAZolam 0.5 MG tablet Commonly known as: XANAX Take 0.5 mg by mouth 2 (two) times daily as needed for anxiety.   amLODipine 10 MG tablet Commonly known as: NORVASC Take 1 tablet (10 mg total) by mouth daily. Skip the dose if Systolic BP less than 130 mmHg   aspirin EC 81 MG tablet Take 1 tablet (81 mg total) by mouth daily.   clopidogrel 75 MG tablet Commonly known as: Plavix Take 1 tablet (75 mg total) by mouth daily.   fluticasone furoate-vilanterol 200-25 MCG/ACT Aepb Commonly known as:  BREO ELLIPTA Inhale 1 puff into the lungs daily. Start taking on: October 24, 2022   loratadine 10 MG tablet Commonly known as: CLARITIN Take 10 mg by mouth daily as needed for allergies.   losartan 50 MG tablet Commonly known as: COZAAR Take 50 mg by mouth daily.   pantoprazole 40 MG tablet Commonly known as: PROTONIX Take 1 tablet (40 mg total) by mouth daily.   rosuvastatin 5 MG tablet Commonly known as: CRESTOR Take 5 mg by mouth daily.   sertraline 100 MG tablet Commonly known as: ZOLOFT Take 100 mg by mouth daily.       Allergies  Allergen Reactions   Seasonal Ic [Cholestatin]     Seasonal allergies   Discharge Instructions     Call MD for:  difficulty breathing, headache or visual disturbances   Complete by: As directed    Call MD for:  extreme fatigue   Complete by: As directed    Call MD for:  persistant dizziness or light-headedness   Complete by: As directed    Call MD for:  severe uncontrolled pain   Complete by: As directed    Call MD for:  temperature >100.4   Complete by: As directed    Diet -  low sodium heart healthy   Complete by: As directed    Discharge instructions   Complete by: As directed    Increase activity slowly   Complete by: As directed    Follow-up with PCP in 1 week, continue to monitor BP at home and follow with PCP to titrate medication accordingly.  Started amlodipine 10 mg p.o. daily with holding parameters to skip the dose if SBP less than 130 mmHg. Follow-up with vascular surgery in 1 to 2 weeks       The results of significant diagnostics from this hospitalization (including imaging, microbiology, ancillary and laboratory) are listed below for reference.    Significant Diagnostic Studies: PERIPHERAL VASCULAR CATHETERIZATION  Result Date: 10/22/2022 See surgical note for result.  PERIPHERAL VASCULAR CATHETERIZATION  Result Date: 10/17/2022 See surgical note for result.  DG Foot 2 Views Left  Result Date:  10/15/2022 CLINICAL DATA:  Wound infection EXAM: LEFT FOOT - 2 VIEW COMPARISON:  None Available. FINDINGS: There is diffuse soft tissue swelling of the foot. There is no radiopaque foreign body. There is no acute fracture, dislocation or cortical erosion. Plantar calcaneal spur is present. There is mild hallux valgus. IMPRESSION: 1. Diffuse soft tissue swelling of the foot. No acute osseous abnormality. 2. Mild hallux valgus. Electronically Signed   By: Darliss Cheney M.D.   On: 10/15/2022 23:35   DG Foot 2 Views Right  Result Date: 10/15/2022 CLINICAL DATA:  Wound infection. EXAM: RIGHT FOOT - 2 VIEW COMPARISON:  None Available. FINDINGS: There is soft tissue swelling over the dorsum of the foot. There is no foreign body. There is no acute fracture or dislocation identified. No cortical erosions are seen. IMPRESSION: Soft tissue swelling over the dorsum of the foot. No acute fracture or dislocation. Electronically Signed   By: Darliss Cheney M.D.   On: 10/15/2022 23:34    Microbiology: No results found for this or any previous visit (from the past 240 hour(s)).   Labs: CBC: Recent Labs  Lab 10/19/22 0432 10/20/22 0500 10/21/22 0345 10/22/22 0614 10/23/22 0445  WBC 10.3 10.8* 9.8 9.8 11.2*  HGB 13.2 12.9 12.1 12.3 13.0  HCT 40.0 38.4 36.5 37.7 39.2  MCV 87.0 87.9 87.3 88.1 86.2  PLT 277 270 286 300 309   Basic Metabolic Panel: Recent Labs  Lab 10/18/22 0337 10/19/22 0432 10/20/22 0500 10/21/22 0345 10/22/22 0614 10/23/22 0445  NA 138 136 137 139 139 137  K 4.0 3.6 3.9 3.8 3.7 3.9  CL 101 100 105 107 105 103  CO2 26 26 24 25 25 26   GLUCOSE 107* 108* 107* 97 107* 104*  BUN 11 15 20 19 19 18   CREATININE 0.64 0.70 0.69 0.72 0.71 0.66  CALCIUM 8.9 9.3 8.7* 9.0 9.1 8.9  MG 2.1 2.0 2.1  --   --   --   PHOS 3.4 4.1 3.9  --   --   --    Liver Function Tests: No results for input(s): "AST", "ALT", "ALKPHOS", "BILITOT", "PROT", "ALBUMIN" in the last 168 hours. No results for  input(s): "LIPASE", "AMYLASE" in the last 168 hours. No results for input(s): "AMMONIA" in the last 168 hours. Cardiac Enzymes: No results for input(s): "CKTOTAL", "CKMB", "CKMBINDEX", "TROPONINI" in the last 168 hours. BNP (last 3 results) No results for input(s): "BNP" in the last 8760 hours. CBG: No results for input(s): "GLUCAP" in the last 168 hours.  Time spent: 35 minutes  Signed:  Gillis Santa  [facility] 10/23/2022 11:10 AM

## 2022-10-23 NOTE — Progress Notes (Signed)
  Progress Note    10/23/2022 9:55 AM 1 Day Post-Op  Subjective:  Joan [name] is a 72 yo female who is now POD#1 from right lower extremity angiogram with arthrectomy of SFA and angioplasty. Patient is resting comfortably in bed this morning. She has no complaints. Patient endorses she feels that the sores to her both lower extremity are getting better. She endorses having less pain but there is still some pain to her foot. No complaints overnight vitals all remained stable.    Vitals:   10/23/22 0014 10/23/22 0747  BP: (!) 158/68 (!) 160/84  Pulse:  70  Resp:  14  Temp:  97.6 F (36.4 C)  SpO2:  92%   Physical Exam: Cardiac:  RRR, normal S1-S2.  No murmurs appreciated. Lungs: Clear on auscultation throughout, no rales rhonchi or wheezing noted. Incisions: Right groin with dressing intact.  Clean and dry no hematoma seroma to note. Extremities: Bilateral lower extremities remain with septic emboli attic sores.  Both lower extremities warm to touch.  Doppler pulses strong. Abdomen: Positive bowel sounds throughout, soft, nontender nondistended. Neurologic: Alert and oriented x 4, follows commands and answers all questions appropriately.    CBC    Component Value Date/Time   WBC 11.2 (H) 10/23/2022 0445   RBC 4.55 10/23/2022 0445   HGB 13.0 10/23/2022 0445   HCT 39.2 10/23/2022 0445   PLT 309 10/23/2022 0445   MCV 86.2 10/23/2022 0445   MCH 28.6 10/23/2022 0445   MCHC 33.2 10/23/2022 0445   RDW 13.9 10/23/2022 0445   LYMPHSABS 2.5 09/18/2021 0807   MONOABS 0.7 09/18/2021 0807   EOSABS 0.5 09/18/2021 0807   BASOSABS 0.1 09/18/2021 0807    BMET    Component Value Date/Time   NA 137 10/23/2022 0445   K 3.9 10/23/2022 0445   CL 103 10/23/2022 0445   CO2 26 10/23/2022 0445   GLUCOSE 104 (H) 10/23/2022 0445   BUN 18 10/23/2022 0445   CREATININE 0.66 10/23/2022 0445   CALCIUM 8.9 10/23/2022 0445   GFRNONAA >60 10/23/2022 0445   GFRAA >60 11/12/2017 0948    INR     Component Value Date/Time   INR 1.2 09/15/2021 0644     Intake/Output Summary (Last 24 hours) at 10/23/2022 0955 Last data filed at 10/23/2022 0410 Gross per 24 hour  Intake 240 ml  Output 0 ml  Net 240 ml     Assessment/Plan:  72 y.o. female is s/p right lower extremity angiogram with tibial artery angioplasty.  1 Day Post-Op   Okay per vascular surgery for patient to be discharged home today. Patient will need to be discharged on ASA 81 mg daily and plavix 75 mg daily and crestor 5 mg daily.   DVT prophylaxis:  Lovenox 42.5 mg SQ Q 24 hrs   Breely Panik R Riggins [facility] 10/23/2022 9:55 AM

## 2022-10-31 DIAGNOSIS — L03116 Cellulitis of left lower limb: Secondary | ICD-10-CM | POA: Diagnosis not present

## 2022-10-31 DIAGNOSIS — Z09 Encounter for follow-up examination after completed treatment for conditions other than malignant neoplasm: Secondary | ICD-10-CM | POA: Diagnosis not present

## 2022-10-31 DIAGNOSIS — Z87891 Personal history of nicotine dependence: Secondary | ICD-10-CM | POA: Diagnosis not present

## 2022-10-31 DIAGNOSIS — Z2821 Immunization not carried out because of patient refusal: Secondary | ICD-10-CM | POA: Diagnosis not present

## 2022-10-31 DIAGNOSIS — I739 Peripheral vascular disease, unspecified: Secondary | ICD-10-CM | POA: Diagnosis not present

## 2022-10-31 DIAGNOSIS — F32A Depression, unspecified: Secondary | ICD-10-CM | POA: Diagnosis not present

## 2022-10-31 DIAGNOSIS — J449 Chronic obstructive pulmonary disease, unspecified: Secondary | ICD-10-CM | POA: Diagnosis not present

## 2022-11-15 ENCOUNTER — Other Ambulatory Visit (INDEPENDENT_AMBULATORY_CARE_PROVIDER_SITE_OTHER): Payer: Self-pay | Admitting: Vascular Surgery

## 2022-11-15 DIAGNOSIS — I10 Essential (primary) hypertension: Secondary | ICD-10-CM | POA: Diagnosis not present

## 2022-11-15 DIAGNOSIS — D3501 Benign neoplasm of right adrenal gland: Secondary | ICD-10-CM | POA: Diagnosis not present

## 2022-11-15 DIAGNOSIS — Z6833 Body mass index (BMI) 33.0-33.9, adult: Secondary | ICD-10-CM | POA: Diagnosis not present

## 2022-11-15 DIAGNOSIS — D3502 Benign neoplasm of left adrenal gland: Secondary | ICD-10-CM | POA: Diagnosis not present

## 2022-11-15 DIAGNOSIS — R7303 Prediabetes: Secondary | ICD-10-CM | POA: Diagnosis not present

## 2022-11-15 DIAGNOSIS — I739 Peripheral vascular disease, unspecified: Secondary | ICD-10-CM

## 2022-11-20 ENCOUNTER — Encounter (INDEPENDENT_AMBULATORY_CARE_PROVIDER_SITE_OTHER): Payer: Self-pay | Admitting: Nurse Practitioner

## 2022-11-20 ENCOUNTER — Ambulatory Visit (INDEPENDENT_AMBULATORY_CARE_PROVIDER_SITE_OTHER): Payer: Medicare HMO

## 2022-11-20 ENCOUNTER — Ambulatory Visit (INDEPENDENT_AMBULATORY_CARE_PROVIDER_SITE_OTHER): Payer: Medicare HMO | Admitting: Nurse Practitioner

## 2022-11-20 VITALS — BP 144/80 | HR 63 | Resp 18 | Ht 64.0 in | Wt 191.4 lb

## 2022-11-20 DIAGNOSIS — I739 Peripheral vascular disease, unspecified: Secondary | ICD-10-CM | POA: Diagnosis not present

## 2022-11-20 DIAGNOSIS — Z9889 Other specified postprocedural states: Secondary | ICD-10-CM

## 2022-11-20 DIAGNOSIS — I10 Essential (primary) hypertension: Secondary | ICD-10-CM

## 2022-11-20 DIAGNOSIS — D3501 Benign neoplasm of right adrenal gland: Secondary | ICD-10-CM | POA: Diagnosis not present

## 2022-11-20 DIAGNOSIS — I70213 Atherosclerosis of native arteries of extremities with intermittent claudication, bilateral legs: Secondary | ICD-10-CM

## 2022-11-20 DIAGNOSIS — D3502 Benign neoplasm of left adrenal gland: Secondary | ICD-10-CM | POA: Diagnosis not present

## 2022-11-22 LAB — VAS US ABI WITH/WO TBI
Left ABI: 0.9
Right ABI: 1.05

## 2022-11-25 NOTE — Progress Notes (Signed)
Subjective:    Patient ID: Joan [name], female    DOB: 07-Dec-1950, 72 y.o.   MRN: [id number] Chief Complaint  Patient presents with   Follow-up    6 month ABI    The patient returns to the office for followup and review status post angiogram with intervention on 10/17/2022 10/22/2022 due to lower extremity wounds.  Procedure: 10/17/2022 Procedure(s) Performed:             1.  Ultrasound guidance for vascular access right femoral artery             2.  Catheter placement into left common femoral artery from right femoral approach             3.  Aortogram and selective left lower extremity angiogram             4.  Percutaneous transluminal angioplasty of left tibioperoneal trunk and popliteal artery with 4 mm diameter by 10 cm length Lutonix drug-coated angioplasty balloon             5.  Atherectomy of left SFA with the [place] device             6.  Percutaneous transluminal angioplasty of the proximal to mid left SFA with 5 mm diameter by 6 cm length Lutonix drug-coated angioplasty balloon             7.  StarClose closure device right femoral artery    10/22/2022 Procedure(s) Performed:             1.  Ultrasound guidance for vascular access left femoral artery             2.  Catheter placement into right common femoral artery from left femoral approach             3.  Aortogram and selective right lower extremity angiogram             4.  Percutaneous transluminal angioplasty of right anterior tibial artery with 3 mm diameter angioplasty balloon             5.  Percutaneous transluminal angioplasty of right popliteal artery with 5 mm diameter Lutonix drug-coated angioplasty balloon             6.  StarClose closure device left femoral artery   The patient notes improvement in the lower extremity symptoms. No interval shortening of the patient's claudication distance or rest pain symptoms. No new ulcers or wounds have occurred since the last visit.  The previous wounds have  healed.  There have been no significant changes to the patient's overall health care.  No documented history of amaurosis fugax or recent TIA symptoms. There are no recent neurological changes noted. No documented history of DVT, PE or superficial thrombophlebitis. The patient denies recent episodes of angina or shortness of breath.   ABI's Rt=1.05 and Lt=0.90  (previous ABI's Rt=0.90 and Lt=0.65) The patient has biphasic waveforms in the bilateral tibial arteries.  There are good toe waveforms bilaterally    Review of Systems  Skin:  Negative for wound.  All other systems reviewed and are negative.      Objective:   Physical Exam Vitals reviewed.  HENT:     Head: Normocephalic.  Cardiovascular:     Rate and Rhythm: Normal rate.     Pulses:          Dorsalis pedis pulses are detected w/ Doppler on the right side and  detected w/ Doppler on the left side.       Posterior tibial pulses are detected w/ Doppler on the right side and detected w/ Doppler on the left side.  Pulmonary:     Effort: Pulmonary effort is normal.  Skin:    General: Skin is warm and dry.  Neurological:     Mental Status: She is alert and oriented to person, place, and time.  Psychiatric:        Mood and Affect: Mood normal.        Behavior: Behavior normal.        Thought Content: Thought content normal.     BP (!) 144/80 (BP Location: Right Arm)   Pulse 63   Resp 18   Ht 5\' 4"  (1.626 m)   Wt 191 lb 6.4 oz (86.8 kg)   BMI 32.85 kg/m   Past Medical History:  Diagnosis Date   Angina at rest [facility])    Anxiety    Arthritis    Breast cancer, right breast (HCC)    s/p lumpectomy and adjuvant XRT   Bronchitis    Cancer (HCC)    Claudication, intermittent (HCC)    Depression    Dyspnea    Environmental and seasonal allergies    GERD (gastroesophageal reflux disease)    with spicy food   Hepatitis C antibody test positive    Hypertension    Peripheral vascular disease (HCC)    Personal  history of radiation therapy 2002   RIGHT lumpectomy   TIA (transient ischemic attack)    no residual. Many years ago    Social History   Socioeconomic History   Marital status: Single    Spouse name: Not on file   Number of children: Not on file   Years of education: Not on file   Highest education level: Not on file  Occupational History   Occupation: build harnesses for boats , large items  Tobacco Use   Smoking status: Former    Current packs/day: 0.50    Average packs/day: 0.5 packs/day for 50.0 years (25.0 ttl pk-yrs)    Types: Cigarettes   Smokeless tobacco: Never   Tobacco comments:    trying to quit on her own  Vaping Use   Vaping status: Never Used  Substance and Sexual Activity   Alcohol use: Not Currently    Comment: Beer heavy everyday in past - last 10 years ago   Drug use: No   Sexual activity: Not on file  Other Topics Concern   Not on file  Social History Narrative   Patient lives with her partner (handicapped). Home care to help partner out.   Partner is bedridden.    Patient has people to help her out once home.   Social Determinants of Health   Financial Resource Strain: Low Risk  (10/31/2022)   Received from [facility]   Overall Financial Resource Strain (CARDIA)    Difficulty of Paying Living Expenses: Not hard at all  Food Insecurity: No Food Insecurity (10/31/2022)   Received from [facility]   Hunger Vital Sign    Worried About Running Out of Food in the Last Year: Never true    Ran Out of Food in the Last Year: Never true  Transportation Needs: No Transportation Needs (10/31/2022)   Received [facility] - [facility]    In the past 12 months, has lack of transportation kept you from medical appointments  or from getting medications?: No    Lack of Transportation (Non-Medical): No  Physical Activity: Not on file  Stress: Not on file  Social Connections: Not on file   Intimate Partner Violence: Not At Risk (10/16/2022)   Humiliation, Afraid, Rape, and Kick questionnaire    Fear of Current or Ex-Partner: No    Emotionally Abused: No    Physically Abused: No    Sexually Abused: No    Past Surgical History:  Procedure Laterality Date   APPENDECTOMY     BREAST EXCISIONAL BIOPSY Right    11/1999   BREAST LUMPECTOMY Right 2002   RIGHT lumpectomy w/ radiation 2002   BREAST SURGERY     CHOLECYSTECTOMY     ENDARTERECTOMY FEMORAL Bilateral 06/22/2020   Procedure: ENDARTERECTOMY FEMORAL;  Surgeon: Annice Needy, MD;  Location: [facility] ORS;  Service: Vascular;  Laterality: Bilateral;   INSERTION OF ILIAC STENT Bilateral 06/22/2020   Procedure: INSERTION OF ILIAC STENT;  Surgeon: Annice Needy, MD;  Location: [facility] ORS;  Service: Vascular;  Laterality: Bilateral;   LOWER EXTREMITY ANGIOGRAPHY Left 11/14/2017   Procedure: LOWER EXTREMITY ANGIOGRAPHY;  Surgeon: Annice Needy, MD;  Location: [facility] INVASIVE CV LAB;  Service: Cardiovascular;  Laterality: Left;   LOWER EXTREMITY ANGIOGRAPHY Right 04/21/2020   Procedure: LOWER EXTREMITY ANGIOGRAPHY;  Surgeon: Annice Needy, MD;  Location: [facility] INVAS[facility] CV LAB;  Service: Cardiovascular;  Laterality: Right;   LOWER EXTREMITY ANGIOGRAPHY Left 08/15/2020   Procedure: LOWER EXTREMITY ANGIOGRAPHY;  Surgeon: Annice Needy, MD;  Location: [facility];  Service: Cardiovascular;  Laterality: Left;   LOWER EXTREMITY ANGIOGRAPHY Right 08/22/2020   Procedure: LOWER EXTREMITY ANGIOGRAPHY;  Surgeon: Annice Needy, MD;  Location: [facility] INV[facility]IVE CV LAB;  Service: Cardiovascular;  Laterality: Right;   LOWER EXTREMITY ANGIOGRAPHY Left 10/17/2022   Procedure: Lower Extremity Angiography;  Surgeon: Annice Needy, MD;  Location: [facility] INVASIVE CV LAB;  Service: Cardiovascular;  Laterality: Left;   LOWER EXTREMITY ANGIOGRAPHY Right 10/22/2022   Procedure: Lower Extremity Angiography;  Surgeon: Annice Needy, MD;  Location: [facility] INVASIVE CV LAB;  Service:  Cardiovascular;  Laterality: Right;    Family History  Problem Relation Age of Onset   Breast cancer Mother 28   Breast cancer Maternal Aunt    Breast cancer Maternal Grandmother    Breast cancer Sister 54    Allergies  Allergen Reactions   Seasonal Ic [Cholestatin]     Seasonal allergies       Latest Ref Rng & Units 10/23/2022    4:45 AM 10/22/2022    6:14 AM 10/21/2022    3:45 AM  CBC  WBC 4.0 - 10.5 K/uL 11.2  9.8  9.8   Hemoglobin 12.0 - 15.0 g/dL 82.9  56.2  13.0   Hematocrit 36.0 - 46.0 % 39.2  37.7  36.5   Platelets 150 - 400 K/uL 309  300  286       CMP     Component Value Date/Time   NA 137 10/23/2022 0445   K 3.9 10/23/2022 0445   CL 103 10/23/2022 0445   CO2 26 10/23/2022 0445   GLUCOSE 104 (H) 10/23/2022 0445   BUN 18 10/23/2022 0445   CREATININE 0.66 10/23/2022 0445   CALCIUM 8.9 10/23/2022 0445   PROT 7.5 09/14/2021 1317   ALBUMIN 4.0 09/14/2021 1317   AST 44 (H) 09/14/2021 1317   ALT 43 09/14/2021 1317   ALKPHOS 80 09/14/2021 1317   BILITOT 0.6 09/14/2021  1317   GFRNONAA >60 10/23/2022 0445     VAS [place] ABI WITH/WO TBI  Result Date: 11/22/2022  LOWER EXTREMITY DOPPLER STUDY Patient Name:  JALIZA SEIFRIED  Date of Exam:   11/20/2022 Medical Rec #: [id number]       Accession #:    [id number] Date of Birth: [birth year]       Patient Gender: F Patient Age:   55 years Exam [facility] Procedure:      VAS [place] ABI WITH/WO TBI Referring Phys: Festus Barren --------------------------------------------------------------------------------  Indications: Peripheral artery disease.  Vascular Interventions: 08/15/2020 PTA of Lt SFA and popliteal artery. PTA of Lt                         distal SFA and tibioperoneal trunk. Stent Lt SFA and                         popliteal. 08/22/2020 PTA of Rt profunda artery, ATA, SFA                         and popliteal artery.                          10/17/2022: Aortogram and SElective Left Lower Extremity                          Angiogram. PTA of the Left Tibioperoneal trunk and                         Popliteal Artery with 4 mm diameter by 10 cm length                         Lutonix drug coated angioplasty balloon. Atherectomy of                         the Left SFA with the [place] Rex device. PTA of the                         Proximal to Mid LLeft SFA with 5 mm diameter by 6 cm                         length Lutonix drug coated angioplasty balloon.                          10/22/2022: Aortogram and Selective Right Lower                         Extremity Angiogram. PTA of the Right Anterior Tibial                         Artery with 3 mm diameter angioplasty balloon. PTA of                         the Right Ppoiliteal Artery with 5 mm diameter Lutonix                         drug coated angioplasty balloon. Comparison Study: 05/21/2022 Performing  Technologist: Debbe Bales RVS  Examination Guidelines: A complete evaluation includes at minimum, Doppler waveform signals and systolic blood pressure reading at the level of bilateral brachial, anterior tibial, and posterior tibial arteries, when vessel segments are accessible. Bilateral testing is considered an integral part of a complete examination. Photoelectric Plethysmograph (PPG) waveforms and toe systolic pressure readings are included as required and additional duplex testing as needed. Limited examinations for reoccurring indications may be performed as noted.  ABI Findings: +---------+------------------+-----+--------+--------+ Right    Rt Pressure (mmHg)IndexWaveformComment  +---------+------------------+-----+--------+--------+ Brachial 139                                     +---------+------------------+-----+--------+--------+ ATA      141               0.96 biphasic         +---------+------------------+-----+--------+--------+ PTA      155               1.05 biphasic         +---------+------------------+-----+--------+--------+ Great  Toe120               0.82 Normal           +---------+------------------+-----+--------+--------+ +---------+------------------+-----+--------+-------+ Left     Lt Pressure (mmHg)IndexWaveformComment +---------+------------------+-----+--------+-------+ Brachial 147                                    +---------+------------------+-----+--------+-------+ ATA      128               0.87 biphasic        +---------+------------------+-----+--------+-------+ PTA      133               0.90 biphasic        +---------+------------------+-----+--------+-------+ Ernst Spell               0.84 Normal          +---------+------------------+-----+--------+-------+ +-------+-----------+-----------+------------+------------+ ABI/TBIToday's ABIToday's TBIPrevious ABIPrevious TBI +-------+-----------+-----------+------------+------------+ Right  1.05       .82        .90         .73          +-------+-----------+-----------+------------+------------+ Left   .90        .84        .65         .65          +-------+-----------+-----------+------------+------------+ Bilateral ABIs appear increased compared to prior study on 05/21/2022. Bilateral TBIs appear increased compared to prior study on 05/21/2022.  Summary: Right: Resting right ankle-brachial index is within normal range. The right toe-brachial index is normal. Left: Resting left ankle-brachial index indicates mild left lower extremity arterial disease. The left toe-brachial index is normal. *See table(s) above for measurements and observations.  Electronically signed by Festus Barren MD on 11/22/2022 at 7:42:17 AM.    Final        Assessment & Plan:   1. Atherosclerosis of native artery of both lower extremities with intermittent claudication (HCC)  Recommend:  The patient has evidence of atherosclerosis of the lower extremities with claudication.  The patient does not voice lifestyle limiting changes at this point in  time.  Noninvasive studies do not suggest clinically significant change.  No invasive studies, angiography or surgery at this time The patient should  continue walking and begin a more formal exercise program.  The patient should continue antiplatelet therapy and aggressive treatment of the lipid abnormalities  No changes in the patient's medications at this time  Continued surveillance is indicated as atherosclerosis is likely to progress with time.    The patient will continue follow up with noninvasive studies as ordered.   2. Primary hypertension Continue antihypertensive medications as already ordered, these medications have been reviewed and there are no changes at this time.   Current Outpatient Medications on File Prior to Visit  Medication Sig Dispense Refill   acetaminophen (TYLENOL) 325 MG tablet Take 1-2 tablets (325-650 mg total) by mouth every 4 (four) hours as needed for mild pain (or temp >/= 101 F). 25 tablet 1   albuterol (VENTOLIN HFA) 108 (90 Base) MCG/ACT inhaler Inhale 2 puffs into the lungs every 6 (six) hours as needed for wheezing or shortness of breath. 8 g 1   ALPRAZolam (XANAX) 0.5 MG tablet Take 0.5 mg by mouth 2 (two) times daily as needed for anxiety.  0   amLODipine (NORVASC) 10 MG tablet Take 1 tablet (10 mg total) by mouth daily. Skip the dose if Systolic BP less than 130 mmHg 90 tablet 3   aspirin EC 81 MG tablet Take 1 tablet (81 mg total) by mouth daily. 90 tablet 3   clopidogrel (PLAVIX) 75 MG tablet Take 1 tablet (75 mg total) by mouth daily. 90 tablet 3   fluticasone furoate-vilanterol (BREO ELLIPTA) 200-25 MCG/ACT AEPB Inhale 1 puff into the lungs daily. 30 each 11   loratadine (CLARITIN) 10 MG tablet Take 10 mg by mouth daily as needed for allergies.     losartan (COZAAR) 50 MG tablet Take 50 mg by mouth daily.     pantoprazole (PROTONIX) 40 MG tablet Take 1 tablet (40 mg total) by mouth daily. 30 tablet 2   rosuvastatin (CRESTOR) 5 MG tablet  Take 5 mg by mouth daily.     sertraline (ZOLOFT) 100 MG tablet Take 100 mg by mouth daily.     No current facility-administered medications on file prior to visit.    There are no Patient Instructions on file for this visit. No follow-ups on file.   Georgiana Spinner, NP

## 2022-12-31 ENCOUNTER — Ambulatory Visit: Admit: 2022-12-31 | Payer: Medicare HMO | Admitting: Vascular Surgery

## 2022-12-31 SURGERY — LOWER EXTREMITY ANGIOGRAPHY
Anesthesia: Moderate Sedation | Laterality: Right

## 2023-01-08 DIAGNOSIS — I10 Essential (primary) hypertension: Secondary | ICD-10-CM | POA: Diagnosis not present

## 2023-01-08 DIAGNOSIS — F3340 Major depressive disorder, recurrent, in remission, unspecified: Secondary | ICD-10-CM | POA: Diagnosis not present

## 2023-01-08 DIAGNOSIS — I739 Peripheral vascular disease, unspecified: Secondary | ICD-10-CM | POA: Diagnosis not present

## 2023-02-06 DIAGNOSIS — J449 Chronic obstructive pulmonary disease, unspecified: Secondary | ICD-10-CM | POA: Diagnosis not present

## 2023-02-06 DIAGNOSIS — I10 Essential (primary) hypertension: Secondary | ICD-10-CM | POA: Diagnosis not present

## 2023-02-06 DIAGNOSIS — Z87891 Personal history of nicotine dependence: Secondary | ICD-10-CM | POA: Diagnosis not present

## 2023-02-06 DIAGNOSIS — I739 Peripheral vascular disease, unspecified: Secondary | ICD-10-CM | POA: Diagnosis not present

## 2023-02-06 DIAGNOSIS — Z0000 Encounter for general adult medical examination without abnormal findings: Secondary | ICD-10-CM | POA: Diagnosis not present

## 2023-02-06 DIAGNOSIS — F32A Depression, unspecified: Secondary | ICD-10-CM | POA: Diagnosis not present

## 2023-02-15 ENCOUNTER — Other Ambulatory Visit (INDEPENDENT_AMBULATORY_CARE_PROVIDER_SITE_OTHER): Payer: Self-pay | Admitting: Nurse Practitioner

## 2023-02-15 DIAGNOSIS — Z9889 Other specified postprocedural states: Secondary | ICD-10-CM

## 2023-02-20 ENCOUNTER — Encounter (INDEPENDENT_AMBULATORY_CARE_PROVIDER_SITE_OTHER): Payer: Self-pay | Admitting: Nurse Practitioner

## 2023-02-20 ENCOUNTER — Ambulatory Visit (INDEPENDENT_AMBULATORY_CARE_PROVIDER_SITE_OTHER): Payer: Medicare HMO

## 2023-02-20 ENCOUNTER — Ambulatory Visit (INDEPENDENT_AMBULATORY_CARE_PROVIDER_SITE_OTHER): Payer: Medicare HMO | Admitting: Nurse Practitioner

## 2023-02-20 VITALS — BP 146/71 | HR 69 | Resp 16 | Wt 192.8 lb

## 2023-02-20 DIAGNOSIS — I70213 Atherosclerosis of native arteries of extremities with intermittent claudication, bilateral legs: Secondary | ICD-10-CM | POA: Diagnosis not present

## 2023-02-20 DIAGNOSIS — I739 Peripheral vascular disease, unspecified: Secondary | ICD-10-CM | POA: Diagnosis not present

## 2023-02-20 DIAGNOSIS — Z9889 Other specified postprocedural states: Secondary | ICD-10-CM

## 2023-02-20 DIAGNOSIS — I10 Essential (primary) hypertension: Secondary | ICD-10-CM

## 2023-02-21 ENCOUNTER — Encounter (INDEPENDENT_AMBULATORY_CARE_PROVIDER_SITE_OTHER): Payer: Self-pay | Admitting: Nurse Practitioner

## 2023-02-21 NOTE — Progress Notes (Signed)
Subjective:    Patient ID: Joan [name], female    DOB: Jan 16, 1951, 73 y.o.   MRN: [id number] Chief Complaint  Patient presents with   Follow-up    3 month abi and bil arterial follow up    The patient returns to the office for followup and review status post angiogram with intervention on 10/17/2022 10/22/2022 due to lower extremity wounds.  Procedure: 10/17/2022 Procedure(s) Performed:             1.  Ultrasound guidance for vascular access right femoral artery             2.  Catheter placement into left common femoral artery from right femoral approach             3.  Aortogram and selective left lower extremity angiogram             4.  Percutaneous transluminal angioplasty of left tibioperoneal trunk and popliteal artery with 4 mm diameter by 10 cm length Lutonix drug-coated angioplasty balloon             5.  Atherectomy of left SFA with the [place] Rex device             6.  Percutaneous transluminal angioplasty of the proximal to mid left SFA with 5 mm diameter by 6 cm length Lutonix drug-coated angioplasty balloon             7.  StarClose closure device right femoral artery    10/22/2022 Procedure(s) Performed:             1.  Ultrasound guidance for vascular access left femoral artery             2.  Catheter placement into right common femoral artery from left femoral approach             3.  Aortogram and selective right lower extremity angiogram             4.  Percutaneous transluminal angioplasty of right anterior tibial artery with 3 mm diameter angioplasty balloon             5.  Percutaneous transluminal angioplasty of right popliteal artery with 5 mm diameter Lutonix drug-coated angioplasty balloon             6.  StarClose closure device left femoral artery   The patient notes improvement in the lower extremity symptoms. No interval shortening of the patient's claudication distance or rest pain symptoms. No new ulcers or wounds have occurred since the last  visit.  The previous wounds have healed.  There have been no significant changes to the patient's overall health care.  No documented history of amaurosis fugax or recent TIA symptoms. There are no recent neurological changes noted. No documented history of DVT, PE or superficial thrombophlebitis. The patient denies recent episodes of angina or shortness of breath.   ABI's Rt=1.10and Lt=1.03  (previous ABI's Rt=1.05 and Lt=0.90) Patient has widely patent stents bilaterally.  She has largely biphasic tibial vessel waveforms in the bilateral lower extremities.    Review of Systems  Skin:  Negative for wound.  All other systems reviewed and are negative.      Objective:   Physical Exam Vitals reviewed.  HENT:     Head: Normocephalic.  Cardiovascular:     Rate and Rhythm: Normal rate.     Pulses:          Dorsalis pedis pulses are detected w/  Doppler on the right side and detected w/ Doppler on the left side.       Posterior tibial pulses are detected w/ Doppler on the right side and detected w/ Doppler on the left side.  Pulmonary:     Effort: Pulmonary effort is normal.  Skin:    General: Skin is warm and dry.  Neurological:     Mental Status: She is alert and oriented to person, place, and time.  Psychiatric:        Mood and Affect: Mood normal.        Behavior: Behavior normal.        Thought Content: Thought content normal.     BP (!) 146/71   Pulse 69   Resp 16   Wt 192 lb 12.8 oz (87.5 kg)   BMI 33.09 kg/m   Past Medical History:  Diagnosis Date   Angina at rest [facility])    Anxiety    Arthritis    Breast cancer, right breast (HCC)    s/p lumpectomy and adjuvant XRT   Bronchitis    Cancer (HCC)    Claudication, intermittent (HCC)    Depression    Dyspnea    Environmental and seasonal allergies    GERD (gastroesophageal reflux disease)    with spicy food   Hepatitis C antibody test positive    Hypertension    Peripheral vascular disease (HCC)     Personal history of radiation therapy 2002   RIGHT lumpectomy   TIA (transient ischemic attack)    no residual. Many years ago    Social History   Socioeconomic History   Marital status: Single    Spouse name: Not on file   Number of children: Not on file   Years of education: Not on file   Highest education level: Not on file  Occupational History   Occupation: build harnesses for boats , large items  Tobacco Use   Smoking status: Former    Current packs/day: 0.50    Average packs/day: 0.5 packs/day for 50.0 years (25.0 ttl pk-yrs)    Types: Cigarettes   Smokeless tobacco: Never   Tobacco comments:    trying to quit on her own  Vaping Use   Vaping status: Never Used  Substance and Sexual Activity   Alcohol use: Not Currently    Comment: Beer heavy everyday in past - last 10 years ago   Drug use: No   Sexual activity: Not on file  Other Topics Concern   Not on file  Social History Narrative   Patient lives with her partner (handicapped). Home care to help partner out.   Partner is bedridden.    Patient has people to help her out once home.   Social Drivers of Corporate investment banker Strain: Low Risk  (10/31/2022)   Received from [facility]   Overall Financial Resource Strain (CARDIA)    Difficulty of Paying Living Expenses: Not hard at all  Food Insecurity: No Food Insecurity (10/31/2022)   Received from [facility]   Hunger Vital Sign    Worried About Running Out of Food in the Last Year: Never true    Ran Out of Food in the Last Year: Never true  Transportation Needs: No Transportation Needs (10/31/2022)   Received [facility] - Transportation    In the past 12 months, has lack of transportation kept you from medical appointments or from getting medications?: No  Lack of Transportation (Non-Medical): No  Physical Activity: Not on file  Stress: Not on file  Social Connections: Not on file   Intimate Partner Violence: Not At Risk (10/16/2022)   Humiliation, Afraid, Rape, and Kick questionnaire    Fear of Current or Ex-Partner: No    Emotionally Abused: No    Physically Abused: No    Sexually Abused: No    Past Surgical History:  Procedure Laterality Date   APPENDECTOMY     BREAST EXCISIONAL BIOPSY Right    11/1999   BREAST LUMPECTOMY Right 2002   RIGHT lumpectomy w/ radiation 2002   BREAST SURGERY     CHOLECYSTECTOMY     ENDARTERECTOMY FEMORAL Bilateral 06/22/2020   Procedure: ENDARTERECTOMY FEMORAL;  Surgeon: Annice Needy, MD;  Location: [facility] ORS;  Service: Vascular;  Laterality: Bilateral;   INSERTION OF ILIAC STENT Bilateral 06/22/2020   Procedure: INSERTION OF ILIAC STENT;  Surgeon: Annice Needy, MD;  Location: [facility] ORS;  Service: Vascular;  Laterality: Bilateral;   LOWER EXTREMITY ANGIOGRAPHY Left 11/14/2017   Procedure: LOWER EXTREMITY ANGIOGRAPHY;  Surgeon: Annice Needy, MD;  Location: [facility] INVASIVE CV LAB;  Service: Cardiovascular;  Laterality: Left;   LOWER EXTREMITY ANGIOGRAPHY Right 04/21/2020   Procedure: LOWER EXTREMITY ANGIOGRAPHY;  Surgeon: Annice Needy, MD;  Location: [facility] INVAS[facility] CV LAB;  Service: Cardiovascular;  Laterality: Right;   LOWER EXTREMITY ANGIOGRAPHY Left 08/15/2020   Procedure: LOWER EXTREMITY ANGIOGRAPHY;  Surgeon: Annice Needy, MD;  Location: [facility] INV[facility] CV LAB;  Service: Cardiovascular;  Laterality: Left;   LOWER EXTREMITY ANGIOGRAPHY Right 08/22/2020   Procedure: LOWER EXTREMITY ANGIOGRAPHY;  Surgeon: Annice Needy, MD;  Location: [facility] INVASIVE CV LAB;  Service: Cardiovascular;  Laterality: Right;   LOWER EXTREMITY ANGIOGRAPHY Left 10/17/2022   Procedure: Lower Extremity Angiography;  Surgeon: Annice Needy, MD;  Location: [facility] INVASIVE CV LAB;  Service: Cardiovascular;  Laterality: Left;   LOWER EXTREMITY ANGIOGRAPHY Right 10/22/2022   Procedure: Lower Extremity Angiography;  Surgeon: Annice Needy, MD;  Location: [facility] INVASIVE CV LAB;  Service:  Cardiovascular;  Laterality: Right;    Family History  Problem Relation Age of Onset   Breast cancer Mother 52   Breast cancer Maternal Aunt    Breast cancer Maternal Grandmother    Breast cancer Sister 35    Allergies  Allergen Reactions   Seasonal Ic [Cholestatin]     Seasonal allergies       Latest Ref Rng & Units 10/23/2022    4:45 AM 10/22/2022    6:14 AM 10/21/2022    3:45 AM  CBC  WBC 4.0 - 10.5 K/uL 11.2  9.8  9.8   Hemoglobin 12.0 - 15.0 g/dL 52.8  41.3  24.4   Hematocrit 36.0 - 46.0 % 39.2  37.7  36.5   Platelets 150 - 400 K/uL 309  300  286       CMP     Component Value Date/Time   NA 137 10/23/2022 0445   K 3.9 10/23/2022 0445   CL 103 10/23/2022 0445   CO2 26 10/23/2022 0445   GLUCOSE 104 (H) 10/23/2022 0445   BUN 18 10/23/2022 0445   CREATININE 0.66 10/23/2022 0445   CALCIUM 8.9 10/23/2022 0445   PROT 7.5 09/14/2021 1317   ALBUMIN 4.0 09/14/2021 1317   AST 44 (H) 09/14/2021 1317   ALT 43 09/14/2021 1317   ALKPHOS 80 09/14/2021 1317   BILITOT 0.6 09/14/2021 1317   GFRNONAA >60 10/23/2022 0445  VAS [place] ABI WITH/WO TBI  Result Date: 11/22/2022  LOWER EXTREMITY DOPPLER STUDY Patient Name:  Joan [name]  Date of Exam:   11/20/2022 Medical Rec #: [id number]       Accession #:    [id number] Date of Birth: Sep 10, 1950       Patient Gender: F Patient Age:   57 years Exam Location:  [facility] Procedure:      VAS [place] ABI WITH/WO TBI Referring Phys: Festus Barren --------------------------------------------------------------------------------  Indications: Peripheral artery disease.  Vascular Interventions: 08/15/2020 PTA of Lt SFA and popliteal artery. PTA of Lt                         distal SFA and tibioperoneal trunk. Stent Lt SFA and                         popliteal. 08/22/2020 PTA of Rt profunda artery, ATA, SFA                         and popliteal artery.                          10/17/2022: Aortogram and SElective Left Lower Extremity                          Angiogram. PTA of the Left Tibioperoneal trunk and                         Popliteal Artery with 4 mm diameter by 10 cm length                         Lutonix drug coated angioplasty balloon. Atherectomy of                         the Left SFA with the [place] Rex device. PTA of the                         Proximal to Mid LLeft SFA with 5 mm diameter by 6 cm                         length Lutonix drug coated angioplasty balloon.                          10/22/2022: Aortogram and Selective Right Lower                         Extremity Angiogram. PTA of the Right Anterior Tibial                         Artery with 3 mm diameter angioplasty balloon. PTA of                         the Right Ppoiliteal Artery with 5 mm diameter Lutonix                         drug coated angioplasty balloon. Comparison Study: 05/21/2022 Performing Technologist: Debbe Bales RVS  Examination Guidelines: A complete evaluation includes  at minimum, Doppler waveform signals and systolic blood pressure reading at the level of bilateral brachial, anterior tibial, and posterior tibial arteries, when vessel segments are accessible. Bilateral testing is considered an integral part of a complete examination. Photoelectric Plethysmograph (PPG) waveforms and toe systolic pressure readings are included as required and additional duplex testing as needed. Limited examinations for reoccurring indications may be performed as noted.  ABI Findings: +---------+------------------+-----+--------+--------+ Right    Rt Pressure (mmHg)IndexWaveformComment  +---------+------------------+-----+--------+--------+ Brachial 139                                     +---------+------------------+-----+--------+--------+ ATA      141               0.96 biphasic         +---------+------------------+-----+--------+--------+ PTA      155               1.05 biphasic         +---------+------------------+-----+--------+--------+ Great  Toe120               0.82 Normal           +---------+------------------+-----+--------+--------+ +---------+------------------+-----+--------+-------+ Left     Lt Pressure (mmHg)IndexWaveformComment +---------+------------------+-----+--------+-------+ Brachial 147                                    +---------+------------------+-----+--------+-------+ ATA      128               0.87 biphasic        +---------+------------------+-----+--------+-------+ PTA      133               0.90 biphasic        +---------+------------------+-----+--------+-------+ Ernst Spell               0.84 Normal          +---------+------------------+-----+--------+-------+ +-------+-----------+-----------+------------+------------+ ABI/TBIToday's ABIToday's TBIPrevious ABIPrevious TBI +-------+-----------+-----------+------------+------------+ Right  1.05       .82        .90         .73          +-------+-----------+-----------+------------+------------+ Left   .90        .84        .65         .65          +-------+-----------+-----------+------------+------------+ Bilateral ABIs appear increased compared to prior study on 05/21/2022. Bilateral TBIs appear increased compared to prior study on 05/21/2022.  Summary: Right: Resting right ankle-brachial index is within normal range. The right toe-brachial index is normal. Left: Resting left ankle-brachial index indicates mild left lower extremity arterial disease. The left toe-brachial index is normal. *See table(s) above for measurements and observations.  Electronically signed by Festus Barren MD on 11/22/2022 at 7:42:17 AM.    Final        Assessment & Plan:   1. Atherosclerosis of native artery of both lower extremities with intermittent claudication (HCC)  Recommend:  The patient has evidence of atherosclerosis of the lower extremities with claudication.  The patient does not voice lifestyle limiting changes at this point in  time.  Noninvasive studies do not suggest clinically significant change.  No invasive studies, angiography or surgery at this time The patient should continue walking and begin a more formal exercise program.  The  patient should continue antiplatelet therapy and aggressive treatment of the lipid abnormalities  No changes in the patient's medications at this time  Continued surveillance is indicated as atherosclerosis is likely to progress with time.    The patient will continue follow up with noninvasive studies as ordered.   2. Primary hypertension Continue antihypertensive medications as already ordered, these medications have been reviewed and there are no changes at this time.   Current Outpatient Medications on File Prior to Visit  Medication Sig Dispense Refill   acetaminophen (TYLENOL) 325 MG tablet Take 1-2 tablets (325-650 mg total) by mouth every 4 (four) hours as needed for mild pain (or temp >/= 101 F). 25 tablet 1   albuterol (VENTOLIN HFA) 108 (90 Base) MCG/ACT inhaler Inhale 2 puffs into the lungs every 6 (six) hours as needed for wheezing or shortness of breath. 8 g 1   ALPRAZolam (XANAX) 0.5 MG tablet Take 0.5 mg by mouth 2 (two) times daily as needed for anxiety.  0   amLODipine (NORVASC) 10 MG tablet Take 1 tablet (10 mg total) by mouth daily. Skip the dose if Systolic BP less than 130 mmHg 90 tablet 3   aspirin EC 81 MG tablet Take 1 tablet (81 mg total) by mouth daily. 90 tablet 3   clopidogrel (PLAVIX) 75 MG tablet Take 1 tablet (75 mg total) by mouth daily. 90 tablet 3   fluticasone furoate-vilanterol (BREO ELLIPTA) 200-25 MCG/ACT AEPB Inhale 1 puff into the lungs daily. 30 each 11   loratadine (CLARITIN) 10 MG tablet Take 10 mg by mouth daily as needed for allergies.     losartan (COZAAR) 50 MG tablet Take 50 mg by mouth daily.     rosuvastatin (CRESTOR) 5 MG tablet Take 5 mg by mouth daily.     sertraline (ZOLOFT) 100 MG tablet Take 100 mg by mouth daily.      pantoprazole (PROTONIX) 40 MG tablet Take 1 tablet (40 mg total) by mouth daily. 30 tablet 2   No current facility-administered medications on file prior to visit.    There are no Patient Instructions on file for this visit. No follow-ups on file.   Georgiana Spinner, NP

## 2023-02-22 LAB — VAS US ABI WITH/WO TBI
Left ABI: 1.03
Right ABI: 1.1

## 2023-03-26 ENCOUNTER — Emergency Department

## 2023-03-26 ENCOUNTER — Encounter: Payer: Self-pay | Admitting: *Deleted

## 2023-03-26 ENCOUNTER — Inpatient Hospital Stay
Admission: EM | Admit: 2023-03-26 | Discharge: 2023-03-31 | DRG: 865 | Disposition: A | Discharge status: Home / Self Care | Attending: Internal Medicine | Admitting: Internal Medicine

## 2023-03-26 ENCOUNTER — Other Ambulatory Visit: Payer: Self-pay

## 2023-03-26 DIAGNOSIS — E785 Hyperlipidemia, unspecified: Secondary | ICD-10-CM | POA: Diagnosis present

## 2023-03-26 DIAGNOSIS — Z6831 Body mass index (BMI) 31.0-31.9, adult: Secondary | ICD-10-CM

## 2023-03-26 DIAGNOSIS — R7989 Other specified abnormal findings of blood chemistry: Secondary | ICD-10-CM

## 2023-03-26 DIAGNOSIS — J440 Chronic obstructive pulmonary disease with acute lower respiratory infection: Secondary | ICD-10-CM | POA: Diagnosis present

## 2023-03-26 DIAGNOSIS — Z803 Family history of malignant neoplasm of breast: Secondary | ICD-10-CM | POA: Diagnosis not present

## 2023-03-26 DIAGNOSIS — I739 Peripheral vascular disease, unspecified: Secondary | ICD-10-CM | POA: Diagnosis present

## 2023-03-26 DIAGNOSIS — Z79899 Other long term (current) drug therapy: Secondary | ICD-10-CM

## 2023-03-26 DIAGNOSIS — K219 Gastro-esophageal reflux disease without esophagitis: Secondary | ICD-10-CM | POA: Diagnosis present

## 2023-03-26 DIAGNOSIS — J9601 Acute respiratory failure with hypoxia: Secondary | ICD-10-CM | POA: Diagnosis not present

## 2023-03-26 DIAGNOSIS — F419 Anxiety disorder, unspecified: Secondary | ICD-10-CM | POA: Diagnosis present

## 2023-03-26 DIAGNOSIS — E871 Hypo-osmolality and hyponatremia: Secondary | ICD-10-CM | POA: Diagnosis present

## 2023-03-26 DIAGNOSIS — Z923 Personal history of irradiation: Secondary | ICD-10-CM | POA: Diagnosis not present

## 2023-03-26 DIAGNOSIS — Z7982 Long term (current) use of aspirin: Secondary | ICD-10-CM | POA: Diagnosis not present

## 2023-03-26 DIAGNOSIS — I5A Non-ischemic myocardial injury (non-traumatic): Secondary | ICD-10-CM | POA: Diagnosis present

## 2023-03-26 DIAGNOSIS — R918 Other nonspecific abnormal finding of lung field: Secondary | ICD-10-CM | POA: Diagnosis not present

## 2023-03-26 DIAGNOSIS — J9621 Acute and chronic respiratory failure with hypoxia: Secondary | ICD-10-CM | POA: Diagnosis present

## 2023-03-26 DIAGNOSIS — Z888 Allergy status to other drugs, medicaments and biological substances status: Secondary | ICD-10-CM | POA: Diagnosis not present

## 2023-03-26 DIAGNOSIS — I10 Essential (primary) hypertension: Secondary | ICD-10-CM | POA: Diagnosis present

## 2023-03-26 DIAGNOSIS — J1089 Influenza due to other identified influenza virus with other manifestations: Principal | ICD-10-CM | POA: Diagnosis present

## 2023-03-26 DIAGNOSIS — F32A Depression, unspecified: Secondary | ICD-10-CM | POA: Diagnosis present

## 2023-03-26 DIAGNOSIS — E66811 Obesity, class 1: Secondary | ICD-10-CM | POA: Diagnosis present

## 2023-03-26 DIAGNOSIS — J441 Chronic obstructive pulmonary disease with (acute) exacerbation: Secondary | ICD-10-CM | POA: Diagnosis present

## 2023-03-26 DIAGNOSIS — Z853 Personal history of malignant neoplasm of breast: Secondary | ICD-10-CM

## 2023-03-26 DIAGNOSIS — E6609 Other obesity due to excess calories: Secondary | ICD-10-CM | POA: Diagnosis not present

## 2023-03-26 DIAGNOSIS — R059 Cough, unspecified: Secondary | ICD-10-CM | POA: Diagnosis not present

## 2023-03-26 DIAGNOSIS — J111 Influenza due to unidentified influenza virus with other respiratory manifestations: Secondary | ICD-10-CM | POA: Diagnosis not present

## 2023-03-26 DIAGNOSIS — Z66 Do not resuscitate: Secondary | ICD-10-CM | POA: Diagnosis present

## 2023-03-26 DIAGNOSIS — Z7902 Long term (current) use of antithrombotics/antiplatelets: Secondary | ICD-10-CM

## 2023-03-26 DIAGNOSIS — Z720 Tobacco use: Secondary | ICD-10-CM | POA: Diagnosis present

## 2023-03-26 DIAGNOSIS — R0602 Shortness of breath: Secondary | ICD-10-CM | POA: Diagnosis not present

## 2023-03-26 DIAGNOSIS — Z8673 Personal history of transient ischemic attack (TIA), and cerebral infarction without residual deficits: Secondary | ICD-10-CM | POA: Diagnosis not present

## 2023-03-26 LAB — BASIC METABOLIC PANEL
Anion gap: 11 (ref 5–15)
BUN: 16 mg/dL (ref 8–23)
CO2: 26 mmol/L (ref 22–32)
Calcium: 8.8 mg/dL — ABNORMAL LOW (ref 8.9–10.3)
Chloride: 96 mmol/L — ABNORMAL LOW (ref 98–111)
Creatinine, Ser: 1.08 mg/dL — ABNORMAL HIGH (ref 0.44–1.00)
GFR, Estimated: 55 mL/min — ABNORMAL LOW (ref >60)
Glucose, Bld: 103 mg/dL — ABNORMAL HIGH (ref 70–99)
Potassium: 3.9 mmol/L (ref 3.5–5.1)
Sodium: 133 mmol/L — ABNORMAL LOW (ref 135–145)

## 2023-03-26 LAB — CBC
HCT: 35.4 % — ABNORMAL LOW (ref 36.0–46.0)
Hemoglobin: 11.6 g/dL — ABNORMAL LOW (ref 12.0–15.0)
MCH: 29 pg (ref 26.0–34.0)
MCHC: 32.8 g/dL (ref 30.0–36.0)
MCV: 88.5 fL (ref 80.0–100.0)
Platelets: 305 10*3/uL (ref 150–400)
RBC: 4 MIL/uL (ref 3.87–5.11)
RDW: 13.7 % (ref 11.5–15.5)
WBC: 8.1 10*3/uL (ref 4.0–10.5)
nRBC: 0 % (ref 0.0–0.2)

## 2023-03-26 LAB — TROPONIN I (HIGH SENSITIVITY)
Troponin I (High Sensitivity): 229 ng/L (ref <18)
Troponin I (High Sensitivity): 257 ng/L
Troponin I (High Sensitivity): 239 ng/L (ref <18)

## 2023-03-26 LAB — RESP PANEL BY RT-PCR (RSV, FLU A&B, COVID)  RVPGX2
Influenza A by PCR: POSITIVE — AB
Influenza B by PCR: NEGATIVE
Resp Syncytial Virus by PCR: NEGATIVE
SARS Coronavirus 2 by RT PCR: NEGATIVE

## 2023-03-26 LAB — BRAIN NATRIURETIC PEPTIDE: B Natriuretic Peptide: 172.8 pg/mL — ABNORMAL HIGH (ref 0.0–100.0)

## 2023-03-26 LAB — PROTIME-INR
INR: 1.1 (ref 0.8–1.2)
Prothrombin Time: 14.5 s (ref 11.4–15.2)

## 2023-03-26 MED ORDER — SODIUM CHLORIDE 0.9 % IV SOLN
500.0000 mg | INTRAVENOUS | Status: AC
  Administered 2023-03-27 – 2023-03-29 (×3): 500 mg via INTRAVENOUS
  Filled 2023-03-26 (×3): qty 5

## 2023-03-26 MED ORDER — FLUTICASONE FUROATE-VILANTEROL 200-25 MCG/ACT IN AEPB
1.0000 | INHALATION_SPRAY | Freq: Every day | RESPIRATORY_TRACT | Status: DC
  Filled 2023-03-26 (×2): qty 28

## 2023-03-26 MED ORDER — PANTOPRAZOLE SODIUM 40 MG PO TBEC
40.0000 mg | DELAYED_RELEASE_TABLET | Freq: Every day | ORAL | Status: DC
  Administered 2023-03-26 – 2023-03-31 (×6): 40 mg via ORAL
  Filled 2023-03-26 (×6): qty 1

## 2023-03-26 MED ORDER — SODIUM CHLORIDE 0.9% FLUSH
3.0000 mL | Freq: Two times a day (BID) | INTRAVENOUS | Status: DC
Start: 2023-03-26 — End: 2023-03-31
  Administered 2023-03-26 – 2023-03-31 (×10): 3 mL via INTRAVENOUS

## 2023-03-26 MED ORDER — ACETAMINOPHEN 650 MG RE SUPP: 650.0000 mg | Freq: Four times a day (QID) | RECTAL | Status: DC | PRN

## 2023-03-26 MED ORDER — POLYETHYLENE GLYCOL 3350 17 G PO PACK: 17.0000 g | PACK | Freq: Every day | ORAL | Status: DC | PRN

## 2023-03-26 MED ORDER — IPRATROPIUM-ALBUTEROL 0.5-2.5 (3) MG/3ML IN SOLN
3.0000 mL | RESPIRATORY_TRACT | Status: DC
  Administered 2023-03-26 – 2023-03-27 (×4): 3 mL via RESPIRATORY_TRACT
  Filled 2023-03-26 (×4): qty 3

## 2023-03-26 MED ORDER — IPRATROPIUM-ALBUTEROL 0.5-2.5 (3) MG/3ML IN SOLN: 6.0000 mL | RESPIRATORY_TRACT | Status: DC

## 2023-03-26 MED ORDER — SODIUM CHLORIDE 0.9 % IV SOLN
500.0000 mg | Freq: Once | INTRAVENOUS | Status: AC
  Administered 2023-03-26: 500 mg via INTRAVENOUS
  Filled 2023-03-26: qty 5

## 2023-03-26 MED ORDER — ASPIRIN 81 MG PO CHEW
324.0000 mg | CHEWABLE_TABLET | Freq: Once | ORAL | Status: AC
  Administered 2023-03-26: 324 mg via ORAL
  Filled 2023-03-26: qty 4

## 2023-03-26 MED ORDER — ACETAMINOPHEN 325 MG PO TABS: 650.0000 mg | ORAL_TABLET | Freq: Four times a day (QID) | ORAL | Status: DC | PRN

## 2023-03-26 MED ORDER — ENOXAPARIN SODIUM 40 MG/0.4ML IJ SOSY
40.0000 mg | PREFILLED_SYRINGE | INTRAMUSCULAR | Status: DC
  Administered 2023-03-27 – 2023-03-31 (×5): 40 mg via SUBCUTANEOUS
  Filled 2023-03-26 (×5): qty 0.4

## 2023-03-26 MED ORDER — OSELTAMIVIR PHOSPHATE 75 MG PO CAPS
75.0000 mg | ORAL_CAPSULE | Freq: Once | ORAL | Status: AC
  Administered 2023-03-26: 75 mg via ORAL
  Filled 2023-03-26: qty 1

## 2023-03-26 MED ORDER — METHYLPREDNISOLONE SODIUM SUCC 125 MG IJ SOLR
125.0000 mg | Freq: Once | INTRAMUSCULAR | Status: AC
  Administered 2023-03-26: 125 mg via INTRAVENOUS
  Filled 2023-03-26: qty 2

## 2023-03-26 MED ORDER — OSELTAMIVIR PHOSPHATE 30 MG PO CAPS
30.0000 mg | ORAL_CAPSULE | Freq: Two times a day (BID) | ORAL | Status: AC
  Administered 2023-03-27 – 2023-03-31 (×9): 30 mg via ORAL
  Filled 2023-03-26 (×10): qty 1

## 2023-03-26 MED ORDER — ATORVASTATIN CALCIUM 20 MG PO TABS
40.0000 mg | ORAL_TABLET | Freq: Every day | ORAL | Status: AC
  Administered 2023-03-26: 40 mg via ORAL
  Filled 2023-03-26: qty 2

## 2023-03-26 MED ORDER — SODIUM CHLORIDE 0.9 % IV SOLN
1.0000 g | INTRAVENOUS | Status: DC
  Administered 2023-03-26 – 2023-03-28 (×3): 1 g via INTRAVENOUS
  Filled 2023-03-26 (×4): qty 10

## 2023-03-26 MED ORDER — IPRATROPIUM-ALBUTEROL 0.5-2.5 (3) MG/3ML IN SOLN
6.0000 mL | Freq: Once | RESPIRATORY_TRACT | Status: AC
  Administered 2023-03-26: 6 mL via RESPIRATORY_TRACT
  Filled 2023-03-26: qty 9

## 2023-03-26 MED ORDER — METHYLPREDNISOLONE SODIUM SUCC 125 MG IJ SOLR
60.0000 mg | Freq: Every day | INTRAMUSCULAR | Status: DC
  Administered 2023-03-27 – 2023-03-29 (×3): 60 mg via INTRAVENOUS
  Filled 2023-03-26 (×3): qty 2

## 2023-03-26 NOTE — ED Triage Notes (Signed)
 First nurse note: Brought over from [facility]. Patient was going to check in a [place] and bystanders grabbed staff due to patient being SOB outside of [facility]. KC checked vitals and brought patient over to ED.   KC vitals: 80s% RA - placed on 2L and sats are 90%

## 2023-03-26 NOTE — Assessment & Plan Note (Addendum)
 I will conitnue to trend this. 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 Check echo in AM. No significant ST_T wave changes.  Empiric aspirin, statin X 1 dose.. No betablocker due to resp issues.

## 2023-03-26 NOTE — Progress Notes (Signed)
 Pt arrived to room 158 via w/c from ED with COPD exacerbation and positive Flu A.  Pt ambulated to BR to void without oxygen and sats noted to be mid 70's when she returned to bed.  Sats quickly improved up to 94% on 2 liters n/c.  Pt denies any pain and is alert and oriented x 4.    03/26/23 2253  Vitals  Temp 98.8 F (37.1 C)  Temp Source Oral  BP (!) 133/57  MAP (mmHg) 78  BP Location Right Arm  BP Method Automatic  Patient Position (if appropriate) Lying  Pulse Rate 87  Pulse Rate Source Dinamap  Resp (!) 24  Level of Consciousness  Level of Consciousness Alert  MEWS COLOR  MEWS Score Color Green  Oxygen Therapy  SpO2 92 %  O2 Device Nasal Cannula  O2 Flow Rate (L/min) 2 L/min  Pain Assessment  Pain Scale 0-10  Pain Score 0  POSS Scale (Pasero Opioid Sedation Scale)  POSS *See Group Information* 1-Acceptable,Awake and alert  Height and Weight  Height 5\' 4"  (1.626 m)  Weight 84.3 kg  Type of Scale Used Bed  Type of Weight Actual  BSA (Calculated - sq m) 1.95 sq meters  BMI (Calculated) 31.89  Weight in (lb) to have BMI = 25 145.3  MEWS Score  MEWS Temp 0  MEWS Systolic 0  MEWS Pulse 0  MEWS RR 1  MEWS LOC 0  MEWS Score 1   Hilton Sinclair BSN RN [facility] 03/26/2023, 11:14 PM

## 2023-03-26 NOTE — H&P (Addendum)
 History and Physical    Patient: Joan [name] ZOX:[id number] DOB: [birth year] DOA: 03/26/2023 DOS: the patient was seen and examined on 03/26/2023 PCP: Barbette Reichmann, MD  Patient coming from: [facility]  Chief Complaint:  Chief Complaint  Patient presents with   Shortness of Breath   HPI: Joan [name] is a 73 y.o. female with medical history significant of COPD. However, ptient does not use suplemental oxygen at home. Patinet was in her usual state of ehalht till about 2 days ago.   Since then ptient reports a cough, mostly dry sensation of sore throat, sob and chest tightness. No leg sweling . No fever.  Sob and chest tightness are exertion related, althoguth present even at rest.   Patinet initially presented to outpatient clinic with above complaints. Due to her Dyspnea, she was brought to the ER.  ER patinet was 80 % SPO2 on room air. Stabilized on 2lpm suplemental oxygen. S/p duoneb . Iv steroid and aztihromcyin. Paitnet is curenlty asytmpatomic . Medical eval is sought.  Unfortunatley, patinet has had similar episodes in the past. Patient is not taking breo as it is too expensive for patient. Review of Systems: As mentioned in the history of present illness. All other systems reviewed and are negative. Past Medical History:  Diagnosis Date   Angina at rest [facility])    Anxiety    Arthritis    Breast cancer, right breast (HCC)    s/p lumpectomy and adjuvant XRT   Bronchitis    Cancer (HCC)    Claudication, intermittent (HCC)    Depression    Dyspnea    Environmental and seasonal allergies    GERD (gastroesophageal reflux disease)    with spicy food   Hepatitis C antibody test positive    Hypertension    Peripheral vascular disease (HCC)    Personal history of radiation therapy 2002   RIGHT lumpectomy   TIA (transient ischemic attack)    no residual. Many years ago   Past Surgical History:  Procedure Laterality Date   APPENDECTOMY     BREAST  EXCISIONAL BIOPSY Right    11/1999   BREAST LUMPECTOMY Right 2002   RIGHT lumpectomy w/ radiation 2002   BREAST SURGERY     CHOLECYSTECTOMY     ENDARTERECTOMY FEMORAL Bilateral 06/22/2020   Procedure: ENDARTERECTOMY FEMORAL;  Surgeon: Annice Needy, MD;  Location: [facility] ORS;  Service: Vascular;  Laterality: Bilateral;   INSERTION OF ILIAC STENT Bilateral 06/22/2020   Procedure: INSERTION OF ILIAC STENT;  Surgeon: Annice Needy, MD;  Location: [facility] ORS;  Service: Vascular;  Laterality: Bilateral;   LOWER EXTREMITY ANGIOGRAPHY Left 11/14/2017   Procedure: LOWER EXTREMITY ANGIOGRAPHY;  Surgeon: Annice Needy, MD;  Location: [facility] INVASIVE CV LAB;  Service: Cardiovascular;  Laterality: Left;   LOWER EXTREMITY ANGIOGRAPHY Right 04/21/2020   Procedure: LOWER EXTREMITY ANGIOGRAPHY;  Surgeon: Annice Needy, MD;  Location: [facility] INV[facility]IVE CV LAB;  Service: Cardiovascular;  Laterality: Right;   LOWER EXTREMITY ANGIOGRAPHY Left 08/15/2020   Procedure: LOWER EXTREMITY ANGIOGRAPHY;  Surgeon: Annice Needy, MD;  Location: [facility] IN[facility] CV [facility];  Service: Cardiovascular;  Laterality: Left;   LOWER EXTREMITY ANGIOGRAPHY Right 08/22/2020   Procedure: LOWER EXTREMITY ANGIOGRAPHY;  Surgeon: Annice Needy, MD;  Location: [facility] IN[facility] CV [facility];  Service: Cardiovascular;  Laterality: Right;   LOWER EXTREMITY ANGIOGRAPHY Left 10/17/2022   Procedure: Lower Extremity Angiography;  Surgeon: Annice Needy, MD;  Location: [facility] IN[facility] CV [facility];  Service:  Cardiovascular;  Laterality: Left;   LOWER EXTREMITY ANGIOGRAPHY Right 10/22/2022   Procedure: Lower Extremity Angiography;  Surgeon: Annice Needy, MD;  Location: [facility] IN[facility] CV [facility];  Service: Cardiovascular;  Laterality: Right;   Social History:  reports that she has quit smoking. Her smoking use included cigarettes. She has a 25 pack-year smoking history. She has never used smokeless tobacco. She reports that she does not currently use alcohol. She reports that she does not use  drugs.  Allergies  Allergen Reactions   Seasonal Ic [Cholestatin]     Seasonal allergies    Family History  Problem Relation Age of Onset   Breast cancer Mother 71   Breast cancer Maternal Aunt    Breast cancer Maternal Grandmother    Breast cancer Sister 38    Prior to Admission medications   Medication Sig Start Date End Date Taking? Authorizing Provider  acetaminophen (TYLENOL) 325 MG tablet Take 1-2 tablets (325-650 mg total) by mouth every 4 (four) hours as needed for mild pain (or temp >/= 101 F). 06/25/20   Louisa Second, MD  albuterol (VENTOLIN HFA) 108 (90 Base) MCG/ACT inhaler Inhale 2 puffs into the lungs every 6 (six) hours as needed for wheezing or shortness of breath. 09/19/21   Georgeann Oppenheim, Jonelle Sports, MD  ALPRAZolam Prudy Feeler) 0.5 MG tablet Take 0.5 mg by mouth 2 (two) times daily as needed for anxiety. 09/18/17   [provider]  amLODipine (NORVASC) 10 MG tablet Take 1 tablet (10 mg total) by mouth daily. Skip the dose if Systolic BP less than 130 mmHg 10/23/22 10/23/23  Gillis Santa, MD  aspirin EC 81 MG tablet Take 1 tablet (81 mg total) by mouth daily. 10/23/22 10/23/23  Gillis Santa, MD  clopidogrel (PLAVIX) 75 MG tablet Take 1 tablet (75 mg total) by mouth daily. 10/23/22 10/23/23  Gillis Santa, MD  fluticasone furoate-vilanterol (BREO ELLIPTA) 200-25 MCG/ACT AEPB Inhale 1 puff into the lungs daily. 10/24/22 10/24/23  Gillis Santa, MD  loratadine (CLARITIN) 10 MG tablet Take 10 mg by mouth daily as needed for allergies.    [provider]  losartan (COZAAR) 50 MG tablet Take 50 mg by mouth daily. 02/11/21   [provider]  pantoprazole (PROTONIX) 40 MG tablet Take 1 tablet (40 mg total) by mouth daily. 10/23/22 01/21/23  Gillis Santa, MD  rosuvastatin (CRESTOR) 5 MG tablet Take 5 mg by mouth daily. 05/15/22   [provider]  sertraline (ZOLOFT) 100 MG tablet Take 100 mg by mouth daily. 10/16/17   [provider]    Physical  Exam: Vitals:   03/26/23 1819  BP: (!) 147/61  Pulse: 82  Resp: (!) 22  Temp: 99.4 F (37.4 C)  TempSrc: Oral  SpO2: 90%  Weight: 86.2 kg  Height: 5\' 4"  (1.626 m)   General - patient on 2lpm oxygen. Laying flat in bed, want to sleep. No distress. Fully coherent. Pain free Resp - diffuse diminished breath sounds with expiratory wheezing, although some of it is also upper airway related. Cvs-s1s2 normal Abdomen - osft non tender Extremity - warm no edema. Oropharyngeal exam wnl. Data Reviewed:  Labs on Admission:  Results for orders placed or performed during the hospital encounter of 03/26/23 (from the past 24 hours)  Basic metabolic panel     Status: Abnormal   Collection Time: 03/26/23  6:25 PM  Result Value Ref Range   Sodium 133 (L) 135 - 145 mmol/L   Potassium 3.9 3.5 - 5.1 mmol/L  Chloride 96 (L) 98 - 111 mmol/L   CO2 26 22 - 32 mmol/L   Glucose, Bld 103 (H) 70 - 99 mg/dL   BUN 16 8 - 23 mg/dL   Creatinine, Ser 1.61 (H) 0.44 - 1.00 mg/dL   Calcium 8.8 (L) 8.9 - 10.3 mg/dL   GFR, Estimated 55 (L) >60 mL/min   Anion gap 11 5 - 15  CBC     Status: Abnormal   Collection Time: 03/26/23  6:25 PM  Result Value Ref Range   WBC 8.1 4.0 - 10.5 K/uL   RBC 4.00 3.87 - 5.11 MIL/uL   Hemoglobin 11.6 (L) 12.0 - 15.0 g/dL   HCT 09.6 (L) 04.5 - 40.9 %   MCV 88.5 80.0 - 100.0 fL   MCH 29.0 26.0 - 34.0 pg   MCHC 32.8 30.0 - 36.0 g/dL   RDW 81.1 91.4 - 78.2 %   Platelets 305 150 - 400 K/uL   nRBC 0.0 0.0 - 0.2 %  Troponin I (High Sensitivity)     Status: Abnormal   Collection Time: 03/26/23  6:25 PM  Result Value Ref Range   Troponin I (High Sensitivity) 229 (HH) <18 ng/L  Protime-INR (order if Patient is taking Coumadin / Warfarin)     Status: None   Collection Time: 03/26/23  6:25 PM  Result Value Ref Range   Prothrombin Time 14.5 11.4 - 15.2 seconds   INR 1.1 0.8 - 1.2  Brain natriuretic peptide     Status: Abnormal   Collection Time: 03/26/23  6:25 PM  Result Value  Ref Range   B Natriuretic Peptide 172.8 (H) 0.0 - 100.0 pg/mL  Resp panel by RT-PCR (RSV, Flu A&B, Covid) Anterior Nasal Swab     Status: Abnormal   Collection Time: 03/26/23  7:21 PM   Specimen: Anterior Nasal Swab  Result Value Ref Range   SARS Coronavirus 2 by RT PCR NEGATIVE NEGATIVE   Influenza A by PCR POSITIVE (A) NEGATIVE   Influenza B by PCR NEGATIVE NEGATIVE   Resp Syncytial Virus by PCR NEGATIVE NEGATIVE  Troponin I (High Sensitivity)     Status: Abnormal   Collection Time: 03/26/23  8:01 PM  Result Value Ref Range   Troponin I (High Sensitivity) 257 (HH) <18 ng/L   Basic Metabolic Panel: Recent Labs  Lab 03/26/23 1825  NA 133*  K 3.9  CL 96*  CO2 26  GLUCOSE 103*  BUN 16  CREATININE 1.08*  CALCIUM 8.8*   Liver Function Tests: No results for input(s): "AST", "ALT", "ALKPHOS", "BILITOT", "PROT", "ALBUMIN" in the last 168 hours. No results for input(s): "LIPASE", "AMYLASE" in the last 168 hours. No results for input(s): "AMMONIA" in the last 168 hours. CBC: Recent Labs  Lab 03/26/23 1825  WBC 8.1  HGB 11.6*  HCT 35.4*  MCV 88.5  PLT 305   Cardiac Enzymes: Recent Labs  Lab 03/26/23 1825 03/26/23 2001  TROPONINIHS 229* 257*    BNP (last 3 results) No results for input(s): "PROBNP" in the last 8760 hours. CBG: No results for input(s): "GLUCAP" in the last 168 hours.  Radiological Exams on Admission:  DG Chest 2 View Result Date: 03/26/2023 CLINICAL DATA:  Cough and shortness of breath. EXAM: CHEST - 2 VIEW COMPARISON:  September 14, 2021 FINDINGS: The heart size and mediastinal contours are within normal limits. Very mild atelectasis and/or infiltrate is noted within the left lung base. There is no evidence of a pleural effusion or pneumothorax. Radiopaque surgical  clips are seen within the right upper quadrant. The visualized skeletal structures are unremarkable. IMPRESSION: Very mild left basilar atelectasis and/or infiltrate. A component of overlying  breast attenuation artifact cannot be excluded. Follow-up to resolution is recommended. Electronically Signed   By: Aram Candela M.D.   On: 03/26/2023 20:18    chest X-ray  No intake/output data recorded. No intake/output data recorded.        Assessment and Plan: * COPD with acute exacerbation (HCC) A.w. hypoxia. S.p salumderol and duoneb. Will c.w. same. Streoid for 5 doses systemically. Breo (whic patient is not taking at home) restarted. Transition care conulst placed to see if patinet can buy same or alternative (budesondie-formetrol)  Patinet is influenza A +ve. CXR show infiltrate. Pneumonia Will rx with tamiflu+ceftriaxone. C.w. azithromcyin. No sepsis. No cultures  Troponin I above reference range I will conitnue to trend this. 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 Check echo in AM. No significant ST_T wave changes.  Empiric aspirin, statin X 1 dose.. No betablocker due to resp issues.   Med rec pending pharmcy input   Advance Care Planning:   Code Status: Prior patient wishes to be DNR - consistent with her prior code statuses.  Consults: none  Family Communication: per patient.  Severity of Illness: The appropriate patient status for this patient is INPATIENT. Inpatient status is judged to be reasonable and necessary in order to provide the required intensity of service to ensure the patient's safety. The patient's presenting symptoms, physical exam findings, and initial radiographic and laboratory data in the context of their chronic comorbidities is felt to place them at high risk for further clinical deterioration. Furthermore, it is not anticipated that the patient will be medically stable for discharge from the hospital within 2 midnights of admission.   * I certify that at the point of admission it is my clinical judgment that the patient will require inpatient hospital care spanning beyond 2 midnights  from the point of admission due to high intensity of service, high risk for further deterioration and high frequency of surveillance required.*  Author: Nolberto Hanlon, MD 03/26/2023 9:17 PM  For on call review [email or web address].

## 2023-03-26 NOTE — ED Triage Notes (Signed)
 Pt to triage via wheelchair from [facility] for eval of sob.Marland Kitchen  Pt reports sob for days.  Pt has a cough.  No chest pain.  Former smoker.  No n/v/d   pt alert  speech clear.

## 2023-03-26 NOTE — Assessment & Plan Note (Signed)
 A.w. hypoxia. S.p salumderol and duoneb. Will c.w. same. Streoid for 5 doses systemically. Breo (whic patient is not taking at home) restarted. Transition care conulst placed to see if patinet can buy same or alternative (budesondie-formetrol)  Patinet is influenza A +ve. CXR show infiltrate. Pneumonia Will rx with tamiflu+ceftriaxone. C.w. azithromcyin. No sepsis. No cultures

## 2023-03-26 NOTE — ED Provider Notes (Signed)
 [facility] Provider Note   Event Date/Time   First MD Initiated Contact with Patient 03/26/23 1932     (approximate) History  Shortness of Breath  HPI Joan [name] is a 73 y.o. female with a stated past medical history of COPD, hyperlipidemia, hypertension, and continued tobacco abuse who presents from [facility] due to shortness of breath over the last 2 days with accompanying 80% on room air.  Patient also endorses subjective fevers.  Patient denies any recent travel or sick contacts.  Patient states she has been using her inhaler on time and as prescribed. ROS: Patient currently denies any vision changes, tinnitus, difficulty speaking, facial droop, sore throat, chest pain, abdominal pain, nausea/vomiting/diarrhea, dysuria, or weakness/numbness/paresthesias in any extremity   Physical Exam  Triage Vital Signs: ED Triage Vitals [03/26/23 1819]  Encounter Vitals Group     BP (!) 147/61     Systolic BP Percentile      Diastolic BP Percentile      Pulse Rate 82     Resp (!) 22     Temp 99.4 F (37.4 C)     Temp Source Oral     SpO2 90 %     Weight 190 lb (86.2 kg)     Height 5\' 4"  (1.626 m)     Head Circumference      Peak Flow      Pain Score 0     Pain Loc      Pain Education      Exclude from Growth Chart    Most recent vital signs: Vitals:   03/26/23 1819  BP: (!) 147/61  Pulse: 82  Resp: (!) 22  Temp: 99.4 F (37.4 C)  SpO2: 90%   General: Awake, oriented x4. CV:  Good peripheral perfusion.  Resp:  Increased effort.  Inspiratory and expiratory wheezing over bilateral lung fields Abd:  No distention.  Other:  Elderly obese Caucasian female resting in stretcher moderate respiratory distress ED Results / Procedures / Treatments  Labs (all labs ordered are listed, but only abnormal results are displayed) Labs Reviewed  RESP PANEL BY RT-PCR (RSV, FLU A&B, COVID)  [id number] - Abnormal; Notable for the following components:      Result  Value   Influenza A by PCR POSITIVE (*)    All other components within normal limits  BASIC METABOLIC PANEL - Abnormal; Notable for the following components:   Sodium 133 (*)    Chloride 96 (*)    Glucose, Bld 103 (*)    Creatinine, Ser 1.08 (*)    Calcium 8.8 (*)    GFR, Estimated 55 (*)    All other components within normal limits  CBC - Abnormal; Notable for the following components:   Hemoglobin 11.6 (*)    HCT 35.4 (*)    All other components within normal limits  BRAIN NATRIURETIC PEPTIDE - Abnormal; Notable for the following components:   B Natriuretic Peptide 172.8 (*)    All other components within normal limits  TROPONIN I (HIGH SENSITIVITY) - Abnormal; Notable for the following components:   Troponin I (High Sensitivity) 229 (*)    All other components within normal limits  TROPONIN I (HIGH SENSITIVITY) - Abnormal; Notable for the following components:   Troponin I (High Sensitivity) 257 (*)    All other components within normal limits  PROTIME-INR  APTT  PROTIME-INR  BASIC METABOLIC PANEL  CBC  TROPONIN I (HIGH SENSITIVITY)   EKG ED ECG  REPORT I, Merwyn Katos, the attending physician, personally viewed and interpreted this ECG. Date: 03/26/2023 EKG Time: 1821 Rate: 80 Rhythm: normal sinus rhythm QRS Axis: normal Intervals: normal ST/T Wave abnormalities: normal Narrative Interpretation: no evidence of acute ischemia RADIOLOGY ED MD interpretation: 2 view chest x-ray independently interpreted and shows mild left basilar atelectasis versus infiltrate -Agree with radiology assessment Official radiology report(s): DG Chest 2 View Result Date: 03/26/2023 CLINICAL DATA:  Cough and shortness of breath. EXAM: CHEST - 2 VIEW COMPARISON:  September 14, 2021 FINDINGS: The heart size and mediastinal contours are within normal limits. Very mild atelectasis and/or infiltrate is noted within the left lung base. There is no evidence of a pleural effusion or pneumothorax.  Radiopaque surgical clips are seen within the right upper quadrant. The visualized skeletal structures are unremarkable. IMPRESSION: Very mild left basilar atelectasis and/or infiltrate. A component of overlying breast attenuation artifact cannot be excluded. Follow-up to resolution is recommended. Electronically Signed   By: Aram Candela M.D.   On: 03/26/2023 20:18   PROCEDURES: Critical Care performed: Yes, see critical care procedure note(s) .1-3 Lead EKG Interpretation  Performed by: Merwyn Katos, MD Authorized by: Merwyn Katos, MD     Interpretation: normal     ECG rate:  71   ECG rate assessment: normal     Rhythm: sinus rhythm     Ectopy: none     Conduction: normal   CRITICAL CARE Performed by: Merwyn Katos  Total critical care time: 31 minutes  Critical care time was exclusive of separately billable procedures and treating other patients.  Critical care was necessary to treat or prevent imminent or life-threatening deterioration.  Critical care was time spent personally by me on the following activities: development of treatment plan with patient and/or surrogate as well as nursing, discussions with consultants, evaluation of patient's response to treatment, examination of patient, obtaining history from patient or surrogate, ordering and performing treatments and interventions, ordering and review of laboratory studies, ordering and review of radiographic studies, pulse oximetry and re-evaluation of patient's condition.  MEDICATIONS ORDERED IN ED: Medications  azithromycin (ZITHROMAX) 500 mg in sodium chloride 0.9 % 250 mL IVPB (500 mg Intravenous New Bag/Given 03/26/23 2105)  fluticasone furoate-vilanterol (BREO ELLIPTA) 200-25 MCG/ACT 1 puff (has no administration in time range)  methylPREDNISolone sodium succinate (SOLU-MEDROL) 125 mg/2 mL injection 60 mg (has no administration in time range)  ipratropium-albuterol (DUONEB) 0.5-2.5 (3) MG/3ML nebulizer solution 3  mL (3 mLs Nebulization Given 03/26/23 2139)  cefTRIAXone (ROCEPHIN) 1 g in sodium chloride 0.9 % 100 mL IVPB (has no administration in time range)  azithromycin (ZITHROMAX) 500 mg in sodium chloride 0.9 % 250 mL IVPB (has no administration in time range)  oseltamivir (TAMIFLU) capsule 30 mg (has no administration in time range)  oseltamivir (TAMIFLU) capsule 75 mg (has no administration in time range)  aspirin chewable tablet 324 mg (has no administration in time range)  atorvastatin (LIPITOR) tablet 40 mg (has no administration in time range)  pantoprazole (PROTONIX) EC tablet 40 mg (has no administration in time range)  enoxaparin (LOVENOX) injection 40 mg (has no administration in time range)  acetaminophen (TYLENOL) tablet 650 mg (has no administration in time range)    Or  acetaminophen (TYLENOL) suppository 650 mg (has no administration in time range)  polyethylene glycol (MIRALAX / GLYCOLAX) packet 17 g (has no administration in time range)  sodium chloride flush (NS) 0.9 % injection 3 mL (has no administration in  time range)  ipratropium-albuterol (DUONEB) 0.5-2.5 (3) MG/3ML nebulizer solution 6 mL (6 mLs Nebulization Given 03/26/23 2003)  methylPREDNISolone sodium succinate (SOLU-MEDROL) 125 mg/2 mL injection 125 mg (125 mg Intravenous Given 03/26/23 2003)   IMPRESSION / MDM / ASSESSMENT AND PLAN / ED COURSE  I reviewed the triage vital signs and the nursing notes.                             The patient is on the cardiac monitor to evaluate for evidence of arrhythmia and/or significant heart rate changes. Patient's presentation is most consistent with acute presentation with potential threat to life or bodily function. The patient appears to be suffering from a moderate/severe exacerbation of COPD.  Based on the history, exam, CXR/EKG reviewed by me, and further workup I don't suspect any other emergent cause of this presentation, such as pneumonia, acute coronary syndrome, congestive  heart failure, pulmonary embolism, or pneumothorax.  ED Interventions: bronchodilators, steroids, antibiotics, reassess  Reassessment: After treatment, the patient's shortness of breath is improving but patient is still requiring supplemental oxygenation   Disposition: Admit   FINAL CLINICAL IMPRESSION(S) / ED DIAGNOSES   Final diagnoses:  COPD exacerbation (HCC)  Acute respiratory failure with hypoxia (HCC)  Influenza   Rx / DC Orders   ED Discharge Orders     None      Note:  This document was prepared using Dragon voice recognition software and may include unintentional dictation errors.   Merwyn Katos, MD 03/26/23 [phone]

## 2023-03-27 ENCOUNTER — Inpatient Hospital Stay
Admit: 2023-03-27 | Discharge: 2023-03-27 | Disposition: A | Discharge status: Home / Self Care | Attending: Internal Medicine

## 2023-03-27 DIAGNOSIS — E6609 Other obesity due to excess calories: Secondary | ICD-10-CM

## 2023-03-27 DIAGNOSIS — E66811 Obesity, class 1: Secondary | ICD-10-CM

## 2023-03-27 DIAGNOSIS — R0602 Shortness of breath: Secondary | ICD-10-CM | POA: Diagnosis not present

## 2023-03-27 DIAGNOSIS — R7989 Other specified abnormal findings of blood chemistry: Secondary | ICD-10-CM

## 2023-03-27 DIAGNOSIS — Z6831 Body mass index (BMI) 31.0-31.9, adult: Secondary | ICD-10-CM

## 2023-03-27 DIAGNOSIS — I739 Peripheral vascular disease, unspecified: Secondary | ICD-10-CM

## 2023-03-27 DIAGNOSIS — J441 Chronic obstructive pulmonary disease with (acute) exacerbation: Secondary | ICD-10-CM | POA: Diagnosis not present

## 2023-03-27 DIAGNOSIS — I10 Essential (primary) hypertension: Secondary | ICD-10-CM | POA: Diagnosis not present

## 2023-03-27 LAB — CBC
HCT: 35.3 % — ABNORMAL LOW (ref 36.0–46.0)
Hemoglobin: 11.3 g/dL — ABNORMAL LOW (ref 12.0–15.0)
MCH: 28 pg (ref 26.0–34.0)
MCHC: 32 g/dL (ref 30.0–36.0)
MCV: 87.4 fL (ref 80.0–100.0)
Platelets: 313 10*3/uL (ref 150–400)
RBC: 4.04 MIL/uL (ref 3.87–5.11)
RDW: 13.6 % (ref 11.5–15.5)
WBC: 7.6 10*3/uL (ref 4.0–10.5)
nRBC: 0 % (ref 0.0–0.2)

## 2023-03-27 LAB — PROTIME-INR
INR: 1.1 (ref 0.8–1.2)
Prothrombin Time: 14 s (ref 11.4–15.2)

## 2023-03-27 LAB — BASIC METABOLIC PANEL
Anion gap: 8 (ref 5–15)
BUN: 19 mg/dL (ref 8–23)
CO2: 26 mmol/L (ref 22–32)
Calcium: 8.5 mg/dL — ABNORMAL LOW (ref 8.9–10.3)
Chloride: 98 mmol/L (ref 98–111)
Creatinine, Ser: 1.18 mg/dL — ABNORMAL HIGH (ref 0.44–1.00)
GFR, Estimated: 50 mL/min — ABNORMAL LOW (ref >60)
Glucose, Bld: 162 mg/dL — ABNORMAL HIGH (ref 70–99)
Potassium: 3.9 mmol/L (ref 3.5–5.1)
Sodium: 132 mmol/L — ABNORMAL LOW (ref 135–145)

## 2023-03-27 LAB — ECHOCARDIOGRAM COMPLETE
AR max vel: 2.6 cm2
AV Area VTI: 2.44 cm2
AV Area mean vel: 2.35 cm2
AV Mean grad: 6 mmHg
AV Peak grad: 12.1 mmHg
Ao pk vel: 1.74 m/s
Area-P 1/2: 5.13 cm2
Calc EF: 57.4 %
Height: 64 in
MV VTI: 2.78 cm2
S' Lateral: 2.5 cm
Single Plane A2C EF: 63 %
Single Plane A4C EF: 54.6 %
Weight: 2973.56 [oz_av]

## 2023-03-27 LAB — PROCALCITONIN: Procalcitonin: 0.22 ng/mL

## 2023-03-27 LAB — APTT: aPTT: 31 s (ref 24–36)

## 2023-03-27 MED ORDER — GUAIFENESIN 100 MG/5ML PO LIQD
5.0000 mL | ORAL | Status: DC | PRN
  Administered 2023-03-27 – 2023-03-29 (×4): 5 mL via ORAL
  Filled 2023-03-27 (×4): qty 10

## 2023-03-27 MED ORDER — AMLODIPINE BESYLATE 10 MG PO TABS
10.0000 mg | ORAL_TABLET | Freq: Every day | ORAL | Status: DC
  Administered 2023-03-27 – 2023-03-31 (×5): 10 mg via ORAL
  Filled 2023-03-27 (×5): qty 1

## 2023-03-27 MED ORDER — LOSARTAN POTASSIUM 50 MG PO TABS
50.0000 mg | ORAL_TABLET | Freq: Every day | ORAL | Status: DC
  Administered 2023-03-27 – 2023-03-31 (×5): 50 mg via ORAL
  Filled 2023-03-27 (×5): qty 1

## 2023-03-27 MED ORDER — ROSUVASTATIN CALCIUM 5 MG PO TABS
5.0000 mg | ORAL_TABLET | Freq: Every day | ORAL | Status: DC
  Administered 2023-03-27 – 2023-03-31 (×5): 5 mg via ORAL
  Filled 2023-03-27 (×5): qty 1

## 2023-03-27 MED ORDER — ASPIRIN 81 MG PO TBEC
81.0000 mg | DELAYED_RELEASE_TABLET | Freq: Every day | ORAL | Status: DC
  Administered 2023-03-27 – 2023-03-31 (×5): 81 mg via ORAL
  Filled 2023-03-27 (×5): qty 1

## 2023-03-27 MED ORDER — ALPRAZOLAM 0.5 MG PO TABS
0.5000 mg | ORAL_TABLET | Freq: Two times a day (BID) | ORAL | Status: DC | PRN
  Administered 2023-03-27: 0.5 mg via ORAL
  Filled 2023-03-27: qty 1

## 2023-03-27 MED ORDER — IPRATROPIUM-ALBUTEROL 0.5-2.5 (3) MG/3ML IN SOLN
3.0000 mL | Freq: Four times a day (QID) | RESPIRATORY_TRACT | Status: DC
  Administered 2023-03-27 – 2023-03-28 (×3): 3 mL via RESPIRATORY_TRACT
  Filled 2023-03-27 (×4): qty 3

## 2023-03-27 MED ORDER — SERTRALINE HCL 50 MG PO TABS
100.0000 mg | ORAL_TABLET | Freq: Every day | ORAL | Status: DC
  Administered 2023-03-27 – 2023-03-31 (×5): 100 mg via ORAL
  Filled 2023-03-27 (×5): qty 2

## 2023-03-27 MED ORDER — FLUTICASONE FUROATE-VILANTEROL 200-25 MCG/ACT IN AEPB
1.0000 | INHALATION_SPRAY | Freq: Every day | RESPIRATORY_TRACT | Status: DC
  Administered 2023-03-27 – 2023-03-31 (×5): 1 via RESPIRATORY_TRACT
  Filled 2023-03-27: qty 28

## 2023-03-27 MED ORDER — CLOPIDOGREL BISULFATE 75 MG PO TABS
75.0000 mg | ORAL_TABLET | Freq: Every day | ORAL | Status: DC
  Administered 2023-03-27 – 2023-03-31 (×5): 75 mg via ORAL
  Filled 2023-03-27 (×5): qty 1

## 2023-03-27 MED ORDER — LORATADINE 10 MG PO TABS: 10.0000 mg | ORAL_TABLET | Freq: Every day | ORAL | Status: DC | PRN

## 2023-03-27 NOTE — Plan of Care (Signed)

## 2023-03-27 NOTE — Progress Notes (Signed)
 Progress Note   Patient: Joan [name]:[id number] DOB: [birth year] DOA: 03/26/2023     1 DOS: the patient was seen and examined on 03/27/2023   Brief hospital course: Joan [name] is a 73 year old female with past medical history significant for right breast cancer, anxiety, hypertension, COPD, peripheral vascular disease, TIA presented to the emergency department for evaluation of shortness of breath, chest tightness on exertion.  Patient was noted to be hypoxic requiring 2 L supplemental oxygen.  Admitted to the hospitalist service for further management evaluation of COPD exacerbation.  She tested positive for influenza A.  Chest x-ray question infiltrate versus atelectasis.  Assessment and Plan: * COPD with acute exacerbation (HCC) Influenza A infection Flu possibly causing her COPD flareup. Continue supplemental oxygen to maintain saturation greater than 92%. Patient will be continued on DuoNeb every 6 hourly as needed. Continue Solu-Medrol 60 mg IV daily and gradually taper to oral once her symptoms better. Patient will be continued on Tamiflu therapy. Continue Rocephin and azithromycin.  Procalcitonin ordered to the prior labs. Monitor vitals, saturations closely.  Troponin I above reference range Prior troponins reviewed were elevated. Elevated troponins in the setting of hypoxia. Echocardiogram pending. Resumed home aspirin, Plavix, statin therapy.  Hypertension: Blood pressure stable. Continue losartan, Norvasc therapy.  PVD: Continue aspirin, Plavix and statin therapy. Outpatient vascular surgery follow-up.  Obesity class I BMI 31.9. Diet, exercise and weight reduction advised.  GERD: Continue pantoprazole therapy.  Anxiety: On Xanax 2 times daily as needed.     Out of bed to chair. Incentive spirometry. Nursing supportive care. Fall, aspiration precautions. DVT prophylaxis   Code Status: Do not attempt resuscitation (DNR) PRE-ARREST INTERVENTIONS  DESIRED Subjective: Patient is seen and examined today morning.  She is lying comfortably, requiring 2 L supplemental oxygen.  Did not get out of bed.  Eating fair.  Physical Exam: Vitals:   03/27/23 0052 03/27/23 0331 03/27/23 0748 03/27/23 0821  BP:   (!) 142/62   Pulse:   89 80  Resp:   19 16  Temp:   97.9 F (36.6 C)   TempSrc:      SpO2: 92% 91% 90% 90%  Weight:      Height:        General - Elderly obese Caucasian female, mild respiratory distress HEENT - PERRLA, EOMI, atraumatic head, non tender sinuses. Lung - Clear, basal rales, diffuse wheezes. Heart - S1, S2 heard, no murmurs, rubs, trace pedal edema. Abdomen - Soft, non tender obese, bowel sounds good Neuro - Alert, awake and oriented x 3, non focal exam. Skin - Warm and dry.  Data Reviewed:      Latest Ref Rng & Units 03/27/2023    4:52 AM 03/26/2023    6:25 PM 10/23/2022    4:45 AM  CBC  WBC 4.0 - 10.5 K/uL 7.6  8.1  11.2   Hemoglobin 12.0 - 15.0 g/dL 95.6  21.3  08.6   Hematocrit 36.0 - 46.0 % 35.3  35.4  39.2   Platelets 150 - 400 K/uL 313  305  309       Latest Ref Rng & Units 03/27/2023    4:52 AM 03/26/2023    6:25 PM 10/23/2022    4:45 AM  BMP  Glucose 70 - 99 mg/dL 578  469  629   BUN 8 - 23 mg/dL 19  16  18    Creatinine 0.44 - 1.00 mg/dL 5.28  4.13  2.44   Sodium 135 - 145 mmol/L  132  133  137   Potassium 3.5 - 5.1 mmol/L 3.9  3.9  3.9   Chloride 98 - 111 mmol/L 98  96  103   CO2 22 - 32 mmol/L 26  26  26    Calcium 8.9 - 10.3 mg/dL 8.5  8.8  8.9    DG Chest 2 View Result Date: 03/26/2023 CLINICAL DATA:  Cough and shortness of breath. EXAM: CHEST - 2 VIEW COMPARISON:  September 14, 2021 FINDINGS: The heart size and mediastinal contours are within normal limits. Very mild atelectasis and/or infiltrate is noted within the left lung base. There is no evidence of a pleural effusion or pneumothorax. Radiopaque surgical clips are seen within the right upper quadrant. The visualized skeletal structures are  unremarkable. IMPRESSION: Very mild left basilar atelectasis and/or infiltrate. A component of overlying breast attenuation artifact cannot be excluded. Follow-up to resolution is recommended. Electronically Signed   By: Aram Candela M.D.   On: 03/26/2023 20:18   Family Communication: Discussed with patient, she understand and agree. All questions answereed.  Disposition: Status is: Inpatient Remains inpatient appropriate because: wean O2, IV steroids  Planned Discharge Destination: Home     Time spent: 38 minutes  Author: Marcelino Duster, MD 03/27/2023 11:28 AM Secure chat 7am to 7pm For on call review [email or web address].

## 2023-03-27 NOTE — Progress Notes (Signed)
*  PRELIMINARY RESULTS* Echocardiogram 2D Echocardiogram has been performed.  Carolyne Fiscal 03/27/2023, 3:34 PM

## 2023-03-28 DIAGNOSIS — E6609 Other obesity due to excess calories: Secondary | ICD-10-CM | POA: Diagnosis not present

## 2023-03-28 DIAGNOSIS — Z6831 Body mass index (BMI) 31.0-31.9, adult: Secondary | ICD-10-CM | POA: Diagnosis not present

## 2023-03-28 DIAGNOSIS — I739 Peripheral vascular disease, unspecified: Secondary | ICD-10-CM | POA: Diagnosis not present

## 2023-03-28 DIAGNOSIS — I10 Essential (primary) hypertension: Secondary | ICD-10-CM | POA: Diagnosis not present

## 2023-03-28 DIAGNOSIS — J441 Chronic obstructive pulmonary disease with (acute) exacerbation: Secondary | ICD-10-CM | POA: Diagnosis not present

## 2023-03-28 DIAGNOSIS — E66811 Obesity, class 1: Secondary | ICD-10-CM | POA: Diagnosis not present

## 2023-03-28 DIAGNOSIS — R7989 Other specified abnormal findings of blood chemistry: Secondary | ICD-10-CM | POA: Diagnosis not present

## 2023-03-28 MED ORDER — IPRATROPIUM-ALBUTEROL 0.5-2.5 (3) MG/3ML IN SOLN
3.0000 mL | Freq: Three times a day (TID) | RESPIRATORY_TRACT | Status: DC
  Administered 2023-03-28 – 2023-03-30 (×6): 3 mL via RESPIRATORY_TRACT
  Filled 2023-03-28 (×6): qty 3

## 2023-03-28 NOTE — Plan of Care (Signed)

## 2023-03-28 NOTE — Progress Notes (Signed)
 Progress Note   Patient: Joan [name] UEA:[id number] DOB: February 23, 1950 DOA: 03/26/2023     2 DOS: the patient was seen and examined on 03/28/2023   Brief hospital course: Joan [name] is a 73 year old female with past medical history significant for right breast cancer, anxiety, hypertension, COPD, peripheral vascular disease, TIA presented to the emergency department for evaluation of shortness of breath, chest tightness on exertion.  Patient was noted to be hypoxic requiring 2 L supplemental oxygen.  Admitted to the hospitalist service for further management evaluation of COPD exacerbation.  She tested positive for influenza A.  Chest x-ray question infiltrate versus atelectasis.  Assessment and Plan: * COPD with acute exacerbation (HCC) Influenza A infection Flu infection caused her COPD flareup. Continue supplemental oxygen to maintain saturation greater than 92%. Continue DuoNeb every 6 hourly as needed. Continue Solu-Medrol 60 mg IV daily and gradually taper to oral once her symptoms better. Patient will be continued on Tamiflu therapy. Continue Rocephin and azithromycin for 1 more day. O2 home qualify tomorrow prior to dc  Troponin I above reference range Prior troponins reviewed were elevated. Elevated troponins in the setting of hypoxia. Echocardiogram EF normal, no wall motion abnormalities. Resumed home aspirin, Plavix, statin therapy.  Hypertension: Blood pressure stable. Continue losartan, Norvasc therapy.  PVD: Continue aspirin, Plavix and statin therapy. Outpatient vascular surgery follow-up.  Obesity class I BMI 31.9. Diet, exercise and weight reduction advised.  GERD: Continue pantoprazole therapy.  Anxiety: On Xanax 2 times daily as needed.     Out of bed to chair. Incentive spirometry. Nursing supportive care. Fall, aspiration precautions. DVT prophylaxis   Code Status: Do not attempt resuscitation (DNR) PRE-ARREST INTERVENTIONS DESIRED  Subjective:  Patient is seen and examined today morning.  She is lying comfortably, feels better, on 2 L supplemental oxygen. Able to get out of bed. Eating fair.  Physical Exam: Vitals:   03/28/23 0743 03/28/23 0750 03/28/23 1340 03/28/23 1533  BP: 128/74   (!) 111/56  Pulse: 69   72  Resp: 18   16  Temp: 97.8 F (36.6 C)   97.8 F (36.6 C)  TempSrc:      SpO2: 91% 96% 90% 93%  Weight:      Height:        General - Elderly obese Caucasian female, mild respiratory distress HEENT - PERRLA, EOMI, atraumatic head, non tender sinuses. Lung - Clear, basal rales, diffuse wheezes improved. Heart - S1, S2 heard, no murmurs, rubs, trace pedal edema. Abdomen - Soft, non tender obese, bowel sounds good Neuro - Alert, awake and oriented x 3, non focal exam. Skin - Warm and dry.  Data Reviewed:      Latest Ref Rng & Units 03/27/2023    4:52 AM 03/26/2023    6:25 PM 10/23/2022    4:45 AM  CBC  WBC 4.0 - 10.5 K/uL 7.6  8.1  11.2   Hemoglobin 12.0 - 15.0 g/dL 47.8  29.5  62.1   Hematocrit 36.0 - 46.0 % 35.3  35.4  39.2   Platelets 150 - 400 K/uL 313  305  309       Latest Ref Rng & Units 03/27/2023    4:52 AM 03/26/2023    6:25 PM 10/23/2022    4:45 AM  BMP  Glucose 70 - 99 mg/dL 308  657  846   BUN 8 - 23 mg/dL 19  16  18    Creatinine 0.44 - 1.00 mg/dL 9.62  9.52  8.41  Sodium 135 - 145 mmol/L 132  133  137   Potassium 3.5 - 5.1 mmol/L 3.9  3.9  3.9   Chloride 98 - 111 mmol/L 98  96  103   CO2 22 - 32 mmol/L 26  26  26    Calcium 8.9 - 10.3 mg/dL 8.5  8.8  8.9    ECHOCARDIOGRAM COMPLETE Result Date: 03/27/2023    ECHOCARDIOGRAM REPORT   Patient Name:   Joan [name] Date of Exam: 03/27/2023 Medical Rec #:  [id number]      Height:       64.0 in Accession #:    [id number]     Weight:       185.8 lb Date of Birth:  07/13/1950      BSA:          1.896 m Patient Age:    72 years       BP:           142/62 mmHg Patient Gender: F              HR:           85 bpm. Exam Location:  [facility] Procedure: 2D Echo,  Cardiac Doppler, Color Doppler and Strain Analysis (Both            Spectral and Color Flow Doppler were utilized during procedure). Indications:     NSTEMI  History:         Patient has prior history of Echocardiogram examinations, most                  recent 09/15/2021. Acute MI, PAD, COPD and TIA; Risk                  Factors:Hypertension, Dyslipidemia and Current Smoker. ETOH                  abuse, Breast CA, Influenza +.  Sonographer:     Mikki Harbor Referring Phys:  [id number] [facility] GOEL Diagnosing Phys: Julien Nordmann MD  Sonographer Comments: Technically difficult study due to poor echo windows and suboptimal parasternal window. Image acquisition challenging due to COPD. IMPRESSIONS  1. Left ventricular ejection fraction, by estimation, is 60 to 65%. The left ventricle has normal function. The left ventricle has no regional wall motion abnormalities. Left ventricular diastolic parameters were normal. Suboptimal strain pattern tracking.  2. Right ventricular systolic function is normal. The right ventricular size is normal.  3. The mitral valve is normal in structure. Mild to moderate mitral valve regurgitation. No evidence of mitral stenosis.  4. The aortic valve has an indeterminant number of cusps. Aortic valve regurgitation is not visualized. No aortic stenosis is present.  5. The inferior vena cava is normal in size with greater than 50% respiratory variability, suggesting right atrial pressure of 3 mmHg. FINDINGS  Left Ventricle: Left ventricular ejection fraction, by estimation, is 60 to 65%. The left ventricle has normal function. The left ventricle has no regional wall motion abnormalities. Global longitudinal strain performed but not reported based on interpreter judgement due to suboptimal tracking. The left ventricular internal cavity size was normal in size. There is no left ventricular hypertrophy. Left ventricular diastolic parameters were normal. Right Ventricle: The right ventricular size  is normal. No increase in right ventricular wall thickness. Right ventricular systolic function is normal. Left Atrium: Left atrial size was normal in size. Right Atrium: Right atrial size was normal in size. Pericardium: There is no evidence of pericardial  effusion. Mitral Valve: The mitral valve is normal in structure. There is mild calcification of the mitral valve leaflet(s). Mild mitral annular calcification. Mild to moderate mitral valve regurgitation. No evidence of mitral valve stenosis. MV peak gradient, 7.2  mmHg. The mean mitral valve gradient is 3.0 mmHg. Tricuspid Valve: The tricuspid valve is normal in structure. Tricuspid valve regurgitation is not demonstrated. No evidence of tricuspid stenosis. Aortic Valve: The aortic valve has an indeterminant number of cusps. Aortic valve regurgitation is not visualized. No aortic stenosis is present. Aortic valve mean gradient measures 6.0 mmHg. Aortic valve peak gradient measures 12.1 mmHg. Aortic valve area, by VTI measures 2.44 cm. Pulmonic Valve: The pulmonic valve was normal in structure. Pulmonic valve regurgitation is not visualized. No evidence of pulmonic stenosis. Aorta: The aortic root is normal in size and structure. Venous: The inferior vena cava is normal in size with greater than 50% respiratory variability, suggesting right atrial pressure of 3 mmHg. IAS/Shunts: No atrial level shunt detected by color flow Doppler. Additional Comments: 3D was performed not requiring image post processing on an independent workstation and was indeterminate.  LEFT VENTRICLE PLAX 2D LVIDd:         5.00 cm     Diastology LVIDs:         2.50 cm     LV e' medial:    14.80 cm/s LV PW:         1.10 cm     LV E/e' medial:  8.4 LV IVS:        1.20 cm     LV e' lateral:   9.03 cm/s LVOT diam:     2.00 cm     LV E/e' lateral: 13.7 LV SV:         92 LV SV Index:   48          2D Longitudinal Strain LVOT Area:     3.14 cm    2D Strain GLS Avg:     -8.1 %  LV Volumes (MOD) LV  vol d, MOD A2C: 47.0 ml LV vol d, MOD A4C: 52.6 ml LV vol s, MOD A2C: 17.4 ml LV vol s, MOD A4C: 23.9 ml LV SV MOD A2C:     29.6 ml LV SV MOD A4C:     52.6 ml LV SV MOD BP:      28.6 ml RIGHT VENTRICLE RV Basal diam:  3.40 cm RV Mid diam:    3.10 cm RV S prime:     18.00 cm/s TAPSE (M-mode): 3.0 cm LEFT ATRIUM             Index        RIGHT ATRIUM           Index LA diam:        4.00 cm 2.11 cm/m   RA Area:     16.30 cm LA Vol (A2C):   62.0 ml 32.70 ml/m  RA Volume:   44.00 ml  23.20 ml/m LA Vol (A4C):   42.8 ml 22.57 ml/m LA Biplane Vol: 54.0 ml 28.48 ml/m  AORTIC VALVE                     PULMONIC VALVE AV Area (Vmax):    2.60 cm      PV Vmax:       1.39 m/s AV Area (Vmean):   2.35 cm      PV Peak grad:  7.7 mmHg AV Area (VTI):  2.44 cm AV Vmax:           174.00 cm/s AV Vmean:          111.000 cm/s AV VTI:            0.376 m AV Peak Grad:      12.1 mmHg AV Mean Grad:      6.0 mmHg LVOT Vmax:         144.00 cm/s LVOT Vmean:        82.900 cm/s LVOT VTI:          0.292 m LVOT/AV VTI ratio: 0.78  AORTA Ao Root diam: 3.20 cm MITRAL VALVE MV Area (PHT): 5.13 cm     SHUNTS MV Area VTI:   2.78 cm     Systemic VTI:  0.29 m MV Peak grad:  7.2 mmHg     Systemic Diam: 2.00 cm MV Mean grad:  3.0 mmHg MV Vmax:       1.34 m/s MV Vmean:      82.4 cm/s MV Decel Time: 148 msec MV E velocity: 124.00 cm/s MV A velocity: 104.00 cm/s MV E/A ratio:  1.19 Julien Nordmann MD Electronically signed by Julien Nordmann MD Signature Date/Time: 03/27/2023/4:08:43 PM    Final    DG Chest 2 View Result Date: 03/26/2023 CLINICAL DATA:  Cough and shortness of breath. EXAM: CHEST - 2 VIEW COMPARISON:  September 14, 2021 FINDINGS: The heart size and mediastinal contours are within normal limits. Very mild atelectasis and/or infiltrate is noted within the left lung base. There is no evidence of a pleural effusion or pneumothorax. Radiopaque surgical clips are seen within the right upper quadrant. The visualized skeletal structures are  unremarkable. IMPRESSION: Very mild left basilar atelectasis and/or infiltrate. A component of overlying breast attenuation artifact cannot be excluded. Follow-up to resolution is recommended. Electronically Signed   By: Aram Candela M.D.   On: 03/26/2023 20:18   Family Communication: Discussed with patient, she understand and agree. All questions answereed.  Disposition: Status is: Inpatient Remains inpatient appropriate because: wean O2, IV steroids  Planned Discharge Destination: Home     Time spent: 38 minutes  Author: Marcelino Duster, MD 03/28/2023 5:04 PM Secure chat 7am to 7pm For on call review [email or web address].

## 2023-03-28 NOTE — Plan of Care (Signed)

## 2023-03-29 DIAGNOSIS — J441 Chronic obstructive pulmonary disease with (acute) exacerbation: Secondary | ICD-10-CM | POA: Diagnosis not present

## 2023-03-29 DIAGNOSIS — I10 Essential (primary) hypertension: Secondary | ICD-10-CM | POA: Diagnosis not present

## 2023-03-29 DIAGNOSIS — E66811 Obesity, class 1: Secondary | ICD-10-CM | POA: Diagnosis not present

## 2023-03-29 DIAGNOSIS — J9601 Acute respiratory failure with hypoxia: Secondary | ICD-10-CM

## 2023-03-29 DIAGNOSIS — Z6831 Body mass index (BMI) 31.0-31.9, adult: Secondary | ICD-10-CM | POA: Diagnosis not present

## 2023-03-29 DIAGNOSIS — E6609 Other obesity due to excess calories: Secondary | ICD-10-CM | POA: Diagnosis not present

## 2023-03-29 DIAGNOSIS — J9621 Acute and chronic respiratory failure with hypoxia: Secondary | ICD-10-CM | POA: Diagnosis present

## 2023-03-29 DIAGNOSIS — R7989 Other specified abnormal findings of blood chemistry: Secondary | ICD-10-CM | POA: Diagnosis not present

## 2023-03-29 DIAGNOSIS — I739 Peripheral vascular disease, unspecified: Secondary | ICD-10-CM | POA: Diagnosis not present

## 2023-03-29 LAB — CBC
HCT: 34.1 % — ABNORMAL LOW (ref 36.0–46.0)
Hemoglobin: 11.1 g/dL — ABNORMAL LOW (ref 12.0–15.0)
MCH: 28.3 pg (ref 26.0–34.0)
MCHC: 32.6 g/dL (ref 30.0–36.0)
MCV: 87 fL (ref 80.0–100.0)
Platelets: 331 10*3/uL (ref 150–400)
RBC: 3.92 MIL/uL (ref 3.87–5.11)
RDW: 13.6 % (ref 11.5–15.5)
WBC: 12.3 10*3/uL — ABNORMAL HIGH (ref 4.0–10.5)
nRBC: 0 % (ref 0.0–0.2)

## 2023-03-29 LAB — BASIC METABOLIC PANEL
Anion gap: 10 (ref 5–15)
BUN: 31 mg/dL — ABNORMAL HIGH (ref 8–23)
CO2: 29 mmol/L (ref 22–32)
Calcium: 9.1 mg/dL (ref 8.9–10.3)
Chloride: 100 mmol/L (ref 98–111)
Creatinine, Ser: 1.01 mg/dL — ABNORMAL HIGH (ref 0.44–1.00)
GFR, Estimated: 60 mL/min — ABNORMAL LOW (ref >60)
Glucose, Bld: 98 mg/dL (ref 70–99)
Potassium: 4 mmol/L (ref 3.5–5.1)
Sodium: 136 mmol/L (ref 135–145)

## 2023-03-29 NOTE — Plan of Care (Signed)

## 2023-03-29 NOTE — Care Management Important Message (Signed)
 Important Message  Patient Details  Name: Joan [name] MRN: [id number] Date of Birth: October 18, 1950   Important Message Given:  Yes - Medicare IM     Cristela Blue, CMA 03/29/2023, 9:48 AM

## 2023-03-29 NOTE — Progress Notes (Addendum)
 Progress Note   Patient: Joan [name] XBJ:[id number] DOB: 09-12-1950 DOA: 03/26/2023     3 DOS: the patient was seen and examined on 03/29/2023   Brief hospital course: Malaney Mcbean is a 73 year old female with past medical history significant for right breast cancer, anxiety, hypertension, COPD, peripheral vascular disease, TIA presented to the emergency department for evaluation of shortness of breath, chest tightness on exertion.  Patient was noted to be hypoxic requiring 2 L supplemental oxygen. She tested positive for influenza A.  Chest x-ray question infiltrate versus atelectasis.  Admitted to the hospitalist service for further management evaluation of COPD exacerbation.    Assessment and Plan: * COPD with acute exacerbation (HCC) Influenza A infection Acute hypoxic respiratory failure Flu infection caused her COPD flareup. Continue supplemental oxygen to maintain saturation greater than 92%. She is on 2-3L supplemental O2. Continue DuoNeb every 6 hourly as needed. Continue Solu-Medrol 60 mg IV daily, taper to oral prednisone 40mg  tomorrow for 5 more days. Finish Tamiflu 5 day therapy. Finished 3 days of Rocephin, azithro. O2 home qualify tomorrow prior to dc. She wishes not to be on home O2 id possible.  Elevated Troponin Prior troponins reviewed were elevated. Elevated troponins in the setting of hypoxia. Echocardiogram EF normal, no wall motion abnormalities. Resumed home aspirin, Plavix, statin therapy.  Hypertension: Blood pressure stable. Continue losartan, Norvasc therapy.  PVD: Continue aspirin, Plavix and statin therapy. Outpatient vascular surgery follow-up.  Obesity class I BMI 31.9. Diet, exercise and weight reduction advised.  GERD: Continue pantoprazole therapy.  Anxiety: On Xanax 2 times daily as needed.     Out of bed to chair. Incentive spirometry. Nursing supportive care. Fall, aspiration precautions. DVT prophylaxis   Code Status: Do not  attempt resuscitation (DNR) PRE-ARREST INTERVENTIONS DESIRED  Subjective: Patient is seen and examined today morning.  She does feel short of breath today. Requiring 3L. Eating fair, able to get out of bed. Does not feel like going home.   Physical Exam: Vitals:   03/28/23 2052 03/28/23 2233 03/29/23 0839 03/29/23 1336  BP:  (!) 129/58 (!) 154/61   Pulse:  73 79   Resp:  16 20   Temp:  97.7 F (36.5 C) 97.7 F (36.5 C)   TempSrc:      SpO2: 91% 94% 94% 92%  Weight:      Height:        General - Elderly obese Caucasian female, mild respiratory distress HEENT - PERRLA, EOMI, atraumatic head, non tender sinuses. Lung - Clear, basal rales, diffuse wheezes. Heart - S1, S2 heard, no murmurs, rubs, trace pedal edema. Abdomen - Soft, non tender obese, bowel sounds good Neuro - Alert, awake and oriented x 3, non focal exam. Skin - Warm and dry.  Data Reviewed:      Latest Ref Rng & Units 03/29/2023    6:18 AM 03/27/2023    4:52 AM 03/26/2023    6:25 PM  CBC  WBC 4.0 - 10.5 K/uL 12.3  7.6  8.1   Hemoglobin 12.0 - 15.0 g/dL 30.8  65.7  84.6   Hematocrit 36.0 - 46.0 % 34.1  35.3  35.4   Platelets 150 - 400 K/uL 331  313  305       Latest Ref Rng & Units 03/29/2023    6:18 AM 03/27/2023    4:52 AM 03/26/2023    6:25 PM  BMP  Glucose 70 - 99 mg/dL 98  962  952   BUN 8 -  23 mg/dL 31  19  16    Creatinine 0.44 - 1.00 mg/dL 1.61  0.96  0.45   Sodium 135 - 145 mmol/L 136  132  133   Potassium 3.5 - 5.1 mmol/L 4.0  3.9  3.9   Chloride 98 - 111 mmol/L 100  98  96   CO2 22 - 32 mmol/L 29  26  26    Calcium 8.9 - 10.3 mg/dL 9.1  8.5  8.8    ECHOCARDIOGRAM COMPLETE Result Date: 03/27/2023    ECHOCARDIOGRAM REPORT   Patient Name:   CHRISTIONA SIDDIQUE Date of Exam: 03/27/2023 Medical Rec #:  [id number]      Height:       64.0 in Accession #:    [id number]     Weight:       185.8 lb Date of Birth:  1950/03/17      BSA:          1.896 m Patient Age:    72 years       BP:           142/62 mmHg Patient Gender:  F              HR:           85 bpm. Exam Location:  [facility] Procedure: 2D Echo, Cardiac Doppler, Color Doppler and Strain Analysis (Both            Spectral and Color Flow Doppler were utilized during procedure). Indications:     NSTEMI  History:         Patient has prior history of Echocardiogram examinations, most                  recent 09/15/2021. Acute MI, PAD, COPD and TIA; Risk                  Factors:Hypertension, Dyslipidemia and Current Smoker. ETOH                  abuse, Breast CA, Influenza +.  Sonographer:     Mikki Harbor Referring Phys:  [id number] [facility] GOEL Diagnosing Phys: Julien Nordmann MD  Sonographer Comments: Technically difficult study due to poor echo windows and suboptimal parasternal window. Image acquisition challenging due to COPD. IMPRESSIONS  1. Left ventricular ejection fraction, by estimation, is 60 to 65%. The left ventricle has normal function. The left ventricle has no regional wall motion abnormalities. Left ventricular diastolic parameters were normal. Suboptimal strain pattern tracking.  2. Right ventricular systolic function is normal. The right ventricular size is normal.  3. The mitral valve is normal in structure. Mild to moderate mitral valve regurgitation. No evidence of mitral stenosis.  4. The aortic valve has an indeterminant number of cusps. Aortic valve regurgitation is not visualized. No aortic stenosis is present.  5. The inferior vena cava is normal in size with greater than 50% respiratory variability, suggesting right atrial pressure of 3 mmHg. FINDINGS  Left Ventricle: Left ventricular ejection fraction, by estimation, is 60 to 65%. The left ventricle has normal function. The left ventricle has no regional wall motion abnormalities. Global longitudinal strain performed but not reported based on interpreter judgement due to suboptimal tracking. The left ventricular internal cavity size was normal in size. There is no left ventricular hypertrophy. Left ventricular  diastolic parameters were normal. Right Ventricle: The right ventricular size is normal. No increase in right ventricular wall thickness. Right ventricular systolic function is normal. Left Atrium: Left atrial  size was normal in size. Right Atrium: Right atrial size was normal in size. Pericardium: There is no evidence of pericardial effusion. Mitral Valve: The mitral valve is normal in structure. There is mild calcification of the mitral valve leaflet(s). Mild mitral annular calcification. Mild to moderate mitral valve regurgitation. No evidence of mitral valve stenosis. MV peak gradient, 7.2  mmHg. The mean mitral valve gradient is 3.0 mmHg. Tricuspid Valve: The tricuspid valve is normal in structure. Tricuspid valve regurgitation is not demonstrated. No evidence of tricuspid stenosis. Aortic Valve: The aortic valve has an indeterminant number of cusps. Aortic valve regurgitation is not visualized. No aortic stenosis is present. Aortic valve mean gradient measures 6.0 mmHg. Aortic valve peak gradient measures 12.1 mmHg. Aortic valve area, by VTI measures 2.44 cm. Pulmonic Valve: The pulmonic valve was normal in structure. Pulmonic valve regurgitation is not visualized. No evidence of pulmonic stenosis. Aorta: The aortic root is normal in size and structure. Venous: The inferior vena cava is normal in size with greater than 50% respiratory variability, suggesting right atrial pressure of 3 mmHg. IAS/Shunts: No atrial level shunt detected by color flow Doppler. Additional Comments: 3D was performed not requiring image post processing on an independent workstation and was indeterminate.  LEFT VENTRICLE PLAX 2D LVIDd:         5.00 cm     Diastology LVIDs:         2.50 cm     LV e' medial:    14.80 cm/s LV PW:         1.10 cm     LV E/e' medial:  8.4 LV IVS:        1.20 cm     LV e' lateral:   9.03 cm/s LVOT diam:     2.00 cm     LV E/e' lateral: 13.7 LV SV:         92 LV SV Index:   48          2D Longitudinal Strain  LVOT Area:     3.14 cm    2D Strain GLS Avg:     -8.1 %  LV Volumes (MOD) LV vol d, MOD A2C: 47.0 ml LV vol d, MOD A4C: 52.6 ml LV vol s, MOD A2C: 17.4 ml LV vol s, MOD A4C: 23.9 ml LV SV MOD A2C:     29.6 ml LV SV MOD A4C:     52.6 ml LV SV MOD BP:      28.6 ml RIGHT VENTRICLE RV Basal diam:  3.40 cm RV Mid diam:    3.10 cm RV S prime:     18.00 cm/s TAPSE (M-mode): 3.0 cm LEFT ATRIUM             Index        RIGHT ATRIUM           Index LA diam:        4.00 cm 2.11 cm/m   RA Area:     16.30 cm LA Vol (A2C):   62.0 ml 32.70 ml/m  RA Volume:   44.00 ml  23.20 ml/m LA Vol (A4C):   42.8 ml 22.57 ml/m LA Biplane Vol: 54.0 ml 28.48 ml/m  AORTIC VALVE                     PULMONIC VALVE AV Area (Vmax):    2.60 cm      PV Vmax:       1.39 m/s  AV Area (Vmean):   2.35 cm      PV Peak grad:  7.7 mmHg AV Area (VTI):     2.44 cm AV Vmax:           174.00 cm/s AV Vmean:          111.000 cm/s AV VTI:            0.376 m AV Peak Grad:      12.1 mmHg AV Mean Grad:      6.0 mmHg LVOT Vmax:         144.00 cm/s LVOT Vmean:        82.900 cm/s LVOT VTI:          0.292 m LVOT/AV VTI ratio: 0.78  AORTA Ao Root diam: 3.20 cm MITRAL VALVE MV Area (PHT): 5.13 cm     SHUNTS MV Area VTI:   2.78 cm     Systemic VTI:  0.29 m MV Peak grad:  7.2 mmHg     Systemic Diam: 2.00 cm MV Mean grad:  3.0 mmHg MV Vmax:       1.34 m/s MV Vmean:      82.4 cm/s MV Decel Time: 148 msec MV E velocity: 124.00 cm/s MV A velocity: 104.00 cm/s MV E/A ratio:  1.19 Julien Nordmann MD Electronically signed by Julien Nordmann MD Signature Date/Time: 03/27/2023/4:08:43 PM    Final    Family Communication: Discussed with patient, she understand and agree. All questions answereed.  Disposition: Status is: Inpatient Remains inpatient appropriate because: wean O2, IV steroid taper  Planned Discharge Destination: Home     Time spent: 38 minutes  Author: Marcelino Duster, MD 03/29/2023 2:26 PM Secure chat 7am to 7pm For on call review  [email or web address].

## 2023-03-29 NOTE — Plan of Care (Signed)

## 2023-03-29 NOTE — Progress Notes (Signed)
 Pt ambulated in room without oxygen, pt oxygen down to 88 with some worker of breathing.Pt placed back on 2L/[place] with  no further complications. Reva Bores 03/29/23 2:58 PM

## 2023-03-30 DIAGNOSIS — J441 Chronic obstructive pulmonary disease with (acute) exacerbation: Secondary | ICD-10-CM | POA: Diagnosis not present

## 2023-03-30 MED ORDER — PREDNISONE 20 MG PO TABS
40.0000 mg | ORAL_TABLET | Freq: Every day | ORAL | Status: AC
  Administered 2023-03-30 – 2023-03-31 (×2): 40 mg via ORAL
  Filled 2023-03-30 (×2): qty 2

## 2023-03-30 MED ORDER — IPRATROPIUM-ALBUTEROL 0.5-2.5 (3) MG/3ML IN SOLN
3.0000 mL | Freq: Two times a day (BID) | RESPIRATORY_TRACT | Status: DC
  Administered 2023-03-30 – 2023-03-31 (×2): 3 mL via RESPIRATORY_TRACT
  Filled 2023-03-30 (×2): qty 3

## 2023-03-30 NOTE — Plan of Care (Signed)

## 2023-03-30 NOTE — Progress Notes (Signed)
 Placed patient on room air while resting in bed. Oxygen saturations dropped to 89% on room air at rest. Attempted ambulation on room air, saturations immediately dropped to 80% on room air. Returned patient to bed and placed on 4 liters oxygen to recover saturations to 97%. Nurse then decreased oxygen to 2 liters and attempted ambulation again. Patient was able to ambulate on 2 liters of oxygen one complete walk around the nurses station with saturations at 94%.

## 2023-03-30 NOTE — Plan of Care (Signed)

## 2023-03-30 NOTE — Progress Notes (Signed)
 Progress Note    Joan [name]  HQI:[id number] DOB: 01-15-1951  DOA: 03/26/2023 PCP: Barbette Reichmann, MD      Brief Narrative:    Medical records reviewed and are as summarized below:  Joan [name] is a 73 y.o. female with past medical history significant for right breast cancer, anxiety, hypertension, COPD, peripheral vascular disease, TIA presented to the emergency department for evaluation of shortness of breath, chest tightness on exertion. Patient was noted to be hypoxic requiring 2 L supplemental oxygen. She tested positive for influenza A. Chest x-ray question infiltrate versus atelectasis. Admitted to the hospitalist service for further management evaluation of COPD exacerbation.        Assessment/Plan:   Principal Problem:   COPD with acute exacerbation (HCC) Active Problems:   Hyponatremia syndrome   Tobacco user   Benign essential hypertension   Anxiety and depression   Troponin I above reference range   COPD exacerbation (HCC)   Acute hypoxic respiratory failure (HCC)   Body mass index is 31.9 kg/m.   COPD with acute exacerbation (HCC) Influenza A infection Change IV Solu-Medrol to prednisone. Continue bronchodilators. Continue Tamiflu through 03/31/2023 Completed 3 days of IV ceftriaxone and azithromycin   Acute hypoxic respiratory failure Oxygen saturation dropped to 80% on room air with ambulation. Oxygen saturation improved to 94% on 2 L/min with ambulation. Suspect she may need home oxygen for chronic hypoxic respiratory failure.    Elevated Troponin Prior troponins reviewed were elevated. Elevated troponins in the setting of hypoxia. Echocardiogram EF normal, no wall motion abnormalities. Continue aspirin, Plavix and statin   Comorbidities include hypertension, PVD, GERD, anxiety, class I obesity          Diet Order             Diet regular Room service appropriate? Yes; Fluid consistency: Thin  Diet effective now                             Consultants: None  Procedures: None    Medications:    amLODipine  10 mg Oral Daily   aspirin EC  81 mg Oral Daily   clopidogrel  75 mg Oral Daily   enoxaparin (LOVENOX) injection  40 mg Subcutaneous Q24H   fluticasone furoate-vilanterol  1 puff Inhalation Daily   ipratropium-albuterol  3 mL Nebulization BID   losartan  50 mg Oral Daily   oseltamivir  30 mg Oral BID   pantoprazole  40 mg Oral Daily   predniSONE  40 mg Oral Q breakfast   rosuvastatin  5 mg Oral Daily   sertraline  100 mg Oral Daily   sodium chloride flush  3 mL Intravenous Q12H   Continuous Infusions:   Anti-infectives (From admission, onward)    Start     Dose/Rate Route Frequency Ordered Stop   03/27/23 1200  azithromycin (ZITHROMAX) 500 mg in sodium chloride 0.9 % 250 mL IVPB        500 mg 250 mL/hr over 60 Minutes Intravenous Every 24 hours 03/26/23 2130 03/29/23 1321   03/27/23 1000  oseltamivir (TAMIFLU) capsule 30 mg        30 mg Oral 2 times daily 03/26/23 2130 03/31/23 2159   03/26/23 2145  cefTRIAXone (ROCEPHIN) 1 g in sodium chloride 0.9 % 100 mL IVPB  Status:  Discontinued        1 g 200 mL/hr over 30 Minutes Intravenous Every 24 hours  03/26/23 2130 03/29/23 1430   03/26/23 2145  oseltamivir (TAMIFLU) capsule 75 mg        75 mg Oral  Once 03/26/23 2132 03/26/23 2156   03/26/23 2045  azithromycin (ZITHROMAX) 500 mg in sodium chloride 0.9 % 250 mL IVPB        500 mg 250 mL/hr over 60 Minutes Intravenous  Once 03/26/23 2033 03/26/23 2207              Family Communication/Anticipated D/C date and plan/Code Status   DVT prophylaxis: enoxaparin (LOVENOX) injection 40 mg Start: 03/27/23 0800 SCDs Start: 03/26/23 2136     Code Status: Do not attempt resuscitation (DNR) PRE-ARREST INTERVENTIONS DESIRED  Family Communication: None Disposition Plan: Plan to discharge home tomorrow   Status is: Inpatient Remains inpatient appropriate because: COPD  exacerbation, acute respiratory failure       Subjective:   Interval events noted.  She complains of dry cough, wheezing and shortness of breath with minimal exertion.  Objective:    Vitals:   03/29/23 1551 03/29/23 1930 03/29/23 2214 03/30/23 0805  BP: (!) 125/47  137/66 (!) 147/75  Pulse: 71  74 78  Resp: 16  18 17   Temp: 97.7 F (36.5 C)  98.1 F (36.7 C) 97.8 F (36.6 C)  TempSrc:   Oral   SpO2: 93% 94% 96% 92%  Weight:      Height:       No data found.   Intake/Output Summary (Last 24 hours) at 03/30/2023 1241 Last data filed at 03/29/2023 1500 Gross per 24 hour  Intake 0 ml  Output --  Net 0 ml   Filed Weights   03/26/23 1819 03/26/23 2253  Weight: 86.2 kg 84.3 kg    Exam:  GEN: NAD SKIN: Warm and dry EYES: No pallor or icterus ENT: MMM CV: RRR PULM: Decreased air entry bilaterally, diffuse bilateral expiratory wheezing ABD: soft, obese, NT, +BS CNS: AAO x 3, non focal EXT: No edema or tenderness        Data Reviewed:   I have personally reviewed following labs and imaging studies:  Labs: Labs show the following:   Basic Metabolic Panel: Recent Labs  Lab 03/26/23 1825 03/27/23 0452 03/29/23 0618  NA 133* 132* 136  K 3.9 3.9 4.0  CL 96* 98 100  CO2 26 26 29   GLUCOSE 103* 162* 98  BUN 16 19 31*  CREATININE 1.08* 1.18* 1.01*  CALCIUM 8.8* 8.5* 9.1   GFR Estimated Creatinine Clearance: 54.4 mL/min (A) (by C-G formula based on SCr of 1.01 mg/dL (H)). Liver Function Tests: No results for input(s): "AST", "ALT", "ALKPHOS", "BILITOT", "PROT", "ALBUMIN" in the last 168 hours. No results for input(s): "LIPASE", "AMYLASE" in the last 168 hours. No results for input(s): "AMMONIA" in the last 168 hours. Coagulation profile Recent Labs  Lab 03/26/23 1825 03/27/23 0452  INR 1.1 1.1    CBC: Recent Labs  Lab 03/26/23 1825 03/27/23 0452 03/29/23 0618  WBC 8.1 7.6 12.3*  HGB 11.6* 11.3* 11.1*  HCT 35.4* 35.3* 34.1*  MCV 88.5 87.4  87.0  PLT 305 313 331   Cardiac Enzymes: No results for input(s): "CKTOTAL", "CKMB", "CKMBINDEX", "TROPONINI" in the last 168 hours. BNP (last 3 results) No results for input(s): "PROBNP" in the last 8760 hours. CBG: No results for input(s): "GLUCAP" in the last 168 hours. D-Dimer: No results for input(s): "DDIMER" in the last 72 hours. Hgb A1c: No results for input(s): "HGBA1C" in the last 72 hours.  Lipid Profile: No results for input(s): "CHOL", "HDL", "LDLCALC", "TRIG", "CHOLHDL", "LDLDIRECT" in the last 72 hours. Thyroid function studies: No results for input(s): "TSH", "T4TOTAL", "T3FREE", "THYROIDAB" in the last 72 hours.  Invalid input(s): "FREET3" Anemia work up: No results for input(s): "VITAMINB12", "FOLATE", "FERRITIN", "TIBC", "IRON", "RETICCTPCT" in the last 72 hours. Sepsis Labs: Recent Labs  Lab 03/26/23 1825 03/27/23 0452 03/29/23 0618  PROCALCITON  --  0.22  --   WBC 8.1 7.6 12.3*    Microbiology Recent Results (from the past 240 hours)  Resp panel by RT-PCR (RSV, Flu A&B, Covid) Anterior Nasal Swab     Status: Abnormal   Collection Time: 03/26/23  7:21 PM   Specimen: Anterior Nasal Swab  Result Value Ref Range Status   SARS Coronavirus 2 by RT PCR NEGATIVE NEGATIVE Final    Comment: (NOTE) SARS-CoV-2 target nucleic acids are NOT DETECTED.  The SARS-CoV-2 RNA is generally detectable in upper respiratory specimens during the acute phase of infection. The lowest concentration of SARS-CoV-2 viral copies this assay can detect is 138 copies/mL. A negative result does not preclude SARS-Cov-2 infection and should not be used as the sole basis for treatment or other patient management decisions. A negative result may occur with  improper specimen collection/handling, submission of specimen other than nasopharyngeal swab, presence of viral mutation(s) within the areas targeted by this assay, and inadequate number of viral copies(<138 copies/mL). A negative  result must be combined with clinical observations, patient history, and epidemiological information. The expected result is Negative.  Fact Sheet for Patients:  BloggerCourse.com  Fact Sheet for Healthcare Providers:  SeriousBroker.it  This test is no t yet approved or cleared by the [place] FDA and  has been authorized for detection and/or diagnosis of SARS-CoV-2 by FDA under an Emergency Use Authorization (EUA). This EUA will remain  in effect (meaning this test can be used) for the duration of the COVID-19 declaration under Section 564(b)(1) of the Act, 21 U.S.C.section 360bbb-3(b)(1), unless the authorization is terminated  or revoked sooner.       Influenza A by PCR POSITIVE (A) NEGATIVE Final   Influenza B by PCR NEGATIVE NEGATIVE Final    Comment: (NOTE) The Xpert Xpress SARS-CoV-2/FLU/RSV plus assay is intended as an aid in the diagnosis of influenza from Nasopharyngeal swab specimens and should not be used as a sole basis for treatment. Nasal washings and aspirates are unacceptable for Xpert Xpress SARS-CoV-2/FLU/RSV testing.  Fact Sheet for Patients: BloggerCourse.com  Fact Sheet for Healthcare Providers: SeriousBroker.it  This test is not yet approved or cleared by the [place] FDA and has been authorized for detection and/or diagnosis of SARS-CoV-2 by FDA under an Emergency Use Authorization (EUA). This EUA will remain in effect (meaning this test can be used) for the duration of the COVID-19 declaration under Section 564(b)(1) of the Act, 21 U.S.C. section 360bbb-3(b)(1), unless the authorization is terminated or revoked.     Resp Syncytial Virus by PCR NEGATIVE NEGATIVE Final    Comment: (NOTE) Fact Sheet for Patients: BloggerCourse.com  Fact Sheet for Healthcare  Providers: SeriousBroker.it  This test is not yet approved or cleared by the [place] FDA and has been authorized for detection and/or diagnosis of SARS-CoV-2 by FDA under an Emergency Use Authorization (EUA). This EUA will remain in effect (meaning this test can be used) for the duration of the COVID-19 declaration under Section 564(b)(1) of the Act, 21 U.S.C. section 360bbb-3(b)(1), unless the authorization is terminated or revoked.  Performed at [facility], [address] [place], [place] [address]     Procedures and diagnostic studies:  No results found.             LOS: 4 days   Emmerich Cryer  [facility]   Pager on [email or web address]. If 7PM-7AM, please contact night-coverage at [email or web address]     03/30/2023, 12:41 PM

## 2023-03-31 DIAGNOSIS — J441 Chronic obstructive pulmonary disease with (acute) exacerbation: Secondary | ICD-10-CM | POA: Diagnosis not present

## 2023-03-31 MED ORDER — IPRATROPIUM-ALBUTEROL 0.5-2.5 (3) MG/3ML IN SOLN: 3.0000 mL | Freq: Four times a day (QID) | RESPIRATORY_TRACT | 0 refills | Status: AC | PRN

## 2023-03-31 NOTE — Plan of Care (Signed)

## 2023-03-31 NOTE — TOC Transition Note (Signed)
 Transition of Care [facility]) - Discharge Note   Patient Details  Name: Joan [name] MRN: [id number] Date of Birth: 01/07/1951  [facility] CM/SW Contact:  Bing Quarry, RN Phone Number: 03/31/2023, 1:14 PM   Clinical Narrative:  3/8: Home with DME nebulizer for home, and DME home oxygen 2L/[place] portable and stationary, new set up. Contact [employer] via [employer] after speaking with patient on agency preference. Brother will transport home. PCP confirmed. Discharge order for today. No other concerns regarding discharge plan voiced. No SDOH alerts this admission noted.      Final next level of care: Home/Self Care Barriers to Discharge: No Barriers Identified   Patient Goals and CMS Choice     Choice offered to / list presented to : Patient      Discharge Placement                       Discharge Plan and Services Additional resources added to the After Visit Summary for                  DME Arranged: Oxygen, Nebulizer machine DME Agency: [employer] Date DME Agency Contacted: 03/31/23 Time DME Agency Contacted: 1314 Representative spoke with at DME Agency: [facility] HH Arranged: NA HH Agency: NA        Social Drivers of Health (SDOH) Interventions SDOH Screenings   Food Insecurity: No Food Insecurity (03/26/2023)  Housing: Low Risk  (03/26/2023)  Transportation Needs: No Transportation Needs (03/26/2023)  Utilities: Not At Risk (03/26/2023)  Financial Resource Strain: Low Risk  (03/26/2023)   Received from [facility]  Social Connections: Unknown (03/27/2023)  Tobacco Use: Medium Risk (03/26/2023)   Received from [facility]     Readmission Risk Interventions     No data to display

## 2023-03-31 NOTE — Discharge Summary (Signed)
 Physician Discharge Summary   Patient: Joan [name] MRN: [id number] DOB: December 30, 1950  Admit date:     03/26/2023  Discharge date: 03/31/23  Discharge Physician: Lurene Shadow   PCP: Barbette Reichmann, MD   Recommendations at discharge:   Follow-up with PCP in 1 week  Discharge Diagnoses: Principal Problem:   COPD with acute exacerbation (HCC) Active Problems:   Hyponatremia syndrome   Tobacco user   Benign essential hypertension   Anxiety and depression   Troponin I above reference range   COPD exacerbation (HCC)   Acute on chronic respiratory failure with hypoxia (HCC)  Resolved Problems:   * No resolved hospital problems. *  Hospital Course:  Joan [name] is a 73 y.o. female with past medical history significant for right breast cancer, anxiety, hypertension, COPD, peripheral vascular disease, TIA presented to the emergency department for evaluation of shortness of breath, chest tightness on exertion. Patient was noted to be hypoxic requiring 2 L supplemental oxygen. She tested positive for influenza A. Chest x-ray question infiltrate versus atelectasis. Admitted to the hospitalist service for further management evaluation of COPD exacerbation.     Assessment and Plan:   COPD with acute exacerbation (HCC) Influenza A infection Completed 5-day course of steroids Completed 5-day course of Tamiflu Completed 3 days of IV ceftriaxone and azithromycin Nebulizer and DuoNeb have been prescribed.    Acute on chronic hypoxic respiratory failure Oxygen saturation on room air at rest 94%, oxygen saturation on room air while ambulating 87% and oxygen saturation on 2 L of oxygen while ambulating 95%. Patient will be discharged on 2 L/min oxygen for chronic hypoxic respiratory failure. Follow-up with PCP to to assess need for home oxygen periodically    Elevated Troponin Prior troponins reviewed were elevated. Elevated troponins in the setting of hypoxia. Echocardiogram EF  normal, no wall motion abnormalities. Continue aspirin, Plavix and statin     Comorbidities include hypertension, PVD, GERD, anxiety, class I obesity   Her condition has improved and she is deemed stable for discharge to home today.     Consultants: None Procedures performed: None  Disposition: Home Diet recommendation:  Discharge Diet Orders (From admission, onward)     Start     Ordered   03/31/23 0000  Diet - low sodium heart healthy        03/31/23 1028           Cardiac diet DISCHARGE MEDICATION: Allergies as of 03/31/2023       Reactions   Seasonal Ic [cholestatin]    Seasonal allergies        Medication List     TAKE these medications    acetaminophen 325 MG tablet Commonly known as: TYLENOL Take 1-2 tablets (325-650 mg total) by mouth every 4 (four) hours as needed for mild pain (or temp >/= 101 F).   albuterol 108 (90 Base) MCG/ACT inhaler Commonly known as: VENTOLIN HFA Inhale 2 puffs into the lungs every 6 (six) hours as needed for wheezing or shortness of breath.   ALPRAZolam 0.5 MG tablet Commonly known as: XANAX Take 0.5 mg by mouth at bedtime as needed for sleep.   amLODipine 10 MG tablet Commonly known as: NORVASC Take 1 tablet (10 mg total) by mouth daily. Skip the dose if Systolic BP less than 130 mmHg   aspirin EC 81 MG tablet Take 1 tablet (81 mg total) by mouth daily.   clopidogrel 75 MG tablet Commonly known as: Plavix Take 1 tablet (75 mg total)  by mouth daily.   fluticasone furoate-vilanterol 200-25 MCG/ACT Aepb Commonly known as: BREO ELLIPTA Inhale 1 puff into the lungs daily.   ipratropium-albuterol 0.5-2.5 (3) MG/3ML Soln Commonly known as: DUONEB Take 3 mLs by nebulization every 6 (six) hours as needed (wheezing or shortness of breath).   loratadine 10 MG tablet Commonly known as: CLARITIN Take by mouth daily as needed for allergies.   losartan 50 MG tablet Commonly known as: COZAAR Take 50 mg by mouth daily.    pantoprazole 40 MG tablet Commonly known as: PROTONIX Take 1 tablet (40 mg total) by mouth daily.   rosuvastatin 5 MG tablet Commonly known as: CRESTOR Take 5 mg by mouth daily.   sertraline 100 MG tablet Commonly known as: ZOLOFT Take 100 mg by mouth daily.               Durable Medical Equipment  (From admission, onward)           Start     Ordered   03/31/23 1026  DME Oxygen  Once       Question Answer Comment  Length of Need Lifetime   Mode or (Route) Nasal cannula   Liters per Minute 2   Frequency Continuous (stationary and portable oxygen unit needed)   Oxygen conserving device Yes   Oxygen delivery system Gas      03/31/23 1028   03/31/23 0000  For home use only DME Nebulizer machine       Question Answer Comment  Patient needs a nebulizer to treat with the following condition COPD exacerbation (HCC)   Length of Need Lifetime   Additional equipment included Administration kit      03/31/23 1028            Discharge Exam: Filed Weights   03/26/23 1819 03/26/23 2253  Weight: 86.2 kg 84.3 kg   GEN: NAD SKIN: Warm and dry EYES: No pallor or icterus ENT: MMM CV: RRR PULM: Air entry improved bilaterally.  Occasional expiratory wheezing heard. ABD: soft, ND, NT, +BS CNS: AAO x 3, non focal EXT: No edema or tenderness   Condition at discharge: good  The results of significant diagnostics from this hospitalization (including imaging, microbiology, ancillary and laboratory) are listed below for reference.   Imaging Studies: ECHOCARDIOGRAM COMPLETE Result Date: 03/27/2023    ECHOCARDIOGRAM REPORT   Patient Name:   Joan [name] Date of Exam: 03/27/2023 Medical Rec #:  [id number]      Height:       64.0 in Accession #:    [id number]     Weight:       185.8 lb Date of Birth:  1950-06-02      BSA:          1.896 m Patient Age:    72 years       BP:           142/62 mmHg Patient Gender: F              HR:           85 bpm. Exam Location:  [facility]  Procedure: 2D Echo, Cardiac Doppler, Color Doppler and Strain Analysis (Both            Spectral and Color Flow Doppler were utilized during procedure). Indications:     NSTEMI  History:         Patient has prior history of Echocardiogram examinations, most  recent 09/15/2021. Acute MI, PAD, COPD and TIA; Risk                  Factors:Hypertension, Dyslipidemia and Current Smoker. ETOH                  abuse, Breast CA, Influenza +.  Sonographer:     Mikki Harbor Referring Phys:  [id number] [facility] GOEL Diagnosing Phys: Julien Nordmann MD  Sonographer Comments: Technically difficult study due to poor echo windows and suboptimal parasternal window. Image acquisition challenging due to COPD. IMPRESSIONS  1. Left ventricular ejection fraction, by estimation, is 60 to 65%. The left ventricle has normal function. The left ventricle has no regional wall motion abnormalities. Left ventricular diastolic parameters were normal. Suboptimal strain pattern tracking.  2. Right ventricular systolic function is normal. The right ventricular size is normal.  3. The mitral valve is normal in structure. Mild to moderate mitral valve regurgitation. No evidence of mitral stenosis.  4. The aortic valve has an indeterminant number of cusps. Aortic valve regurgitation is not visualized. No aortic stenosis is present.  5. The inferior vena cava is normal in size with greater than 50% respiratory variability, suggesting right atrial pressure of 3 mmHg. FINDINGS  Left Ventricle: Left ventricular ejection fraction, by estimation, is 60 to 65%. The left ventricle has normal function. The left ventricle has no regional wall motion abnormalities. Global longitudinal strain performed but not reported based on interpreter judgement due to suboptimal tracking. The left ventricular internal cavity size was normal in size. There is no left ventricular hypertrophy. Left ventricular diastolic parameters were normal. Right Ventricle: The  right ventricular size is normal. No increase in right ventricular wall thickness. Right ventricular systolic function is normal. Left Atrium: Left atrial size was normal in size. Right Atrium: Right atrial size was normal in size. Pericardium: There is no evidence of pericardial effusion. Mitral Valve: The mitral valve is normal in structure. There is mild calcification of the mitral valve leaflet(s). Mild mitral annular calcification. Mild to moderate mitral valve regurgitation. No evidence of mitral valve stenosis. MV peak gradient, 7.2  mmHg. The mean mitral valve gradient is 3.0 mmHg. Tricuspid Valve: The tricuspid valve is normal in structure. Tricuspid valve regurgitation is not demonstrated. No evidence of tricuspid stenosis. Aortic Valve: The aortic valve has an indeterminant number of cusps. Aortic valve regurgitation is not visualized. No aortic stenosis is present. Aortic valve mean gradient measures 6.0 mmHg. Aortic valve peak gradient measures 12.1 mmHg. Aortic valve area, by VTI measures 2.44 cm. Pulmonic Valve: The pulmonic valve was normal in structure. Pulmonic valve regurgitation is not visualized. No evidence of pulmonic stenosis. Aorta: The aortic root is normal in size and structure. Venous: The inferior vena cava is normal in size with greater than 50% respiratory variability, suggesting right atrial pressure of 3 mmHg. IAS/Shunts: No atrial level shunt detected by color flow Doppler. Additional Comments: 3D was performed not requiring image post processing on an independent workstation and was indeterminate.  LEFT VENTRICLE PLAX 2D LVIDd:         5.00 cm     Diastology LVIDs:         2.50 cm     LV e' medial:    14.80 cm/s LV PW:         1.10 cm     LV E/e' medial:  8.4 LV IVS:        1.20 cm     LV e' lateral:  9.03 cm/s LVOT diam:     2.00 cm     LV E/e' lateral: 13.7 LV SV:         92 LV SV Index:   48          2D Longitudinal Strain LVOT Area:     3.14 cm    2D Strain GLS Avg:     -8.1  %  LV Volumes (MOD) LV vol d, MOD A2C: 47.0 ml LV vol d, MOD A4C: 52.6 ml LV vol s, MOD A2C: 17.4 ml LV vol s, MOD A4C: 23.9 ml LV SV MOD A2C:     29.6 ml LV SV MOD A4C:     52.6 ml LV SV MOD BP:      28.6 ml RIGHT VENTRICLE RV Basal diam:  3.40 cm RV Mid diam:    3.10 cm RV S prime:     18.00 cm/s TAPSE (M-mode): 3.0 cm LEFT ATRIUM             Index        RIGHT ATRIUM           Index LA diam:        4.00 cm 2.11 cm/m   RA Area:     16.30 cm LA Vol (A2C):   62.0 ml 32.70 ml/m  RA Volume:   44.00 ml  23.20 ml/m LA Vol (A4C):   42.8 ml 22.57 ml/m LA Biplane Vol: 54.0 ml 28.48 ml/m  AORTIC VALVE                     PULMONIC VALVE AV Area (Vmax):    2.60 cm      PV Vmax:       1.39 m/s AV Area (Vmean):   2.35 cm      PV Peak grad:  7.7 mmHg AV Area (VTI):     2.44 cm AV Vmax:           174.00 cm/s AV Vmean:          111.000 cm/s AV VTI:            0.376 m AV Peak Grad:      12.1 mmHg AV Mean Grad:      6.0 mmHg LVOT Vmax:         144.00 cm/s LVOT Vmean:        82.900 cm/s LVOT VTI:          0.292 m LVOT/AV VTI ratio: 0.78  AORTA Ao Root diam: 3.20 cm MITRAL VALVE MV Area (PHT): 5.13 cm     SHUNTS MV Area VTI:   2.78 cm     Systemic VTI:  0.29 m MV Peak grad:  7.2 mmHg     Systemic Diam: 2.00 cm MV Mean grad:  3.0 mmHg MV Vmax:       1.34 m/s MV Vmean:      82.4 cm/s MV Decel Time: 148 msec MV E velocity: 124.00 cm/s MV A velocity: 104.00 cm/s MV E/A ratio:  1.19 Julien Nordmann MD Electronically signed by Julien Nordmann MD Signature Date/Time: 03/27/2023/4:08:43 PM    Final    DG Chest 2 View Result Date: 03/26/2023 CLINICAL DATA:  Cough and shortness of breath. EXAM: CHEST - 2 VIEW COMPARISON:  September 14, 2021 FINDINGS: The heart size and mediastinal contours are within normal limits. Very mild atelectasis and/or infiltrate is noted within the left lung base. There is no evidence of a pleural  effusion or pneumothorax. Radiopaque surgical clips are seen within the right upper quadrant. The visualized skeletal  structures are unremarkable. IMPRESSION: Very mild left basilar atelectasis and/or infiltrate. A component of overlying breast attenuation artifact cannot be excluded. Follow-up to resolution is recommended. Electronically Signed   By: Aram Candela M.D.   On: 03/26/2023 20:18    Microbiology: Results for orders placed or performed during the hospital encounter of 03/26/23  Resp panel by RT-PCR (RSV, Flu A&B, Covid) Anterior Nasal Swab     Status: Abnormal   Collection Time: 03/26/23  7:21 PM   Specimen: Anterior Nasal Swab  Result Value Ref Range Status   SARS Coronavirus 2 by RT PCR NEGATIVE NEGATIVE Final    Comment: (NOTE) SARS-CoV-2 target nucleic acids are NOT DETECTED.  The SARS-CoV-2 RNA is generally detectable in upper respiratory specimens during the acute phase of infection. The lowest concentration of SARS-CoV-2 viral copies this assay can detect is 138 copies/mL. A negative result does not preclude SARS-Cov-2 infection and should not be used as the sole basis for treatment or other patient management decisions. A negative result may occur with  improper specimen collection/handling, submission of specimen other than nasopharyngeal swab, presence of viral mutation(s) within the areas targeted by this assay, and inadequate number of viral copies(<138 copies/mL). A negative result must be combined with clinical observations, patient history, and epidemiological information. The expected result is Negative.  Fact Sheet for Patients:  BloggerCourse.com  Fact Sheet for Healthcare Providers:  SeriousBroker.it  This test is no t yet approved or cleared by the [place] FDA and  has been authorized for detection and/or diagnosis of SARS-CoV-2 by FDA under an Emergency Use Authorization (EUA). This EUA will remain  in effect (meaning this test can be used) for the duration of the COVID-19 declaration under Section  564(b)(1) of the Act, 21 U.S.C.section 360bbb-3(b)(1), unless the authorization is terminated  or revoked sooner.       Influenza A by PCR POSITIVE (A) NEGATIVE Final   Influenza B by PCR NEGATIVE NEGATIVE Final    Comment: (NOTE) The Xpert Xpress SARS-CoV-2/FLU/RSV plus assay is intended as an aid in the diagnosis of influenza from Nasopharyngeal swab specimens and should not be used as a sole basis for treatment. Nasal washings and aspirates are unacceptable for Xpert Xpress SARS-CoV-2/FLU/RSV testing.  Fact Sheet for Patients: BloggerCourse.com  Fact Sheet for Healthcare Providers: SeriousBroker.it  This test is not yet approved or cleared by the [place] FDA and has been authorized for detection and/or diagnosis of SARS-CoV-2 by FDA under an Emergency Use Authorization (EUA). This EUA will remain in effect (meaning this test can be used) for the duration of the COVID-19 declaration under Section 564(b)(1) of the Act, 21 U.S.C. section 360bbb-3(b)(1), unless the authorization is terminated or revoked.     Resp Syncytial Virus by PCR NEGATIVE NEGATIVE Final    Comment: (NOTE) Fact Sheet for Patients: BloggerCourse.com  Fact Sheet for Healthcare Providers: SeriousBroker.it  This test is not yet approved or cleared by the [place] FDA and has been authorized for detection and/or diagnosis of SARS-CoV-2 by FDA under an Emergency Use Authorization (EUA). This EUA will remain in effect (meaning this test can be used) for the duration of the COVID-19 declaration under Section 564(b)(1) of the Act, 21 U.S.C. section 360bbb-3(b)(1), unless the authorization is terminated or revoked.  Performed at [facility], [address] [place], [place] [address]     Labs: CBC: Recent Labs  Lab 03/26/23 1825 03/27/23 0452 03/29/23 0618  WBC 8.1 7.6 12.3*  HGB  11.6* 11.3* 11.1*  HCT 35.4* 35.3* 34.1*  MCV 88.5 87.4 87.0  PLT 305 313 331   Basic Metabolic Panel: Recent Labs  Lab 03/26/23 1825 03/27/23 0452 03/29/23 0618  NA 133* 132* 136  K 3.9 3.9 4.0  CL 96* 98 100  CO2 26 26 29   GLUCOSE 103* 162* 98  BUN 16 19 31*  CREATININE 1.08* 1.18* 1.01*  CALCIUM 8.8* 8.5* 9.1   Liver Function Tests: No results for input(s): "AST", "ALT", "ALKPHOS", "BILITOT", "PROT", "ALBUMIN" in the last 168 hours. CBG: No results for input(s): "GLUCAP" in the last 168 hours.  Discharge time spent: greater than 30 minutes.  Signed: Lurene Shadow, MD [facility] 03/31/2023

## 2023-03-31 NOTE — Progress Notes (Signed)
SATURATION QUALIFICATIONS: (This note is used to comply with regulatory documentation for home oxygen)  Patient Saturations on Room Air at Rest = 94%  Patient Saturations on Room Air while Ambulating = 87%  Patient Saturations on 2 Liters of oxygen while Ambulating = 95%  Please briefly explain why patient needs home oxygen: 

## 2023-04-08 DIAGNOSIS — J441 Chronic obstructive pulmonary disease with (acute) exacerbation: Secondary | ICD-10-CM | POA: Diagnosis not present

## 2023-04-08 DIAGNOSIS — Z87891 Personal history of nicotine dependence: Secondary | ICD-10-CM | POA: Diagnosis not present

## 2023-04-08 DIAGNOSIS — J111 Influenza due to unidentified influenza virus with other respiratory manifestations: Secondary | ICD-10-CM | POA: Diagnosis not present

## 2023-04-08 DIAGNOSIS — I10 Essential (primary) hypertension: Secondary | ICD-10-CM | POA: Diagnosis not present

## 2023-04-16 DIAGNOSIS — I739 Peripheral vascular disease, unspecified: Secondary | ICD-10-CM | POA: Diagnosis not present

## 2023-04-16 DIAGNOSIS — Z87891 Personal history of nicotine dependence: Secondary | ICD-10-CM | POA: Diagnosis not present

## 2023-04-16 DIAGNOSIS — R7309 Other abnormal glucose: Secondary | ICD-10-CM | POA: Diagnosis not present

## 2023-04-16 DIAGNOSIS — W010XXA Fall on same level from slipping, tripping and stumbling without subsequent striking against object, initial encounter: Secondary | ICD-10-CM | POA: Diagnosis not present

## 2023-04-16 DIAGNOSIS — F3340 Major depressive disorder, recurrent, in remission, unspecified: Secondary | ICD-10-CM | POA: Diagnosis not present

## 2023-04-16 DIAGNOSIS — F32A Depression, unspecified: Secondary | ICD-10-CM | POA: Diagnosis not present

## 2023-04-16 DIAGNOSIS — S42295A Other nondisplaced fracture of upper end of left humerus, initial encounter for closed fracture: Secondary | ICD-10-CM | POA: Diagnosis not present

## 2023-04-16 DIAGNOSIS — I10 Essential (primary) hypertension: Secondary | ICD-10-CM | POA: Diagnosis not present

## 2023-04-16 DIAGNOSIS — M25512 Pain in left shoulder: Secondary | ICD-10-CM | POA: Diagnosis not present

## 2023-04-16 DIAGNOSIS — J449 Chronic obstructive pulmonary disease, unspecified: Secondary | ICD-10-CM | POA: Diagnosis not present

## 2023-05-14 DIAGNOSIS — S42295A Other nondisplaced fracture of upper end of left humerus, initial encounter for closed fracture: Secondary | ICD-10-CM | POA: Diagnosis not present

## 2023-06-04 DIAGNOSIS — S42295A Other nondisplaced fracture of upper end of left humerus, initial encounter for closed fracture: Secondary | ICD-10-CM | POA: Diagnosis not present

## 2023-06-11 ENCOUNTER — Encounter (INDEPENDENT_AMBULATORY_CARE_PROVIDER_SITE_OTHER): Payer: Self-pay

## 2023-06-25 ENCOUNTER — Other Ambulatory Visit: Payer: Self-pay | Admitting: *Deleted

## 2023-06-25 DIAGNOSIS — N200 Calculus of kidney: Secondary | ICD-10-CM

## 2023-07-01 ENCOUNTER — Ambulatory Visit (INDEPENDENT_AMBULATORY_CARE_PROVIDER_SITE_OTHER): Admitting: Urology

## 2023-07-01 ENCOUNTER — Ambulatory Visit
Admission: RE | Admit: 2023-07-01 | Discharge: 2023-07-01 | Disposition: A | Discharge status: Home / Self Care | Source: Ambulatory Visit | Attending: Urology | Admitting: Urology

## 2023-07-01 ENCOUNTER — Encounter: Payer: Self-pay | Admitting: Urology

## 2023-07-01 ENCOUNTER — Ambulatory Visit
Admission: RE | Admit: 2023-07-01 | Discharge: 2023-07-01 | Disposition: A | Discharge status: Home / Self Care | Attending: Urology | Admitting: Urology

## 2023-07-01 VITALS — BP 124/77 | HR 78 | Ht 64.0 in | Wt 170.0 lb

## 2023-07-01 DIAGNOSIS — N200 Calculus of kidney: Secondary | ICD-10-CM

## 2023-07-01 NOTE — Progress Notes (Signed)
 07/01/2023 1:32 PM   Joan [name] [birth year] [id number]  Referring provider: Antonio Baumgarten, MD [address] [facility] [place],  [place] [address]  Chief Complaint  Patient presents with   Nephrolithiasis   Urologic history:  1.  Microhematuria Initially seen 03/2021 CT urogram bilateral, nonobstructing renal calculi Cystoscopy without bladder mucosal abnormalities  2.  Bilateral nephrolithiasis As above   HPI: Joan [name] is a 73 y.o. female presents for 1 year follow-up.  No complaints since last visit Denies flank, abdominal or pelvic pain Denies dysuria or gross hematuria   PMH: Past Medical History:  Diagnosis Date   Angina at rest [facility])    Anxiety    Arthritis    Breast cancer, right breast (HCC)    s/p lumpectomy and adjuvant XRT   Bronchitis    Cancer (HCC)    Claudication, intermittent (HCC)    Depression    Dyspnea    Environmental and seasonal allergies    GERD (gastroesophageal reflux disease)    with spicy food   Hepatitis C antibody test positive    Hypertension    Peripheral vascular disease (HCC)    Personal history of radiation therapy 2002   RIGHT lumpectomy   TIA (transient ischemic attack)    no residual. Many years ago    Surgical History: Past Surgical History:  Procedure Laterality Date   APPENDECTOMY     BREAST EXCISIONAL BIOPSY Right    11/1999   BREAST LUMPECTOMY Right 2002   RIGHT lumpectomy w/ radiation 2002   BREAST SURGERY     CHOLECYSTECTOMY     ENDARTERECTOMY FEMORAL Bilateral 06/22/2020   Procedure: ENDARTERECTOMY FEMORAL;  Surgeon: Celso College, MD;  Location: [facility] ORS;  Service: Vascular;  Laterality: Bilateral;   INSERTION OF ILIAC STENT Bilateral 06/22/2020   Procedure: INSERTION OF ILIAC STENT;  Surgeon: Celso College, MD;  Location: [facility] ORS;  Service: Vascular;  Laterality: Bilateral;   LOWER EXTREMITY ANGIOGRAPHY Left 11/14/2017   Procedure: LOWER EXTREMITY ANGIOGRAPHY;  Surgeon:  Celso College, MD;  Location: [facility] INVASIVE CV LAB;  Service: Cardiovascular;  Laterality: Left;   LOWER EXTREMITY ANGIOGRAPHY Right 04/21/2020   Procedure: LOWER EXTREMITY ANGIOGRAPHY;  Surgeon: Celso College, MD;  Location: [facility] INVASIVE CV LAB;  Service: Cardiovascular;  Laterality: Right;   LOWER EXTREMITY ANGIOGRAPHY Left 08/15/2020   Procedure: LOWER EXTREMITY ANGIOGRAPHY;  Surgeon: Celso College, MD;  Location: [facility] INVASIVE CV LAB;  Service: Cardiovascular;  Laterality: Left;   LOWER EXTREMITY ANGIOGRAPHY Right 08/22/2020   Procedure: LOWER EXTREMITY ANGIOGRAPHY;  Surgeon: Celso College, MD;  Location: [facility] INVASIVE CV LAB;  Service: Cardiovascular;  Laterality: Right;   LOWER EXTREMITY ANGIOGRAPHY Left 10/17/2022   Procedure: Lower Extremity Angiography;  Surgeon: Celso College, MD;  Location: [facility] INVASIVE CV LAB;  Service: Cardiovascular;  Laterality: Left;   LOWER EXTREMITY ANGIOGRAPHY Right 10/22/2022   Procedure: Lower Extremity Angiography;  Surgeon: Celso College, MD;  Location: [facility] INVASIVE CV LAB;  Service: Cardiovascular;  Laterality: Right;    Home Medications:  Allergies as of 07/01/2023       Reactions   Seasonal Ic [cholestatin]    Seasonal allergies        Medication List        Accurate as of July 01, 2023  1:32 PM. If you have any questions, ask your nurse or doctor.          acetaminophen  325 MG tablet Commonly known as:  TYLENOL  Take 1-2 tablets (325-650 mg total) by mouth every 4 (four) hours as needed for mild pain (or temp >/= 101 F).   albuterol  108 (90 Base) MCG/ACT inhaler Commonly known as: VENTOLIN  HFA Inhale 2 puffs into the lungs every 6 (six) hours as needed for wheezing or shortness of breath.   ALPRAZolam  0.5 MG tablet Commonly known as: XANAX  Take 0.5 mg by mouth at bedtime as needed for sleep.   amLODipine  10 MG tablet Commonly known as: NORVASC  Take 1 tablet (10 mg total) by mouth daily. Skip the dose if Systolic BP less than 130 mmHg    aspirin  EC 81 MG tablet Take 1 tablet (81 mg total) by mouth daily.   clopidogrel  75 MG tablet Commonly known as: Plavix  Take 1 tablet (75 mg total) by mouth daily.   fluticasone  furoate-vilanterol 200-25 MCG/ACT Aepb Commonly known as: BREO ELLIPTA  Inhale 1 puff into the lungs daily.   ipratropium-albuterol  0.5-2.5 (3) MG/3ML Soln Commonly known as: DUONEB Take 3 mLs by nebulization every 6 (six) hours as needed (wheezing or shortness of breath).   loratadine  10 MG tablet Commonly known as: CLARITIN  Take by mouth daily as needed for allergies.   losartan  50 MG tablet Commonly known as: COZAAR  Take 50 mg by mouth daily.   pantoprazole  40 MG tablet Commonly known as: PROTONIX  Take 1 tablet (40 mg total) by mouth daily.   rosuvastatin  5 MG tablet Commonly known as: CRESTOR  Take 5 mg by mouth daily.   sertraline  100 MG tablet Commonly known as: ZOLOFT  Take 100 mg by mouth daily.   traMADol 50 MG tablet Commonly known as: ULTRAM Take 50 mg by mouth every 6 (six) hours as needed.        Allergies:  Allergies  Allergen Reactions   Seasonal Ic [Cholestatin]     Seasonal allergies    Family History: Family History  Problem Relation Age of Onset   Breast cancer Mother 70   Breast cancer Maternal Aunt    Breast cancer Maternal Grandmother    Breast cancer Sister 37    Social History:  reports that she has quit smoking. Her smoking use included cigarettes. She has a 25 pack-year smoking history. She has never used smokeless tobacco. She reports that she does not currently use alcohol . She reports that she does not use drugs.   Physical Exam: BP 124/77   Pulse 78   Ht 5\' 4"  (1.626 m)   Wt 170 lb (77.1 kg)   BMI 29.18 kg/m   Constitutional:  Alert and oriented, No acute distress. HEENT: [place] AT Respiratory: Normal respiratory effort, no increased work of breathing. Psychiatric: Normal mood and affect.     Pertinent Imaging: KUB performed earlier today  was personally reviewed and interpreted.  There are faint calcifications overlying the renal outlines.  The x-ray has not been interpreted by radiology   Assessment & Plan:    1.  Bilateral nephrolithiasis Small, nonobstructing renal calculi 1 year follow-up with KUB If stable x-ray next year will see prn   Geraline Knapp, MD  [facility] [address] [place], [place] [address] [phone]

## 2023-07-08 DIAGNOSIS — G8929 Other chronic pain: Secondary | ICD-10-CM | POA: Diagnosis not present

## 2023-07-08 DIAGNOSIS — M25512 Pain in left shoulder: Secondary | ICD-10-CM | POA: Diagnosis not present

## 2023-07-09 DIAGNOSIS — S42295D Other nondisplaced fracture of upper end of left humerus, subsequent encounter for fracture with routine healing: Secondary | ICD-10-CM | POA: Diagnosis not present

## 2023-07-11 DIAGNOSIS — D649 Anemia, unspecified: Secondary | ICD-10-CM | POA: Diagnosis not present

## 2023-07-11 DIAGNOSIS — F3340 Major depressive disorder, recurrent, in remission, unspecified: Secondary | ICD-10-CM | POA: Diagnosis not present

## 2023-07-11 DIAGNOSIS — I739 Peripheral vascular disease, unspecified: Secondary | ICD-10-CM | POA: Diagnosis not present

## 2023-07-11 DIAGNOSIS — J449 Chronic obstructive pulmonary disease, unspecified: Secondary | ICD-10-CM | POA: Diagnosis not present

## 2023-07-11 DIAGNOSIS — Z853 Personal history of malignant neoplasm of breast: Secondary | ICD-10-CM | POA: Diagnosis not present

## 2023-07-11 DIAGNOSIS — E538 Deficiency of other specified B group vitamins: Secondary | ICD-10-CM | POA: Diagnosis not present

## 2023-07-11 DIAGNOSIS — R7309 Other abnormal glucose: Secondary | ICD-10-CM | POA: Diagnosis not present

## 2023-07-11 DIAGNOSIS — R7303 Prediabetes: Secondary | ICD-10-CM | POA: Diagnosis not present

## 2023-07-11 DIAGNOSIS — I10 Essential (primary) hypertension: Secondary | ICD-10-CM | POA: Diagnosis not present

## 2023-07-15 DIAGNOSIS — M25512 Pain in left shoulder: Secondary | ICD-10-CM | POA: Diagnosis not present

## 2023-07-15 DIAGNOSIS — G8929 Other chronic pain: Secondary | ICD-10-CM | POA: Diagnosis not present

## 2023-07-17 DIAGNOSIS — G8929 Other chronic pain: Secondary | ICD-10-CM | POA: Diagnosis not present

## 2023-07-17 DIAGNOSIS — M25512 Pain in left shoulder: Secondary | ICD-10-CM | POA: Diagnosis not present

## 2023-07-18 DIAGNOSIS — Z1331 Encounter for screening for depression: Secondary | ICD-10-CM | POA: Diagnosis not present

## 2023-07-18 DIAGNOSIS — I10 Essential (primary) hypertension: Secondary | ICD-10-CM | POA: Diagnosis not present

## 2023-07-18 DIAGNOSIS — I739 Peripheral vascular disease, unspecified: Secondary | ICD-10-CM | POA: Diagnosis not present

## 2023-07-18 DIAGNOSIS — F32A Depression, unspecified: Secondary | ICD-10-CM | POA: Diagnosis not present

## 2023-07-18 DIAGNOSIS — R7309 Other abnormal glucose: Secondary | ICD-10-CM | POA: Diagnosis not present

## 2023-07-18 DIAGNOSIS — J449 Chronic obstructive pulmonary disease, unspecified: Secondary | ICD-10-CM | POA: Diagnosis not present

## 2023-07-18 DIAGNOSIS — Z853 Personal history of malignant neoplasm of breast: Secondary | ICD-10-CM | POA: Diagnosis not present

## 2023-07-18 DIAGNOSIS — K219 Gastro-esophageal reflux disease without esophagitis: Secondary | ICD-10-CM | POA: Diagnosis not present

## 2023-07-18 DIAGNOSIS — Z0000 Encounter for general adult medical examination without abnormal findings: Secondary | ICD-10-CM | POA: Diagnosis not present

## 2023-07-22 ENCOUNTER — Encounter: Payer: Self-pay | Admitting: Internal Medicine

## 2023-07-22 ENCOUNTER — Other Ambulatory Visit: Payer: Self-pay | Admitting: Internal Medicine

## 2023-07-22 DIAGNOSIS — Z87891 Personal history of nicotine dependence: Secondary | ICD-10-CM

## 2023-07-22 DIAGNOSIS — F17210 Nicotine dependence, cigarettes, uncomplicated: Secondary | ICD-10-CM

## 2023-07-22 DIAGNOSIS — M8588 Other specified disorders of bone density and structure, other site: Secondary | ICD-10-CM | POA: Diagnosis not present

## 2023-07-22 DIAGNOSIS — Z1331 Encounter for screening for depression: Secondary | ICD-10-CM

## 2023-07-24 DIAGNOSIS — M25512 Pain in left shoulder: Secondary | ICD-10-CM | POA: Diagnosis not present

## 2023-07-24 DIAGNOSIS — G8929 Other chronic pain: Secondary | ICD-10-CM | POA: Diagnosis not present

## 2023-07-29 DIAGNOSIS — M25512 Pain in left shoulder: Secondary | ICD-10-CM | POA: Diagnosis not present

## 2023-07-29 DIAGNOSIS — G8929 Other chronic pain: Secondary | ICD-10-CM | POA: Diagnosis not present

## 2023-07-30 ENCOUNTER — Other Ambulatory Visit: Payer: Self-pay | Admitting: Internal Medicine

## 2023-07-30 DIAGNOSIS — Z1231 Encounter for screening mammogram for malignant neoplasm of breast: Secondary | ICD-10-CM

## 2023-07-31 ENCOUNTER — Ambulatory Visit
Admission: RE | Admit: 2023-07-31 | Discharge: 2023-07-31 | Disposition: A | Discharge status: Home / Self Care | Source: Ambulatory Visit | Attending: Internal Medicine | Admitting: Internal Medicine

## 2023-07-31 DIAGNOSIS — Z122 Encounter for screening for malignant neoplasm of respiratory organs: Secondary | ICD-10-CM | POA: Diagnosis not present

## 2023-07-31 DIAGNOSIS — F17210 Nicotine dependence, cigarettes, uncomplicated: Secondary | ICD-10-CM | POA: Diagnosis not present

## 2023-07-31 DIAGNOSIS — Z1331 Encounter for screening for depression: Secondary | ICD-10-CM | POA: Diagnosis not present

## 2023-07-31 DIAGNOSIS — Z87891 Personal history of nicotine dependence: Secondary | ICD-10-CM | POA: Diagnosis not present

## 2023-08-05 DIAGNOSIS — G8929 Other chronic pain: Secondary | ICD-10-CM | POA: Diagnosis not present

## 2023-08-05 DIAGNOSIS — M25512 Pain in left shoulder: Secondary | ICD-10-CM | POA: Diagnosis not present

## 2023-08-14 DIAGNOSIS — G8929 Other chronic pain: Secondary | ICD-10-CM | POA: Diagnosis not present

## 2023-08-14 DIAGNOSIS — M25512 Pain in left shoulder: Secondary | ICD-10-CM | POA: Diagnosis not present

## 2023-08-16 ENCOUNTER — Ambulatory Visit
Admission: RE | Admit: 2023-08-16 | Discharge: 2023-08-16 | Disposition: A | Discharge status: Home / Self Care | Source: Ambulatory Visit | Attending: Internal Medicine | Admitting: Internal Medicine

## 2023-08-16 DIAGNOSIS — Z1231 Encounter for screening mammogram for malignant neoplasm of breast: Secondary | ICD-10-CM | POA: Diagnosis not present

## 2023-08-20 ENCOUNTER — Ambulatory Visit (INDEPENDENT_AMBULATORY_CARE_PROVIDER_SITE_OTHER): Payer: Medicare HMO | Admitting: Vascular Surgery

## 2023-08-20 ENCOUNTER — Encounter (INDEPENDENT_AMBULATORY_CARE_PROVIDER_SITE_OTHER): Payer: Medicare HMO

## 2023-08-21 ENCOUNTER — Other Ambulatory Visit: Payer: Self-pay | Admitting: Internal Medicine

## 2023-08-21 DIAGNOSIS — R928 Other abnormal and inconclusive findings on diagnostic imaging of breast: Secondary | ICD-10-CM

## 2023-08-22 ENCOUNTER — Ambulatory Visit
Admission: RE | Admit: 2023-08-22 | Discharge: 2023-08-22 | Disposition: A | Discharge status: Home / Self Care | Source: Ambulatory Visit | Attending: Internal Medicine | Admitting: Internal Medicine

## 2023-08-22 DIAGNOSIS — D360 Benign neoplasm of lymph nodes: Secondary | ICD-10-CM | POA: Diagnosis not present

## 2023-08-22 DIAGNOSIS — R928 Other abnormal and inconclusive findings on diagnostic imaging of breast: Secondary | ICD-10-CM | POA: Insufficient documentation

## 2023-09-12 ENCOUNTER — Other Ambulatory Visit (INDEPENDENT_AMBULATORY_CARE_PROVIDER_SITE_OTHER): Payer: Self-pay | Admitting: Nurse Practitioner

## 2023-09-12 DIAGNOSIS — Z9889 Other specified postprocedural states: Secondary | ICD-10-CM

## 2023-09-17 ENCOUNTER — Ambulatory Visit (INDEPENDENT_AMBULATORY_CARE_PROVIDER_SITE_OTHER): Admitting: Vascular Surgery

## 2023-09-17 ENCOUNTER — Ambulatory Visit (INDEPENDENT_AMBULATORY_CARE_PROVIDER_SITE_OTHER)

## 2023-09-17 ENCOUNTER — Encounter (INDEPENDENT_AMBULATORY_CARE_PROVIDER_SITE_OTHER): Payer: Self-pay | Admitting: Vascular Surgery

## 2023-09-17 VITALS — BP 143/77 | HR 68 | Ht 64.0 in | Wt 178.0 lb

## 2023-09-17 DIAGNOSIS — Z9889 Other specified postprocedural states: Secondary | ICD-10-CM | POA: Diagnosis not present

## 2023-09-17 DIAGNOSIS — E785 Hyperlipidemia, unspecified: Secondary | ICD-10-CM

## 2023-09-17 DIAGNOSIS — I10 Essential (primary) hypertension: Secondary | ICD-10-CM

## 2023-09-17 DIAGNOSIS — I739 Peripheral vascular disease, unspecified: Secondary | ICD-10-CM

## 2023-09-17 DIAGNOSIS — I70213 Atherosclerosis of native arteries of extremities with intermittent claudication, bilateral legs: Secondary | ICD-10-CM | POA: Diagnosis not present

## 2023-09-17 NOTE — Assessment & Plan Note (Signed)
 ABIs today are 0.96 on the right and 0.87 on the left with biphasic waveforms and normal digital pressures and waveforms bilaterally.  Perfusion remains adequate almost 1 year after bilateral lower extremity intervention and 3 years status post femoral endarterectomies and iliac intervention.  Continue current medical regimen.  Given her high risk status with multiple previous surgeries and interventions, we will continue 3-month interval follow-ups for the foreseeable future.

## 2023-09-17 NOTE — Assessment & Plan Note (Signed)
 blood pressure control important in reducing the progression of atherosclerotic disease. On appropriate oral medications.

## 2023-09-17 NOTE — Assessment & Plan Note (Signed)
 lipid control important in reducing the progression of atherosclerotic disease. Continue statin therapy

## 2023-09-17 NOTE — Progress Notes (Signed)
 MRN : [id number]  Joan [name] is a 73 y.o. (Oct 05, 1950) female who presents with chief complaint of  Chief Complaint  Patient presents with   Follow-up  .  History of Present Illness: Patient returns today in follow up of her PAD.  She has undergone extensive bilateral lower extremity revascularizations in a staged fashion about a year ago.  About 3 years ago, she underwent bilateral femoral endarterectomies and iliac stent placements.  She overall is doing well.  She currently has no lifestyle-limiting claudication, ischemic rest pain, or ulceration. ABIs today are 0.96 on the right and 0.87 on the left with biphasic waveforms and normal digital pressures and waveforms bilaterally.   Current Outpatient Medications  Medication Sig Dispense Refill   acetaminophen  (TYLENOL ) 325 MG tablet Take 1-2 tablets (325-650 mg total) by mouth every 4 (four) hours as needed for mild pain (or temp >/= 101 F). 25 tablet 1   albuterol  (VENTOLIN  HFA) 108 (90 Base) MCG/ACT inhaler Inhale 2 puffs into the lungs every 6 (six) hours as needed for wheezing or shortness of breath. 8 g 1   ALPRAZolam  (XANAX ) 0.5 MG tablet Take 0.5 mg by mouth at bedtime as needed for sleep.     amLODipine  (NORVASC ) 10 MG tablet Take 1 tablet (10 mg total) by mouth daily. Skip the dose if Systolic BP less than 130 mmHg 90 tablet 3   aspirin  EC 81 MG tablet Take 1 tablet (81 mg total) by mouth daily. 90 tablet 3   clopidogrel  (PLAVIX ) 75 MG tablet Take 1 tablet (75 mg total) by mouth daily. 90 tablet 3   fluticasone  furoate-vilanterol (BREO ELLIPTA ) 200-25 MCG/ACT AEPB Inhale 1 puff into the lungs daily. 30 each 11   ipratropium-albuterol  (DUONEB) 0.5-2.5 (3) MG/3ML SOLN Take 3 mLs by nebulization every 6 (six) hours as needed (wheezing or shortness of breath). 360 mL 0   loratadine  (CLARITIN ) 10 MG tablet Take by mouth daily as needed for allergies.     losartan  (COZAAR ) 50 MG tablet Take 50 mg by mouth daily.     pantoprazole   (PROTONIX ) 40 MG tablet Take 1 tablet (40 mg total) by mouth daily. 30 tablet 2   rosuvastatin  (CRESTOR ) 5 MG tablet Take 5 mg by mouth daily.     sertraline  (ZOLOFT ) 100 MG tablet Take 100 mg by mouth daily.     traMADol (ULTRAM) 50 MG tablet Take 50 mg by mouth every 6 (six) hours as needed.     No current facility-administered medications for this visit.    Past Medical History:  Diagnosis Date   Angina at rest [facility])    Anxiety    Arthritis    Breast cancer, right breast (HCC)    s/p lumpectomy and adjuvant XRT   Bronchitis    Cancer (HCC)    Claudication, intermittent (HCC)    Depression    Dyspnea    Environmental and seasonal allergies    GERD (gastroesophageal reflux disease)    with spicy food   Hepatitis C antibody test positive    Hypertension    Peripheral vascular disease (HCC)    Personal history of radiation therapy 2002   RIGHT lumpectomy   TIA (transient ischemic attack)    no residual. Many years ago    Past Surgical History:  Procedure Laterality Date   APPENDECTOMY     BREAST EXCISIONAL BIOPSY Right    11/1999   BREAST LUMPECTOMY Right 2002   RIGHT lumpectomy w/ radiation 2002  BREAST SURGERY     CHOLECYSTECTOMY     ENDARTERECTOMY FEMORAL Bilateral 06/22/2020   Procedure: ENDARTERECTOMY FEMORAL;  Surgeon: Marea Selinda RAMAN, MD;  Location: [facility];  Service: Vascular;  Laterality: Bilateral;   INSERTION OF ILIAC STENT Bilateral 06/22/2020   Procedure: INSERTION OF ILIAC STENT;  Surgeon: Marea Selinda RAMAN, MD;  Location: [facility];  Service: Vascular;  Laterality: Bilateral;   LOWER EXTREMITY ANGIOGRAPHY Left 11/14/2017   Procedure: LOWER EXTREMITY ANGIOGRAPHY;  Surgeon: Marea Selinda RAMAN, MD;  Location: [facility];  Service: Cardiovascular;  Laterality: Left;   LOWER EXTREMITY ANGIOGRAPHY Right 04/21/2020   Procedure: LOWER EXTREMITY ANGIOGRAPHY;  Surgeon: Marea Selinda RAMAN, MD;  Location: [facility];  Service: Cardiovascular;  Laterality: Right;   LOWER  EXTREMITY ANGIOGRAPHY Left 08/15/2020   Procedure: LOWER EXTREMITY ANGIOGRAPHY;  Surgeon: Marea Selinda RAMAN, MD;  Location: [facility];  Service: Cardiovascular;  Laterality: Left;   LOWER EXTREMITY ANGIOGRAPHY Right 08/22/2020   Procedure: LOWER EXTREMITY ANGIOGRAPHY;  Surgeon: Marea Selinda RAMAN, MD;  Location: [facility];  Service: Cardiovascular;  Laterality: Right;   LOWER EXTREMITY ANGIOGRAPHY Left 10/17/2022   Procedure: Lower Extremity Angiography;  Surgeon: Marea Selinda RAMAN, MD;  Location: [facility];  Service: Cardiovascular;  Laterality: Left;   LOWER EXTREMITY ANGIOGRAPHY Right 10/22/2022   Procedure: Lower Extremity Angiography;  Surgeon: Marea Selinda RAMAN, MD;  Location: [facility];  Service: Cardiovascular;  Laterality: Right;     Social History   Tobacco Use   Smoking status: Former    Current packs/day: 0.50    Average packs/day: 0.5 packs/day for 50.0 years (25.0 ttl pk-yrs)    Types: Cigarettes   Smokeless tobacco: Never   Tobacco comments:    trying to quit on her own  Vaping Use   Vaping status: Never Used  Substance Use Topics   Alcohol  use: Not Currently    Comment: Beer heavy everyday in past - last 10 years ago   Drug use: No      Family History  Problem Relation Age of Onset   Breast cancer Mother 36   Breast cancer Maternal Aunt    Breast cancer Maternal Grandmother    Breast cancer Sister 23     Allergies  Allergen Reactions   Seasonal Ic [Cholestatin]     Seasonal allergies     REVIEW OF SYSTEMS (Negative unless checked)  Constitutional: [] Weight loss  [] Fever  [] Chills Cardiac: [] Chest pain   [] Chest pressure   [] Palpitations   [] Shortness of breath when laying flat   [] Shortness of breath at rest   [] Shortness of breath with exertion. Vascular:  [x] Pain in legs with walking   [] Pain in legs at rest   [] Pain in legs when laying flat   [] Claudication   [] Pain in feet when walking  [] Pain in feet at rest  [] Pain in feet  when laying flat   [] History of DVT   [] Phlebitis   [] Swelling in legs   [] Varicose veins   [] Non-healing ulcers Pulmonary:   [] Uses home oxygen   [] Productive cough   [] Hemoptysis   [] Wheeze  [] COPD   [] Asthma Neurologic:  [] Dizziness  [] Blackouts   [] Seizures   [] History of stroke   [x] History of TIA  [] Aphasia   [] Temporary blindness   [] Dysphagia   [] Weakness or numbness in arms   [] Weakness or numbness in legs Musculoskeletal:  [x] Arthritis   [] Joint swelling   [x] Joint pain   [] Low  back pain Hematologic:  [] Easy bruising  [] Easy bleeding   [] Hypercoagulable state   [] Anemic   Gastrointestinal:  [] Blood in stool   [] Vomiting blood  [x] Gastroesophageal reflux/heartburn   [] Abdominal pain Genitourinary:  [] Chronic kidney disease   [] Difficult urination  [] Frequent urination  [] Burning with urination   [] Hematuria Skin:  [] Rashes   [] Ulcers   [] Wounds Psychological:  [x] History of anxiety   []  History of major depression.  Physical Examination  BP (!) 143/77   Pulse 68   Ht 5' 4 (1.626 m)   Wt 178 lb (80.7 kg)   BMI 30.55 kg/m  Gen:  WD/WN, NAD Head: [place]/AT, No temporalis wasting. Ear/Nose/Throat: Hearing grossly intact, nares w/o erythema or drainage Eyes: Conjunctiva clear. Sclera non-icteric Neck: Supple.  Trachea midline Pulmonary:  Good air movement, no use of accessory muscles.  Cardiac: RRR, no JVD Vascular:  Vessel Right Left  Radial Palpable Palpable                          PT Palpable Palpable  DP Palpable Palpable   Gastrointestinal: soft, non-tender/non-distended. No guarding/reflex.  Musculoskeletal: M/S 5/5 throughout.  No deformity or atrophy. Trace LE edema. Neurologic: Sensation grossly intact in extremities.  Symmetrical.  Speech is fluent.  Psychiatric: Judgment intact, Mood & affect appropriate for pt's clinical situation. Dermatologic: No rashes or ulcers noted.  No cellulitis or open wounds.      Labs No results found for this or any previous  visit (from the past 2160 hours).  Radiology US  AXILLA LEFT Result Date: 08/22/2023 CLINICAL DATA:  Screening recall for possible enlarged LEFT axillary lymph node. The patient denies any recent vaccinations. She does report a recent episode of pneumonia a few months ago. EXAM: ULTRASOUND OF THE LEFT AXILLA COMPARISON:  Previous examinations FINDINGS: Targeted left axillary ultrasound was performed. This demonstrates multiple morphologically benign lymph nodes, with cortical thickness of up to 2 mm. The largest lymph node measures up to 20 mm in length and corresponds with the screening mammogram finding. No evidence of lymphadenopathy. IMPRESSION: Benign LEFT axillary lymph nodes.  No evidence of lymphadenopathy. RECOMMENDATION: Return to routine screening mammography is recommended. The patient will be due for screening in July 2026. I have discussed the findings and recommendations with the patient. If applicable, a reminder letter will be sent to the patient regarding the next appointment. BI-RADS CATEGORY  1: Negative. Electronically Signed   By: Dirk Arrant M.D.   On: 08/22/2023 13:27    Assessment/Plan  Atherosclerosis of native arteries of extremity with intermittent claudication (HCC) ABIs today are 0.96 on the right and 0.87 on the left with biphasic waveforms and normal digital pressures and waveforms bilaterally.  Perfusion remains adequate almost 1 year after bilateral lower extremity intervention and 3 years status post femoral endarterectomies and iliac intervention.  Continue current medical regimen.  Given her high risk status with multiple previous surgeries and interventions, we will continue 36-month interval follow-ups for the foreseeable future.  Essential hypertension blood pressure control important in reducing the progression of atherosclerotic disease. On appropriate oral medications.   Dyslipidemia lipid control important in reducing the progression of atherosclerotic  disease. Continue statin therapy    Selinda Gu, MD  09/17/2023 3:18 PM    This note was created with Dragon medical transcription system.  Any errors from dictation are purely unintentional

## 2023-09-24 LAB — VAS US ABI WITH/WO TBI
Left ABI: 0.87
Right ABI: 0.96

## 2023-11-29 NOTE — Progress Notes (Signed)
 Joan [name]                                          MRN: [id number]   11/29/2023   The [facility] Quality Team Specialist reviewed this patient medical record for the purposes of chart review for care gap closure. The following were reviewed: chart review for care gap closure-controlling blood pressure.    [facility] Quality Team

## 2024-03-17 ENCOUNTER — Encounter (INDEPENDENT_AMBULATORY_CARE_PROVIDER_SITE_OTHER)

## 2024-03-17 ENCOUNTER — Ambulatory Visit (INDEPENDENT_AMBULATORY_CARE_PROVIDER_SITE_OTHER): Admitting: Vascular Surgery

## 2024-07-01 ENCOUNTER — Ambulatory Visit: Admitting: Urology
